# Patient Record
Sex: Female | Born: 1945 | Race: White | Hispanic: No | Smoking: Never smoker
Health system: Southern US, Community
[De-identification: ages and names within clinical notes are randomized; demographics above are authoritative.]

## PROBLEM LIST (undated history)

## (undated) DIAGNOSIS — M25561 Pain in right knee: Secondary | ICD-10-CM

## (undated) DIAGNOSIS — E663 Overweight: Secondary | ICD-10-CM

## (undated) DIAGNOSIS — J45909 Unspecified asthma, uncomplicated: Secondary | ICD-10-CM

## (undated) DIAGNOSIS — E119 Type 2 diabetes mellitus without complications: Secondary | ICD-10-CM

## (undated) DIAGNOSIS — K219 Gastro-esophageal reflux disease without esophagitis: Secondary | ICD-10-CM

## (undated) DIAGNOSIS — M1990 Unspecified osteoarthritis, unspecified site: Secondary | ICD-10-CM

## (undated) DIAGNOSIS — J301 Allergic rhinitis due to pollen: Secondary | ICD-10-CM

## (undated) DIAGNOSIS — R918 Other nonspecific abnormal finding of lung field: Secondary | ICD-10-CM

## (undated) DIAGNOSIS — Z1239 Encounter for other screening for malignant neoplasm of breast: Principal | ICD-10-CM

## (undated) DIAGNOSIS — M545 Low back pain: Secondary | ICD-10-CM

## (undated) DIAGNOSIS — Z8489 Family history of other specified conditions: Secondary | ICD-10-CM

## (undated) DIAGNOSIS — Z0000 Encounter for general adult medical examination without abnormal findings: Secondary | ICD-10-CM

## (undated) DIAGNOSIS — B354 Tinea corporis: Secondary | ICD-10-CM

## (undated) DIAGNOSIS — Z124 Encounter for screening for malignant neoplasm of cervix: Secondary | ICD-10-CM

## (undated) DIAGNOSIS — E1169 Type 2 diabetes mellitus with other specified complication: Secondary | ICD-10-CM

## (undated) DIAGNOSIS — N649 Disorder of breast, unspecified: Secondary | ICD-10-CM

## (undated) DIAGNOSIS — R1310 Dysphagia, unspecified: Secondary | ICD-10-CM

## (undated) DIAGNOSIS — R202 Paresthesia of skin: Secondary | ICD-10-CM

## (undated) DIAGNOSIS — IMO0001 Reserved for inherently not codable concepts without codable children: Secondary | ICD-10-CM

## (undated) DIAGNOSIS — E669 Obesity, unspecified: Secondary | ICD-10-CM

## (undated) DIAGNOSIS — R011 Cardiac murmur, unspecified: Secondary | ICD-10-CM

## (undated) DIAGNOSIS — G5600 Carpal tunnel syndrome, unspecified upper limb: Secondary | ICD-10-CM

## (undated) DIAGNOSIS — C029 Malignant neoplasm of tongue, unspecified: Secondary | ICD-10-CM

## (undated) DIAGNOSIS — G4730 Sleep apnea, unspecified: Secondary | ICD-10-CM

## (undated) DIAGNOSIS — E041 Nontoxic single thyroid nodule: Secondary | ICD-10-CM

## (undated) DIAGNOSIS — M25562 Pain in left knee: Secondary | ICD-10-CM

## (undated) DIAGNOSIS — R200 Anesthesia of skin: Secondary | ICD-10-CM

## (undated) DIAGNOSIS — G4700 Insomnia, unspecified: Secondary | ICD-10-CM

## (undated) DIAGNOSIS — I00 Rheumatic fever without heart involvement: Secondary | ICD-10-CM

## (undated) DIAGNOSIS — K148 Other diseases of tongue: Principal | ICD-10-CM

## (undated) DIAGNOSIS — J40 Bronchitis, not specified as acute or chronic: Secondary | ICD-10-CM

## (undated) DIAGNOSIS — E039 Hypothyroidism, unspecified: Secondary | ICD-10-CM

## (undated) DIAGNOSIS — K589 Irritable bowel syndrome without diarrhea: Secondary | ICD-10-CM

## (undated) HISTORY — DX: Unspecified asthma, uncomplicated: J45.909

## (undated) HISTORY — DX: Other nonspecific abnormal finding of lung field: R91.8

## (undated) HISTORY — DX: Disorder of breast, unspecified: N64.9

## (undated) HISTORY — DX: Irritable bowel syndrome without diarrhea: K58.9

## (undated) HISTORY — DX: Pain in left knee: M25.562

## (undated) HISTORY — DX: Other diseases of tongue: K14.8

## (undated) HISTORY — DX: Malignant neoplasm of tongue, unspecified: C02.9

## (undated) HISTORY — PX: TUBAL LIGATION: SHX77

## (undated) HISTORY — PX: THYROIDECTOMY, PARTIAL: SHX18

## (undated) HISTORY — DX: Allergic rhinitis due to pollen: J30.1

## (undated) HISTORY — DX: Encounter for general adult medical examination without abnormal findings: Z00.00

## (undated) HISTORY — DX: Gastro-esophageal reflux disease without esophagitis: K21.9

## (undated) HISTORY — DX: Pain in right knee: M25.561

## (undated) HISTORY — DX: Insomnia, unspecified: G47.00

## (undated) HISTORY — DX: Carpal tunnel syndrome, unspecified upper limb: G56.00

## (undated) HISTORY — DX: Encounter for other screening for malignant neoplasm of breast: Z12.39

## (undated) HISTORY — DX: Obesity, unspecified: E66.9

## (undated) HISTORY — DX: Overweight: E66.3

## (undated) HISTORY — PX: OTHER SURGICAL HISTORY: SHX169

## (undated) HISTORY — DX: Tinea corporis: B35.4

## (undated) HISTORY — PX: CHOLECYSTECTOMY: SHX55

## (undated) HISTORY — PX: KNEE ARTHROSCOPY: SUR90

## (undated) HISTORY — DX: Encounter for screening for malignant neoplasm of cervix: Z12.4

## (undated) HISTORY — DX: Type 2 diabetes mellitus with other specified complication: E11.69

## (undated) HISTORY — DX: Low back pain: M54.5

## (undated) HISTORY — DX: Nontoxic single thyroid nodule: E04.1

## (undated) HISTORY — DX: Hypothyroidism, unspecified: E03.9

---

## 1998-10-30 ENCOUNTER — Ambulatory Visit (HOSPITAL_COMMUNITY): Admission: RE | Admit: 1998-10-30 | Discharge: 1998-10-30 | Payer: Self-pay | Admitting: Gastroenterology

## 1998-11-28 ENCOUNTER — Encounter: Admission: RE | Admit: 1998-11-28 | Discharge: 1999-02-26 | Payer: Self-pay | Admitting: *Deleted

## 1999-08-21 ENCOUNTER — Other Ambulatory Visit: Admission: RE | Admit: 1999-08-21 | Discharge: 1999-08-21 | Payer: Self-pay | Admitting: *Deleted

## 1999-08-27 ENCOUNTER — Other Ambulatory Visit: Admission: RE | Admit: 1999-08-27 | Discharge: 1999-08-27 | Payer: Self-pay | Admitting: *Deleted

## 1999-08-27 ENCOUNTER — Encounter (INDEPENDENT_AMBULATORY_CARE_PROVIDER_SITE_OTHER): Payer: Self-pay

## 1999-09-05 ENCOUNTER — Other Ambulatory Visit: Admission: RE | Admit: 1999-09-05 | Discharge: 1999-09-05 | Payer: Self-pay | Admitting: *Deleted

## 1999-09-05 ENCOUNTER — Encounter (INDEPENDENT_AMBULATORY_CARE_PROVIDER_SITE_OTHER): Payer: Self-pay

## 2000-02-24 ENCOUNTER — Encounter: Payer: Self-pay | Admitting: *Deleted

## 2000-02-24 ENCOUNTER — Encounter: Admission: RE | Admit: 2000-02-24 | Discharge: 2000-02-24 | Payer: Self-pay | Admitting: *Deleted

## 2000-03-03 ENCOUNTER — Encounter (INDEPENDENT_AMBULATORY_CARE_PROVIDER_SITE_OTHER): Payer: Self-pay

## 2000-03-03 ENCOUNTER — Ambulatory Visit (HOSPITAL_COMMUNITY): Admission: RE | Admit: 2000-03-03 | Discharge: 2000-03-03 | Payer: Self-pay | Admitting: *Deleted

## 2000-10-06 ENCOUNTER — Other Ambulatory Visit: Admission: RE | Admit: 2000-10-06 | Discharge: 2000-10-06 | Payer: Self-pay | Admitting: *Deleted

## 2001-03-19 ENCOUNTER — Emergency Department (HOSPITAL_COMMUNITY): Admission: EM | Admit: 2001-03-19 | Discharge: 2001-03-19 | Payer: Self-pay | Admitting: Emergency Medicine

## 2001-08-14 ENCOUNTER — Encounter: Admission: RE | Admit: 2001-08-14 | Discharge: 2001-11-12 | Payer: Self-pay | Admitting: *Deleted

## 2001-10-09 ENCOUNTER — Other Ambulatory Visit: Admission: RE | Admit: 2001-10-09 | Discharge: 2001-10-09 | Payer: Self-pay | Admitting: *Deleted

## 2002-10-31 ENCOUNTER — Other Ambulatory Visit: Admission: RE | Admit: 2002-10-31 | Discharge: 2002-10-31 | Payer: Self-pay | Admitting: Obstetrics & Gynecology

## 2004-05-27 ENCOUNTER — Other Ambulatory Visit: Admission: RE | Admit: 2004-05-27 | Discharge: 2004-05-27 | Payer: Self-pay | Admitting: Obstetrics & Gynecology

## 2005-06-18 ENCOUNTER — Ambulatory Visit (HOSPITAL_COMMUNITY): Admission: RE | Admit: 2005-06-18 | Discharge: 2005-06-18 | Payer: Self-pay | Admitting: Gastroenterology

## 2008-05-01 ENCOUNTER — Encounter: Admission: RE | Admit: 2008-05-01 | Discharge: 2008-05-01 | Payer: Self-pay | Admitting: Family Medicine

## 2008-05-16 ENCOUNTER — Encounter: Admission: RE | Admit: 2008-05-16 | Discharge: 2008-05-16 | Payer: Self-pay | Admitting: Family Medicine

## 2008-11-27 ENCOUNTER — Encounter: Admission: RE | Admit: 2008-11-27 | Discharge: 2008-11-27 | Payer: Self-pay | Admitting: Endocrinology

## 2008-12-15 ENCOUNTER — Emergency Department (HOSPITAL_COMMUNITY): Admission: EM | Admit: 2008-12-15 | Discharge: 2008-12-15 | Payer: Self-pay | Admitting: Emergency Medicine

## 2009-12-20 ENCOUNTER — Encounter: Payer: Self-pay | Admitting: Family Medicine

## 2010-02-04 ENCOUNTER — Encounter: Admission: RE | Admit: 2010-02-04 | Discharge: 2010-02-04 | Payer: Self-pay | Admitting: Endocrinology

## 2010-06-03 ENCOUNTER — Encounter: Admission: RE | Admit: 2010-06-03 | Discharge: 2010-06-03 | Payer: Self-pay | Admitting: Endocrinology

## 2010-06-17 ENCOUNTER — Encounter: Admission: RE | Admit: 2010-06-17 | Discharge: 2010-06-17 | Payer: Self-pay | Admitting: Endocrinology

## 2010-06-17 ENCOUNTER — Encounter: Payer: Self-pay | Admitting: Family Medicine

## 2010-12-01 ENCOUNTER — Encounter: Payer: Self-pay | Admitting: Family Medicine

## 2011-01-09 ENCOUNTER — Other Ambulatory Visit: Payer: Self-pay | Admitting: Endocrinology

## 2011-01-09 DIAGNOSIS — E049 Nontoxic goiter, unspecified: Secondary | ICD-10-CM

## 2011-01-22 ENCOUNTER — Ambulatory Visit: Admit: 2011-01-22 | Payer: Self-pay | Admitting: Family Medicine

## 2011-01-22 ENCOUNTER — Encounter: Payer: Self-pay | Admitting: Family Medicine

## 2011-01-22 ENCOUNTER — Ambulatory Visit (INDEPENDENT_AMBULATORY_CARE_PROVIDER_SITE_OTHER): Payer: Medicare Other | Admitting: Family Medicine

## 2011-01-22 DIAGNOSIS — K219 Gastro-esophageal reflux disease without esophagitis: Secondary | ICD-10-CM

## 2011-01-22 DIAGNOSIS — J45909 Unspecified asthma, uncomplicated: Secondary | ICD-10-CM

## 2011-01-22 DIAGNOSIS — E039 Hypothyroidism, unspecified: Secondary | ICD-10-CM

## 2011-01-22 DIAGNOSIS — E785 Hyperlipidemia, unspecified: Secondary | ICD-10-CM

## 2011-01-22 DIAGNOSIS — E663 Overweight: Secondary | ICD-10-CM | POA: Insufficient documentation

## 2011-01-22 DIAGNOSIS — E119 Type 2 diabetes mellitus without complications: Secondary | ICD-10-CM

## 2011-01-22 DIAGNOSIS — I10 Essential (primary) hypertension: Secondary | ICD-10-CM | POA: Insufficient documentation

## 2011-01-22 DIAGNOSIS — J301 Allergic rhinitis due to pollen: Secondary | ICD-10-CM

## 2011-01-22 DIAGNOSIS — E782 Mixed hyperlipidemia: Secondary | ICD-10-CM | POA: Insufficient documentation

## 2011-01-22 DIAGNOSIS — M949 Disorder of cartilage, unspecified: Secondary | ICD-10-CM

## 2011-01-22 DIAGNOSIS — Z8679 Personal history of other diseases of the circulatory system: Secondary | ICD-10-CM | POA: Insufficient documentation

## 2011-01-22 DIAGNOSIS — M129 Arthropathy, unspecified: Secondary | ICD-10-CM | POA: Insufficient documentation

## 2011-01-22 DIAGNOSIS — M899 Disorder of bone, unspecified: Secondary | ICD-10-CM | POA: Insufficient documentation

## 2011-01-22 DIAGNOSIS — Z9189 Other specified personal risk factors, not elsewhere classified: Secondary | ICD-10-CM | POA: Insufficient documentation

## 2011-01-22 DIAGNOSIS — B977 Papillomavirus as the cause of diseases classified elsewhere: Secondary | ICD-10-CM | POA: Insufficient documentation

## 2011-01-22 DIAGNOSIS — K589 Irritable bowel syndrome without diarrhea: Secondary | ICD-10-CM | POA: Insufficient documentation

## 2011-01-22 DIAGNOSIS — G4733 Obstructive sleep apnea (adult) (pediatric): Secondary | ICD-10-CM | POA: Insufficient documentation

## 2011-01-22 DIAGNOSIS — E041 Nontoxic single thyroid nodule: Secondary | ICD-10-CM | POA: Insufficient documentation

## 2011-01-22 HISTORY — DX: Unspecified asthma, uncomplicated: J45.909

## 2011-01-22 HISTORY — DX: Irritable bowel syndrome, unspecified: K58.9

## 2011-01-22 HISTORY — DX: Allergic rhinitis due to pollen: J30.1

## 2011-01-22 HISTORY — DX: Overweight: E66.3

## 2011-01-22 HISTORY — DX: Hypothyroidism, unspecified: E03.9

## 2011-01-22 HISTORY — DX: Gastro-esophageal reflux disease without esophagitis: K21.9

## 2011-01-22 HISTORY — DX: Nontoxic single thyroid nodule: E04.1

## 2011-02-01 ENCOUNTER — Encounter: Payer: Self-pay | Admitting: *Deleted

## 2011-02-04 NOTE — Assessment & Plan Note (Signed)
Summary: Est new pt/ dt   Vital Signs:  Patient profile:   65 year old female Height:      66.25 inches (168.28 cm) Weight:      199 pounds (90.45 kg) BMI:     31.99 O2 Sat:      95 % on Room air Temp:     97.8 degrees F (36.56 degrees C) oral Pulse rate:   82 / minute BP sitting:   112 / 72  (right arm)  Vitals Entered By: Josph Macho RMA (January 22, 2011 2:48 PM)  O2 Flow:  Room air CC: Establish new patient/ CF Is Patient Diabetic? Yes   History of Present Illness: patient is a 65 year old Caucasian female in today for new patient appointment. She has a complicated medical history but has been doing relatively well in the recent past. She has a past medical history significant for a diabetes asthma arthritis recurrent bronchitis diverticulosis/IBS, reflux, allergies. In her childhood she reports she was told she had Rheumatic fever in childhood and a heart murmur as a result. She has been told she had episode of jaundice as a child but she is unsure from what.she she also reports as a child she had diphtheria, chickenpox. Paternal respiratory problems asthma and bronchitis are greatly improved with the addition of Advair twice a day as she hasn't had an active bronchitis infection treatment for over a year as a result of starting medication. Prior to that she reports having a couple of episodes of rhonchi his requiring treatment each year. She has been under a great deal of stress caring for multiple family members. She is diagnosed with sleep apnea as a machine. Does note her sugars have been well controlled last year in fact she was on metformin and Actos and Actos was stopped due to her compliance with his diet. Last year she had bone densitometry which was positive for osteopenia. She gets routine mammograms and reports have always been normal. 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 Did have some mild  abnormalities in the past as well but that has also resolved and never required any surgical intervention. She has a history of thyroid disease with some cystic nodules and she is on levothyroxine for hypothyroidism. She denies any fevers, chills, chest pain, congestion, palpitations, shortness of breath, GU concerns. She does note some occasional mild incontinence but this is not new and denies dysuria hematuria urgency or frequency. She does have some trouble intermittently with diarrhea and constipation, no bloody or tarry stool is noted  Preventive Screening-Counseling & Management  Alcohol-Tobacco     Smoking Status: never  Caffeine-Diet-Exercise     Does Patient Exercise: no      Drug Use:  no.    Current Medications (verified): 1)  Advair Diskus 250-50 Mcg/dose Aepb (Fluticasone-Salmeterol) .... Two Times A Day 2)  Levothroid 100 Mcg Tabs (Levothyroxine Sodium) .... Once Daily 3)  Zolpidem Tartrate 10 Mg Tabs (Zolpidem Tartrate) .... Once Daily 4)  Furosemide 20 Mg Tabs (Furosemide) .... As Needed 5)  Metformin Hcl 500 Mg Xr24h-Tab (Metformin Hcl) .... 2 Two Times A Day 6)  Simvastatin 40 Mg Tabs (Simvastatin) .... Once Daily 7)  Vitamin D 1.25 Mg .... Q Week 8)  Alprazolam 0.25 Mg Tabs (Alprazolam) .Marland Kitchen.. 1 Tablet As Needed For Anxiety 9)  Zyrtec Allergy 10 Mg Tabs (Cetirizine Hcl) .... Once Daily 10)  Proventil Hfa 108 (90 Base)  Mcg/act Aers (Albuterol Sulfate) .... As Needed For Wheezing 11)  Amoxicillin 500 Mg Tabs (Amoxicillin) .... Prior To Dental Work 12)  Diphenoxylate- Atropine .... As Needed For Diarrhea  Allergies (verified): 1)  ! Codeine 2)  ! * Bandaid 3)  ! * Tetanus  Past History:  Past Surgical History: right knee arthroscopy Cholecystectomy Cervical fusion, discectomy, metal plate and 2 screws in place Tubal ligation partial thyroidectomy  Family History: Father: 75, DM, HTN, heart disease, paroxysmal afib, sleep apnea Mother: 26, atrial fib, back  pain, arthritis, osteoporosis, CHF Siblings:  Brother: 2, DM MGM: deceased in 52s, cancer MGF: deceased in 66s, DM complications PGM: deceased in 84s, stroke PGF: deceased in 42s, heart disease, stroke Children: None 2 maternal aunts, DM  Social History: Occupation: Air traffic controller  Divorced lives alone Pets, 2 cats Never Smoked Alcohol use-yes, rare special occasions, no intake greater than 10 years Drug use-no Regular exercise-no Wears seat belt regularly No dietary restrictions does minimize dairy intake Occupation:  employed Smoking Status:  never Drug Use:  no Does Patient Exercise:  no  Review of Systems  The patient denies anorexia, fever, weight loss, weight gain, vision loss, decreased hearing, hoarseness, chest pain, syncope, dyspnea on exertion, peripheral edema, prolonged cough, headaches, hemoptysis, abdominal pain, melena, hematochezia, severe indigestion/heartburn, hematuria, incontinence, muscle weakness, suspicious skin lesions, transient blindness, difficulty walking, unusual weight change, abnormal bleeding, and enlarged lymph nodes.    Physical Exam  General:  Well-developed,well-nourished,in no acute distress; alert,appropriate and cooperative throughout examination Head:  Normocephalic and atraumatic without obvious abnormalities. No apparent alopecia or balding. Eyes:  No corneal or conjunctival inflammation noted. EOMI. Perrla Ears:  External ear exam shows no significant lesions or deformities.  Otoscopic examination reveals clear canals, tympanic membranes are intact bilaterally without bulging, retraction, inflammation or discharge. Hearing is grossly normal bilaterally. Nose:  External nasal examination shows no deformity or inflammation. Nasal mucosa are pink and moist without lesions or exudates. Mouth:  Oral mucosa and oropharynx without lesions or exudates.  Neck:  No deformities, masses, or tenderness noted. Lungs:  Normal respiratory effort,  chest expands symmetrically. Lungs are clear to auscultation, no crackles or wheezes. Heart:  Normal rate and regular rhythm. S1 and S2 normal without gallop, murmur, click, rub or other extra sounds. Abdomen:  Bowel sounds positive,abdomen soft and non-tender without masses, organomegaly or hernias noted. Msk:  No deformity or scoliosis noted of thoracic or lumbar spine.   Pulses:  R and L carotid,radial,dorsalis pedis and posterior tibial pulses are full and equal bilaterally Extremities:  No clubbing, cyanosis, edema, or deformity noted with normal full range of motion of all joints.   Neurologic:  No cranial nerve deficits noted. Station and gait are normal. Plantar reflexes are down-going bilaterally. DTRs are symmetrical throughout. Sensory, motor and coordinative functions appear intact. Skin:  Intact without suspicious lesions or rashes Cervical Nodes:  No lymphadenopathy noted Psych:  Cognition and judgment appear intact. Alert and cooperative with normal attention span and concentration. No apparent delusions, illusions, hallucinations   Impression & Recommendations:  Problem # 1:  HYPOTHYROIDISM (ICD-244.9)  Her updated medication list for this problem includes:    Levothroid 100 Mcg Tabs (Levothyroxine sodium) ..... Once daily Patient is given refills until new labs and old recoreds available  Problem # 2:  IRRITABLE BOWEL SYNDROME (ICD-564.1) Start a probiotic and a fiber supplement, maintain adequate hydration  Problem # 3:  HYPERLIPIDEMIA (ICD-272.4)  Her updated medication list for this problem includes:  Simvastatin 40 Mg Tabs (Simvastatin) ..... Once daily Tolerating Simvastatin, no changes until next set of labs is completed, avoid trans fats  Problem # 4:  GERD (ICD-530.81) Avoid offending foods, may use Tums or Zantac as needed   Problem # 5:  SLEEP APNEA, OBSTRUCTIVE (ICD-327.23) Using CPAP  encouraged continued use. discussed risk of not using the  machine.  Problem # 6:  DM (ICD-250.00)  Her updated medication list for this problem includes:    Metformin Hcl 500 Mg Xr24h-tab (Metformin hcl) .Marland Kitchen... 2 two times a day Was showing improved numbers last year so her Actos 30mg  was stopped, will repeat [id number] before making further changes. Avoid simple carbs  Complete Medication List: 1)  Advair Diskus 250-50 Mcg/dose Aepb (Fluticasone-salmeterol) .... Two times a day 2)  Levothroid 100 Mcg Tabs (Levothyroxine sodium) .... Once daily 3)  Zolpidem Tartrate 10 Mg Tabs (Zolpidem tartrate) .... Once daily 4)  Furosemide 20 Mg Tabs (Furosemide) .... As needed 5)  Metformin Hcl 500 Mg Xr24h-tab (Metformin hcl) .... 2 two times a day 6)  Simvastatin 40 Mg Tabs (Simvastatin) .... Once daily 7)  Vitamin D 1.25 Mg  .... Q week 8)  Alprazolam 0.25 Mg Tabs (Alprazolam) .Marland Kitchen.. 1 tablet as needed for anxiety 9)  Zyrtec Allergy 10 Mg Tabs (Cetirizine hcl) .... Once daily 10)  Proventil Hfa 108 (90 Base) Mcg/act Aers (Albuterol sulfate) .... As needed for wheezing 11)  Amoxicillin 500 Mg Tabs (Amoxicillin) .... Prior to dental work 12)  Diphenoxylate- Atropine  .... As needed for diarrhea  Patient Instructions: 1)  Please schedule a follow-up appointment in 1 to 2 months. 2)  Will need labs hgb1c, flp, vit d, renal, liver, cbc, urine microalb 3)  Call with any concerns 4)  Start a probiotic daily such as Align caps and add a fiber supplement such as Benefiber powder 2 tsp by mouth two times a day in food or liquids   Orders Added: 1)  New Patient Level IV [13086]    Preventive Care Screening  Last Flu Shot:    Date:  09/19/2010    Results:  historical   Bone Density:    Date:  12/20/2009    Results:  historcal std dev  Mammogram:    Date:  12/20/2009    Results:  historical   Pap Smear:    Date:  12/20/2009    Results:  historical   Colonoscopy:    Date:  12/20/2004    Results:  historical

## 2011-02-10 NOTE — Letter (Signed)
Summary: 2008-2011  2008-2011   Imported By: Lester [place] 02/02/2011 07:13:43  _____________________________________________________________________  External Attachment:    Type:   Image     Comment:   External Document

## 2011-02-16 NOTE — Letter (Signed)
Summary: 2011 labs Drs Talmage Nap and Merrilee Jansky  2011 labs Drs Talmage Nap and Merrilee Jansky   Imported By: Lester [place] 02/08/2011 07:47:51  _____________________________________________________________________  External Attachment:    Type:   Image     Comment:   External Document

## 2011-05-07 NOTE — Op Note (Signed)
Amanda Carlson, Amanda Carlson             ACCOUNT NO.:  [id number]   MEDICAL RECORD NO.:  [id number]          PATIENT TYPE:  AMB   LOCATION:  ENDO                         FACILITY:  [facility]   PHYSICIAN:  Anselmo Rod, M.D.  DATE OF BIRTH:  October 19, 1946   DATE OF PROCEDURE:  06/18/2005  DATE OF DISCHARGE:                                 OPERATIVE REPORT   PROCEDURES PERFORMED:  Esophagogastroduodenoscopy.   ENDOSCOPIST:  Anselmo Rod, M.D.   INSTRUMENT USED:  Olympus video panendoscope.   INDICATION FOR PROCEDURE:  A 65 year old white female with a history of  dysphagia.  Undergoing an EGD to rule out peptic esophagitis, Barrett's  mucosa, strictures, etc.   PREPROCEDURE PREPARATION:  Informed consent was procured from the patient.  The patient was fasted for eight hours prior to the procedure.   PREPROCEDURE PHYSICAL EXAMINATION:  VITAL SIGNS:  Stable.  NECK:  Supple.  CHEST:  Clear to auscultation.  HEART:  S1 and S2 regular.  ABDOMEN:  Soft with normal bowel sounds.   DESCRIPTION OF PROCEDURE:  The patient was placed in the left lateral  decubitus position and sedated.  No additional sedation was used for the  EGD.  Once the patient was adequately positioned, the Olympus video  panendoscope was advanced through the mouthpiece, over the tongue and into  the esophagus under direct vision.  The entire esophagus appeared widely  patent with no evidence of ring stricture, masses, esophagitis or Barrett's  mucosa.  The scope was then advanced into the stomach where a small hiatal  hernia was seen on high retroflexion.  The rest of the gastric mucosa  appeared normal and so did the proximal small bowel.  The photographs were  lost because of a problem with the printer.   IMPRESSION:  Normal esophagogastroduodenoscopy, except for a small hiatal  hernia.  No esophagitis, Barrett's mucosa or stricture noted.   RECOMMENDATIONS:  1.  Follow antireflux measures.  2.  Avoid  nonsteroidals.  3.  Outpatient followup for further evaluation of abnormal LFTs.       JNM/MEDQ  D:  06/19/2005  T:  06/20/2005  Job:  [id number]   cc:   Sharlot Gowda, M.D.  [address]  [place], [place] [address]  Fax: [phone]

## 2011-05-07 NOTE — Op Note (Signed)
[facility]  Patient:    Amanda Carlson                      MRN: [id number] Proc. Date: 03/03/00 Adm. Date:  [id number] Attending:  Ardeen Fillers CC:         Sung Amabile. Roslyn Smiling, M.D.                           Operative Report  INDICATIONS:                      A 65 year old woman, G0, postmenopausal, receiving hormone replacement therapy for a number of years, with complaints of  persistent intermittent vaginal bleeding.  Endometrial biopsies in the office have been done and shown only scanty proliferative-type endometrium.  Ultrasound was  performed on February 2 and revealed two hyperechoic masses of the endometrium.  Because of the postmenopausal bleeding and endometrial masses on ultrasound, the patient is admitted for operative hysteroscopy to rule out endometrial pathology.  PREOPERATIVE DIAGNOSIS:           Endometrial bleeding.  Endometrial masses on ultrasound.  POSTOPERATIVE DIAGNOSIS:          Endometrial bleeding.  Endometrial masses on ultrasound.  OPERATION:                        Operative hysteroscopy and dilatation and curettage.  SURGEON:                          Sung Amabile. Roslyn Smiling, M.D.  ANESTHESIA:                       General via LMA.  ESTIMATED BLOOD LOSS:             40 cc.  TUBES/DRAINS:                     None.  COMPLICATIONS:                    None.  FINDINGS:                         Anteverted uterus, sounded to 8 cm. Endocervical canal stenotic.  Two endometrial masses seen and removed.  Atrophic-appearing endometrium around the masses.  SPECIMENS:                        Endometrial curettings and endometrial resectoscopic biopsies to pathology.  PROCEDURE:                        After the establishment of general anesthesia, the patient was placed in the dorsal lithotomy position.  The perineum and vagina were prepped with Betadine solution.  The bladder was evacuated with straight catheterization  and the patient was draped.  Examination under anesthesia was performed.  Graves speculum was inserted in the vagina.  The cervix was reprepped with Betadine solution.  The anterior cervical lip was grasped with a single-tooth tenaculum.   Uterus was sounded to 8 cm.  Pratt dilators were used to dilate the cervix to a #33-French.  Operative hysteroscope was introduced into the endocervical canal.  Canal was significantly stenotic.  The scope was removed and the cervix was dilated  to a #35-French.  The scope was reintroduced and photographs were taken.  Endometrial masses were noted.  Using a double-loop/90 degree resectoscopic loop with settings of 190 and 110, cutting and cautery respectively, endometrial biopsies were taken and the masses were excised.  During this case, the tenaculum was replaced on the posterior lip of the cervix in order to provide better retraction and access to the fundal endometrial mass.  Hemostasis was noted.  Operative hysteroscopy instruments were removed.  D&C was then performed.  All instruments were removed and hemostasis as noted.  The patient was extubated without difficulty and returned to the supine  position and transported to the recovery room in satisfactory condition.  Sorbitol was used as a distending medium.  Sorbitol deficit at the end of the case was 190 cc. DD:  03/03/00 TD:  03/03/00 Job: [id number]

## 2011-05-07 NOTE — Op Note (Signed)
Amanda Carlson, Amanda Carlson             ACCOUNT NO.:  [id number]   MEDICAL RECORD NO.:  [id number]          PATIENT TYPE:  AMB   LOCATION:  ENDO                         FACILITY:  [facility]   PHYSICIAN:  Anselmo Rod, M.D.  DATE OF BIRTH:  May 21, 1946   DATE OF PROCEDURE:  06/18/2005  DATE OF DISCHARGE:                                 OPERATIVE REPORT   PROCEDURES PERFORMED:  Screening colonoscopy.   ENDOSCOPIST:  Anselmo Rod, M.D.   INSTRUMENT USED:  Olympus video colonoscope.   INDICATION FOR PROCEDURE:  A 65 year old white female with a family history  of colon cancer in a maternal aunt.  Undergoing a screening colonoscopy to  rule out colonic polyps, masses, etc.   PREPROCEDURE PREPARATION:  Informed consent was procured from the patient.  The patient was fasted for eight hours prior to the procedure and prepped  with a bottle of magnesium citrate and a gallon of GoLYTELY the night prior  to the procedure.  The risks and benefits of the procedure, including a 10%  missed rate of cancer and polyps, were discussed with the patient as well.   PREPROCEDURE PHYSICAL EXAMINATION:  VITAL SIGNS:  Stable.  NECK:  Supple.  CHEST:  Clear to auscultation.  HEART:  S1 and S2 regular.  ABDOMEN:  Obese and nontender with normal bowel sounds.   DESCRIPTION OF PROCEDURE:  The patient was placed in the left lateral  decubitus position and sedated with 120 mg of Demerol and 10 mg of Versed in  slow incremental doses. Once the patient was adequately sedated and  maintained on low-flow oxygen and continuous cardiac monitoring, the Olympus  video colonoscope was advanced from the rectum to the cecum.  There was a  large amount of residual stool in the colon.  Multiple washes were done.  The patient's position was changed from the left lateral to the supine and  the right lateral position with gentle application abdominal pressure to  reach the cecum.  There were scattered diverticula throughout  the colon with  no prominent changes in the sigmoid colon.  The patient had a very tortuous,  atonic colon.  Retroflexion in the rectum revealed small internal  hemorrhoids.  The patient tolerated the procedure well without  complications.  Small lesions could have been missed.  Multiple washes were  done for adequate visualization of the colon.   IMPRESSION:  1.  Pandiverticulosis with more prominent changes in the sigmoid colon.  2.  Very tortuous atonic colon.  Difficult procedure.  3.  Large amount of residual stool in the colon.  Multiple washes done.      Small lesions could have been missed.   RECOMMENDATIONS:  1.  Continue on a high-fiber diet with liberal fluid intake.  Brochures on      diverticulosis have been given to the patient for education.  2.  Proceed with a colonoscopy at this time.  3.  Repeat colonoscopy in the next five years unless the patient develops      any abnormal symptoms in the interim.  4.  Outpatient followup in the next four  for further evaluation of abnormal      LFTs.       JNM/MEDQ  D:  06/19/2005  T:  06/20/2005  Job:  [id number]   cc:   Sharlot Gowda, M.D.  [address]  [place], [place] [address]  Fax: [phone]

## 2011-05-10 ENCOUNTER — Other Ambulatory Visit: Payer: Self-pay

## 2011-05-10 NOTE — Telephone Encounter (Signed)
Please advise refill and quantity. I don't see were we ever prescribed this so not sure on quantity

## 2011-05-10 NOTE — Telephone Encounter (Signed)
OK to refill #30 with 5rf, 1 tab po qhs

## 2011-05-11 MED ORDER — ZOLPIDEM TARTRATE 10 MG PO TABS: 10.0000 mg | ORAL_TABLET | Freq: Every day | ORAL | Status: DC

## 2011-05-11 NOTE — Telephone Encounter (Signed)
Please advise refill? 

## 2011-05-21 ENCOUNTER — Ambulatory Visit
Admission: RE | Admit: 2011-05-21 | Discharge: 2011-05-21 | Disposition: A | Discharge status: Home / Self Care | Payer: BC Managed Care – PPO | Source: Ambulatory Visit | Attending: Endocrinology | Admitting: Endocrinology

## 2011-05-21 DIAGNOSIS — E049 Nontoxic goiter, unspecified: Secondary | ICD-10-CM

## 2011-05-24 ENCOUNTER — Other Ambulatory Visit: Payer: Self-pay

## 2011-06-03 ENCOUNTER — Other Ambulatory Visit: Payer: Medicare Other

## 2011-07-17 ENCOUNTER — Other Ambulatory Visit: Payer: Self-pay | Admitting: Family Medicine

## 2011-07-28 ENCOUNTER — Other Ambulatory Visit: Payer: Self-pay | Admitting: Family Medicine

## 2011-09-24 LAB — BASIC METABOLIC PANEL
CO2: 26 mEq/L (ref 19–32)
Chloride: 106 mEq/L (ref 96–112)
GFR calc non Af Amer: >60 mL/min (ref >60)
Glucose, Bld: 100 mg/dL — ABNORMAL HIGH (ref 70–99)
Potassium: 3.7 mEq/L (ref 3.5–5.1)
Sodium: 141 mEq/L (ref 135–145)

## 2011-09-24 LAB — BASIC METABOLIC PANEL WITH GFR
BUN: 14 mg/dL (ref 6–23)
Calcium: 9 mg/dL (ref 8.4–10.5)
Creatinine, Ser: 0.84 mg/dL (ref 0.4–1.2)
GFR calc Af Amer: >60 mL/min (ref >60)

## 2011-09-24 LAB — D-DIMER, QUANTITATIVE: D-Dimer, Quant: 0.29 ug{FEU}/mL (ref 0.00–0.48)

## 2011-10-13 ENCOUNTER — Ambulatory Visit (HOSPITAL_BASED_OUTPATIENT_CLINIC_OR_DEPARTMENT_OTHER)
Admission: RE | Admit: 2011-10-13 | Discharge: 2011-10-13 | Disposition: A | Discharge status: Home / Self Care | Payer: BC Managed Care – PPO | Source: Ambulatory Visit | Attending: Family Medicine | Admitting: Family Medicine

## 2011-10-13 ENCOUNTER — Ambulatory Visit (INDEPENDENT_AMBULATORY_CARE_PROVIDER_SITE_OTHER): Payer: BC Managed Care – PPO | Admitting: Family Medicine

## 2011-10-13 ENCOUNTER — Encounter: Payer: Self-pay | Admitting: Family Medicine

## 2011-10-13 VITALS — BP 107/75 | HR 75 | Temp 98.1°F | Ht 66.25 in | Wt 206.1 lb

## 2011-10-13 DIAGNOSIS — M25469 Effusion, unspecified knee: Secondary | ICD-10-CM | POA: Insufficient documentation

## 2011-10-13 DIAGNOSIS — M25561 Pain in right knee: Secondary | ICD-10-CM

## 2011-10-13 DIAGNOSIS — M1710 Unilateral primary osteoarthritis, unspecified knee: Secondary | ICD-10-CM | POA: Insufficient documentation

## 2011-10-13 DIAGNOSIS — S8990XA Unspecified injury of unspecified lower leg, initial encounter: Secondary | ICD-10-CM | POA: Insufficient documentation

## 2011-10-13 DIAGNOSIS — S99929A Unspecified injury of unspecified foot, initial encounter: Secondary | ICD-10-CM | POA: Insufficient documentation

## 2011-10-13 DIAGNOSIS — IMO0002 Reserved for concepts with insufficient information to code with codable children: Secondary | ICD-10-CM

## 2011-10-13 DIAGNOSIS — M899 Disorder of bone, unspecified: Secondary | ICD-10-CM | POA: Insufficient documentation

## 2011-10-13 DIAGNOSIS — M25569 Pain in unspecified knee: Secondary | ICD-10-CM

## 2011-10-13 DIAGNOSIS — X500XXA Overexertion from strenuous movement or load, initial encounter: Secondary | ICD-10-CM | POA: Insufficient documentation

## 2011-10-13 MED ORDER — NAPROXEN 375 MG PO TABS: 375.0000 mg | ORAL_TABLET | Freq: Two times a day (BID) | ORAL | Status: DC

## 2011-10-13 MED ORDER — TRAMADOL HCL 50 MG PO TABS: 50.0000 mg | ORAL_TABLET | Freq: Three times a day (TID) | ORAL | Status: DC | PRN

## 2011-10-13 NOTE — Patient Instructions (Signed)
Knee Pain The knee is the complex joint between your thigh and your lower leg. It is made up of bones, tendons, ligaments, and cartilage. The bones that make up the knee are:  The femur in the thigh.   The tibia and fibula in the lower leg.   The patella or kneecap riding in the groove on the lower femur.  CAUSES  Knee pain is a common complaint with many causes. A few of these causes are:  Injury, such as:   A ruptured ligament or tendon injury.   Torn cartilage.   Medical conditions, such as:   Gout   Arthritis   Infections   Overuse, over training or overdoing a physical activity.  Knee pain can be minor or severe. Knee pain can accompany debilitating injury. Minor knee problems often respond well to self-care measures or get well on their own. More serious injuries may need medical intervention or even surgery. SYMPTOMS The knee is complex. Symptoms of knee problems can vary widely. Some of the problems are:  Pain with movement and weight bearing.   Swelling and tenderness.   Buckling of the knee.   Inability to straighten or extend your knee.   Your knee locks and you cannot straighten it.   Warmth and redness with pain and fever.   Deformity or dislocation of the kneecap.  DIAGNOSIS  Determining what is wrong may be very straight forward such as when there is an injury. It can also be challenging because of the complexity of the knee. Tests to make a diagnosis may include:  Your caregiver taking a history and doing a physical exam.   Routine X-rays can be used to rule out other problems. X-rays will not reveal a cartilage tear. Some injuries of the knee can be diagnosed by:   Arthroscopy a surgical technique by which a small video camera is inserted through tiny incisions on the sides of the knee. This procedure is used to examine and repair internal knee joint problems. Tiny instruments can be used during arthroscopy to repair the torn knee cartilage  (meniscus).   Arthrography is a radiology technique. A contrast liquid is directly injected into the knee joint. Internal structures of the knee joint then become visible on X-ray film.   An MRI scan is a non x-ray radiology procedure in which magnetic fields and a computer produce two- or three-dimensional images of the inside of the knee. Cartilage tears are often visible using an MRI scanner. MRI scans have largely replaced arthrography in diagnosing cartilage tears of the knee.   Blood work.   Examination of the fluid that helps to lubricate the knee joint (synovial fluid). This is done by taking a sample out using a needle and a syringe.  TREATMENT The treatment of knee problems depends on the cause. Some of these treatments are:  Depending on the injury, proper casting, splinting, surgery or physical therapy care will be needed.   Give yourself adequate recovery time. Do not overuse your joints. If you begin to get sore during workout routines, back off. Slow down or do fewer repetitions.   For repetitive activities such as cycling or running, maintain your strength and nutrition.   Alternate muscle groups. For example if you are a weight lifter, work the upper body on one day and the lower body the next.   Either tight or weak muscles do not give the proper support for your knee. Tight or weak muscles do not absorb the stress placed   on the knee joint. Keep the muscles surrounding the knee strong.   Take care of mechanical problems.   If you have flat feet, orthotics or special shoes may help. See your caregiver if you need help.   Arch supports, sometimes with wedges on the inner or outer aspect of the heel, can help. These can shift pressure away from the side of the knee most bothered by osteoarthritis.   A brace called an "unloader" brace also may be used to help ease the pressure on the most arthritic side of the knee.   If your caregiver has prescribed crutches, braces,  wraps or ice, use as directed. The acronym for this is PRICE. This means protection, rest, ice, compression and elevation.   Nonsteroidal anti-inflammatory drugs (NSAID's), can help relieve pain. But if taken immediately after an injury, they may actually increase swelling. Take NSAID's with food in your stomach. Stop them if you develop stomach problems. Do not take these if you have a history of ulcers, stomach pain or bleeding from the bowel. Do not take without your caregiver's approval if you have problems with fluid retention, heart failure, or kidney problems.   For ongoing knee problems, physical therapy may be helpful.   Glucosamine and chondroitin are over-the-counter dietary supplements. Both may help relieve the pain of osteoarthritis in the knee. These medicines are different from the usual anti-inflammatory drugs. Glucosamine may decrease the rate of cartilage destruction.   Injections of a corticosteroid drug into your knee joint may help reduce the symptoms of an arthritis flare-up. They may provide pain relief that lasts a few months. You may have to wait a few months between injections. The injections do have a small increased risk of infection, water retention and elevated blood sugar levels.   Hyaluronic acid injected into damaged joints may ease pain and provide lubrication. These injections may work by reducing inflammation. A series of shots may give relief for as long as 6 months.   Topical painkillers. Applying certain ointments to your skin may help relieve the pain and stiffness of osteoarthritis. Ask your pharmacist for suggestions. Many over the-counter products are approved for temporary relief of arthritis pain.   In some countries, doctors often prescribe topical NSAID's for relief of chronic conditions such as arthritis and tendinitis. A review of treatment with NSAID creams found that they worked as well as oral medications but without the serious side effects.    PREVENTION  Maintain a healthy weight. Extra pounds put more strain on your joints.   Get strong, stay limber. Weak muscles are a common cause of knee injuries. Stretching is important. Include flexibility exercises in your workouts.   Be smart about exercise. If you have osteoarthritis, chronic knee pain or recurring injuries, you may need to change the way you exercise. This does not mean you have to stop being active. If your knees ache after jogging or playing basketball, consider switching to swimming, water aerobics or other low-impact activities, at least for a few days a week. Sometimes limiting high-impact activities will provide relief.   Make sure your shoes fit well. Choose footwear that is right for your sport.   Protect your knees. Use the proper gear for knee-sensitive activities. Use kneepads when playing volleyball or laying carpet. Buckle your seat belt every time you drive. Most shattered kneecaps occur in car accidents.   Rest when you are tired.  SEEK MEDICAL CARE IF:  You have knee pain that is continual and does not   seem to be getting better.  SEEK IMMEDIATE MEDICAL CARE IF:  Your knee joint feels hot to the touch and you have a high fever. MAKE SURE YOU:   Understand these instructions.   Will watch your condition.   Will get help right away if you are not doing well or get worse.  Document Released: 10/03/2007 Document Revised: 08/18/2011 Document Reviewed: 10/03/2007 [facility] Patient Information 2012 [place], [place].  Try Naproxen (Aleve), do not take Ibuprofen (advil) on same day but if Naproxen not helpful can switch back to Ibuprofen tomorros  Can take Tramadol (Ultram) with either as needed

## 2011-10-16 ENCOUNTER — Encounter: Payer: Self-pay | Admitting: Family Medicine

## 2011-10-16 DIAGNOSIS — M25562 Pain in left knee: Secondary | ICD-10-CM | POA: Insufficient documentation

## 2011-10-16 DIAGNOSIS — M25561 Pain in right knee: Secondary | ICD-10-CM

## 2011-10-16 HISTORY — DX: Pain in right knee: M25.561

## 2011-10-16 NOTE — Progress Notes (Signed)
Amanda Carlson [id number] February 03, 1946 10/16/2011      Progress Note-Follow Up  Subjective  Chief Complaint  Chief Complaint  Patient presents with  . Leg Injury    fell Monday X 3 days    HPI  Patient is a 63 we'll Caucasian female who fell 3 days ago. She fell and hurt both her knees and her low back. The left knee and the low back have resolved. Unfortunately her right knee twisted the most and is still swollen and painful. It is painful throughout although slightly more anteriorly. Worse with weightbearing. She is walking minimally as a result. No rubs or heave now but she did experience that in the beginning. No chest pain, palpitations, shortness of breath, GI or GU complaints noted. She has not had any pain medications thus far  Past Medical History  Diagnosis Date  . Knee pain, right 10/16/2011    Past Surgical History  Procedure Date  . Knee arthroscopy     right  . Cholecystectomy   . Tubal ligation   . Cervical fusion, discectomy, metal plate and 2 screws in place   . Thyroidectomy, partial     Family History  Problem Relation Age of Onset  . Atrial fibrillation Mother   . Other Mother     Back pain, CHF  . Arthritis Mother   . Osteoporosis Mother   . Heart disease Father   . Hypertension Father   . Diabetes Father   . Sleep apnea Father   . Atrial fibrillation Father     paroxysmal  . Diabetes Brother   . Diabetes Maternal Aunt     X 2 aunts  . Cancer Maternal Grandmother   . Diabetes Maternal Grandfather   . Stroke Paternal Grandmother   . Heart disease Paternal Grandfather   . Stroke Paternal Grandfather     History   Social History  . Marital Status: Divorced    Spouse Name: N/A    Number of Children: N/A  . Years of Education: N/A   Occupational History  . Not on file.   Social History Main Topics  . Smoking status: Never Smoker   . Smokeless tobacco: Never Used  . Alcohol Use: 0.0 oz/week    0 drink(s) per week     No intake of  alcohol in greater than 10 yrs  . Drug Use: No  . Sexually Active: Not on file   Other Topics Concern  . Not on file   Social History Narrative  . No narrative on file    Current Outpatient Prescriptions on File Prior to Visit  Medication Sig Dispense Refill  . ADVAIR DISKUS 250-50 MCG/DOSE AEPB INHALE 1 PUFF AS DIRECTEDTWICE A DAY  1 each  3  . albuterol (PROVENTIL HFA) 108 (90 BASE) MCG/ACT inhaler Inhale 2 puffs into the lungs as needed. For wheezing        . ALPRAZolam (XANAX) 0.25 MG tablet Take 0.25 mg by mouth as needed. For anxiety       . cetirizine (ZYRTEC) 10 MG tablet Take 10 mg by mouth daily.        Marland Kitchen levothyroxine (SYNTHROID, LEVOTHROID) 100 MCG tablet Take 100 mcg by mouth daily.        . metformin (FORTAMET) 500 MG (OSM) 24 hr tablet Take 500 mg by mouth 2 (two) times daily.        . simvastatin (ZOCOR) 40 MG tablet TAKE 1 TABLET BY MOUTH EVERY DAY  30 tablet  5  . VITAMIN D, ERGOCALCIFEROL, PO Take 1.25 mg by mouth once a week.        . zolpidem (AMBIEN) 10 MG tablet Take 1 tablet (10 mg total) by mouth daily.  30 tablet  5  . amoxicillin (AMOXIL) 500 MG tablet Take 500 mg by mouth as needed. Prior to dental work       . diphenoxylate-atropine (LOMOTIL) 2.5-0.025 MG per tablet Take 1 tablet by mouth as needed. For diarrhea       . furosemide (LASIX) 20 MG tablet Take 20 mg by mouth as needed.          Allergies  Allergen Reactions  . Codeine     REACTION: causes itching  . Tetanus Toxoid     REACTION: NO Tetanus shot    Review of Systems  Review of Systems  Constitutional: Negative for fever and malaise/fatigue.  HENT: Negative for congestion.   Eyes: Negative for discharge.  Respiratory: Negative for shortness of breath.   Cardiovascular: Negative for chest pain, palpitations and leg swelling.  Gastrointestinal: Negative for nausea, abdominal pain and diarrhea.  Genitourinary: Negative for dysuria.  Musculoskeletal: Positive for joint pain and falls.         The fall was the result of catching her foot on a threshold. Right knee continues to hurt  Skin: Negative for rash.  Neurological: Negative for loss of consciousness and headaches.  Endo/Heme/Allergies: Negative for polydipsia.  Psychiatric/Behavioral: Negative for depression and suicidal ideas. The patient is not nervous/anxious and does not have insomnia.     Objective  BP 107/75  Pulse 75  Temp(Src) 98.1 F (36.7 C) (Oral)  Ht 5' 6.25" (1.683 m)  Wt 206 lb 1.9 oz (93.495 kg)  BMI 33.02 kg/m2  SpO2 98%  Physical Exam  Physical Exam  Constitutional: She is oriented to person, place, and time and well-developed, well-nourished, and in no distress. No distress.  HENT:  Head: Normocephalic and atraumatic.  Eyes: Conjunctivae are normal.  Neck: Neck supple. No thyromegaly present.  Cardiovascular: Normal rate, regular rhythm and normal heart sounds.   No murmur heard. Pulmonary/Chest: Effort normal and breath sounds normal. She has no wheezes.  Abdominal: She exhibits no distension and no mass.  Musculoskeletal: She exhibits edema and tenderness.       Right knee is diffusely swollen, tender to touch, no ligamentous laxity or significant tenderness over either meniscus  Lymphadenopathy:    She has no cervical adenopathy.  Neurological: She is alert and oriented to person, place, and time.  Skin: Skin is warm and dry. No rash noted. She is not diaphoretic.  Psychiatric: Memory, affect and judgment normal.    No results found for this basename: TSH   No results found for this basename: WBC, HGB, HCT, MCV, PLT   Lab Results  Component Value Date   CREATININE 0.84 12/15/2008   BUN 14 12/15/2008   NA 141 12/15/2008   K 3.7 12/15/2008   CL 106 12/15/2008   CO2 26 12/15/2008      Assessment & Plan  Knee pain, right Patient fell 3 days ago for walking down some stairs twisted her right knee out and has had pain swelling and warmth since then. She also hurt her  left knee and her back with those symptoms have resolved. She is able to bear weight but very minimally. No numbness tingling or weakness noted to the foot. She has injured that knee in the past. He did have to have  some surgical debridement in that knee 5 years ago. Will proceed with x-ray. She is asked to minimize weightbearing. She is offered a referral to orthopedics would like to wait for now. Is given some pain medications to use sparingly and she will notify us if symptoms persist for further referral to

## 2011-10-16 NOTE — Assessment & Plan Note (Signed)
Patient fell 3 days ago for walking down some stairs twisted her right knee out and has had pain swelling and warmth since then. She also hurt her left knee and her back with those symptoms have resolved. She is able to bear weight but very minimally. No numbness tingling or weakness noted to the foot. She has injured that knee in the past. He did have to have some surgical debridement in that knee 5 years ago. Will proceed with x-ray. She is asked to minimize weightbearing. She is offered a referral to orthopedics would like to wait for now. Is given some pain medications to use sparingly and she will notify us if symptoms persist for further referral to

## 2011-11-03 ENCOUNTER — Telehealth: Payer: Self-pay | Admitting: Family Medicine

## 2011-11-03 NOTE — Telephone Encounter (Signed)
Please advise? Pt is at [facility]

## 2011-11-03 NOTE — Telephone Encounter (Signed)
Patient is at [facility] requesting diagnostic bilateral mammo for right nipple discharge, can you put in an order?

## 2011-11-03 NOTE — Telephone Encounter (Signed)
Order signed.

## 2011-11-12 ENCOUNTER — Other Ambulatory Visit: Payer: Self-pay

## 2011-11-12 MED ORDER — ZOLPIDEM TARTRATE 10 MG PO TABS: 10.0000 mg | ORAL_TABLET | Freq: Every day | ORAL | Status: DC

## 2011-11-12 NOTE — Telephone Encounter (Signed)
Please advise 

## 2011-11-25 ENCOUNTER — Other Ambulatory Visit: Payer: Self-pay

## 2011-11-25 ENCOUNTER — Encounter: Payer: Self-pay | Admitting: Family Medicine

## 2011-11-25 MED ORDER — ALPRAZOLAM 0.25 MG PO TABS: 0.2500 mg | ORAL_TABLET | Freq: Every day | ORAL | Status: DC | PRN

## 2011-11-25 NOTE — Telephone Encounter (Signed)
Please advise refill? 

## 2012-01-02 ENCOUNTER — Other Ambulatory Visit: Payer: Self-pay | Admitting: Family Medicine

## 2012-01-26 ENCOUNTER — Other Ambulatory Visit: Payer: Self-pay

## 2012-01-26 MED ORDER — FUROSEMIDE 20 MG PO TABS: 20.0000 mg | ORAL_TABLET | ORAL | Status: DC | PRN

## 2012-02-21 ENCOUNTER — Other Ambulatory Visit: Payer: Self-pay | Admitting: Family Medicine

## 2012-03-30 ENCOUNTER — Other Ambulatory Visit: Payer: Self-pay | Admitting: Endocrinology

## 2012-03-30 DIAGNOSIS — E119 Type 2 diabetes mellitus without complications: Secondary | ICD-10-CM | POA: Diagnosis not present

## 2012-03-30 DIAGNOSIS — E559 Vitamin D deficiency, unspecified: Secondary | ICD-10-CM | POA: Diagnosis not present

## 2012-03-30 DIAGNOSIS — E041 Nontoxic single thyroid nodule: Secondary | ICD-10-CM

## 2012-03-30 DIAGNOSIS — E039 Hypothyroidism, unspecified: Secondary | ICD-10-CM | POA: Diagnosis not present

## 2012-03-30 DIAGNOSIS — M810 Age-related osteoporosis without current pathological fracture: Secondary | ICD-10-CM | POA: Diagnosis not present

## 2012-04-24 ENCOUNTER — Other Ambulatory Visit: Payer: Self-pay

## 2012-04-24 MED ORDER — VITAMIN D (ERGOCALCIFEROL) 1.25 MG (50000 UNIT) PO CAPS: 50000.0000 [IU] | ORAL_CAPSULE | ORAL | Status: DC

## 2012-06-05 ENCOUNTER — Other Ambulatory Visit: Payer: Self-pay

## 2012-06-05 MED ORDER — ALBUTEROL SULFATE HFA 108 (90 BASE) MCG/ACT IN AERS: 2.0000 | INHALATION_SPRAY | RESPIRATORY_TRACT | Status: DC | PRN

## 2012-06-05 MED ORDER — FLUTICASONE-SALMETEROL 250-50 MCG/DOSE IN AEPB: 1.0000 | INHALATION_SPRAY | Freq: Once | RESPIRATORY_TRACT | Status: DC

## 2012-06-05 MED ORDER — ALPRAZOLAM 0.25 MG PO TABS: 0.2500 mg | ORAL_TABLET | Freq: Every day | ORAL | Status: DC | PRN

## 2012-06-05 NOTE — Telephone Encounter (Signed)
Please advise Alprazolam refill? Last RX wrote on 11-25-11 quantity 30 with 2 refills

## 2012-06-07 ENCOUNTER — Encounter: Payer: Self-pay | Admitting: Family Medicine

## 2012-06-07 ENCOUNTER — Ambulatory Visit (INDEPENDENT_AMBULATORY_CARE_PROVIDER_SITE_OTHER): Payer: Medicare Other | Admitting: Family Medicine

## 2012-06-07 VITALS — BP 128/88 | HR 74 | Temp 97.4°F | Ht 66.25 in | Wt 208.1 lb

## 2012-06-07 DIAGNOSIS — G4733 Obstructive sleep apnea (adult) (pediatric): Secondary | ICD-10-CM | POA: Diagnosis not present

## 2012-06-07 DIAGNOSIS — M129 Arthropathy, unspecified: Secondary | ICD-10-CM | POA: Diagnosis not present

## 2012-06-07 DIAGNOSIS — E119 Type 2 diabetes mellitus without complications: Secondary | ICD-10-CM

## 2012-06-07 DIAGNOSIS — G4700 Insomnia, unspecified: Secondary | ICD-10-CM | POA: Insufficient documentation

## 2012-06-07 DIAGNOSIS — M25569 Pain in unspecified knee: Secondary | ICD-10-CM

## 2012-06-07 DIAGNOSIS — M949 Disorder of cartilage, unspecified: Secondary | ICD-10-CM | POA: Diagnosis not present

## 2012-06-07 DIAGNOSIS — E663 Overweight: Secondary | ICD-10-CM

## 2012-06-07 DIAGNOSIS — M899 Disorder of bone, unspecified: Secondary | ICD-10-CM

## 2012-06-07 DIAGNOSIS — K589 Irritable bowel syndrome without diarrhea: Secondary | ICD-10-CM

## 2012-06-07 DIAGNOSIS — B354 Tinea corporis: Secondary | ICD-10-CM

## 2012-06-07 DIAGNOSIS — M25562 Pain in left knee: Secondary | ICD-10-CM

## 2012-06-07 DIAGNOSIS — M8580 Other specified disorders of bone density and structure, unspecified site: Secondary | ICD-10-CM

## 2012-06-07 DIAGNOSIS — E785 Hyperlipidemia, unspecified: Secondary | ICD-10-CM

## 2012-06-07 DIAGNOSIS — E039 Hypothyroidism, unspecified: Secondary | ICD-10-CM

## 2012-06-07 DIAGNOSIS — M25561 Pain in right knee: Secondary | ICD-10-CM

## 2012-06-07 DIAGNOSIS — G5600 Carpal tunnel syndrome, unspecified upper limb: Secondary | ICD-10-CM | POA: Insufficient documentation

## 2012-06-07 HISTORY — DX: Tinea corporis: B35.4

## 2012-06-07 HISTORY — DX: Carpal tunnel syndrome, unspecified upper limb: G56.00

## 2012-06-07 HISTORY — DX: Insomnia, unspecified: G47.00

## 2012-06-07 MED ORDER — NAPROXEN 375 MG PO TABS: 375.0000 mg | ORAL_TABLET | Freq: Every day | ORAL | Status: DC | PRN

## 2012-06-07 MED ORDER — TRAMADOL HCL 50 MG PO TABS: 50.0000 mg | ORAL_TABLET | Freq: Two times a day (BID) | ORAL | Status: DC | PRN

## 2012-06-07 NOTE — Assessment & Plan Note (Signed)
Densitometry scan ordered today

## 2012-06-07 NOTE — Assessment & Plan Note (Signed)
Recently had her Synthroid dropped to 

## 2012-06-07 NOTE — Assessment & Plan Note (Signed)
Was diagnosed many years ago and continues to benefit from therapy. She uses her CPAP nightly and notes she feels much better and has much more energy since starting therapy. She was not tolerating a full mask so they switched her to a nasal mask/pillow and she feels this is better. She uses [facility] for all of her CPAP needs so we will contack them to order her a new machine.

## 2012-06-07 NOTE — Assessment & Plan Note (Signed)
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 Is encouraged to try Benadryl prn

## 2012-06-07 NOTE — Assessment & Plan Note (Signed)
Describes stiffness and pain diffusely especially after prolonged immobility. Needs to start exercising daily and get up frequently when she is doing sedentary work. Naproxen, Aspercreme and Tramadol prn

## 2012-06-07 NOTE — Patient Instructions (Addendum)
Carpal Tunnel Release Carpal tunnel release is done to relieve the pressure on the nerves and tendons on the bottom side of your wrist.  LET YOUR CAREGIVER KNOW ABOUT:   Allergies to food or medicine.   Medicines taken, including vitamins, herbs, eyedrops, over-the-counter medicines, and creams.   Use of steroids (by mouth or creams).   Previous problems with anesthetics or numbing medicines.   History of bleeding problems or blood clots.   Previous surgery.   Other health problems, including diabetes and kidney problems.   Possibility of pregnancy, if this applies.  RISKS AND COMPLICATIONS  Some problems that may happen after this procedure include:  Infection.   Damage to the nerves, arteries or tendons could occur. This would be very uncommon.   Bleeding.  BEFORE THE PROCEDURE   This surgery may be done while you are asleep (general anesthetic) or may be done under a block where only your forearm and the surgical area is numb.   If the surgery is done under a block, the numbness will gradually wear off within several hours after surgery.  HOME CARE INSTRUCTIONS   Have a responsible person with you for 24 hours.   Do not drive a car or use public transportation for 24 hours.   Only take over-the-counter or prescription medicines for pain, discomfort, or fever as directed by your caregiver. Take them as directed.   You may put ice on the palm side of the affected wrist.   Put ice in a plastic bag.   Place a towel between your skin and the bag.   Leave the ice on for 20 to 30 minutes, 4 times per day.   If you were given a splint to keep your wrist from bending, use it as directed. It is important to wear the splint at night or as directed. Use the splint for as long as you have pain or numbness in your hand, arm, or wrist. This may take 1 to 2 months.   Keep your hand raised (elevated) above the level of your heart as much as possible. This keeps swelling down and  helps with discomfort.   Change bandages (dressings) as directed.   Keep the wound clean and dry.  SEEK MEDICAL CARE IF:   You develop pain not relieved with medications.   You develop numbness of your hand.   You develop bleeding from your surgical site.   You have an oral temperature above 102 F (38.9 C).   You develop redness or swelling of the surgical site.   You develop new, unexplained problems.  SEEK IMMEDIATE MEDICAL CARE IF:   You develop a rash.   You have difficulty breathing.   You develop any reaction or side effects to medications given.  Document Released: 02/26/2004 Document Revised: 11/25/2011 Document Reviewed: 10/12/2007 [facility] Patient Information 2012 [place], [place].  For CTS ice for 15 minutes once to twice daily then apply aspercreme and place wrist splints at bed May alternate Ibupprofen and Naproxen every other day if you find that helpful. Use Tramadol for severe pain and notify us if no relief  Start MegaRed krill oil caps by Schiff once daily for joints and cholesterol A probiotic daily such as Science writer daily Start Metamucil daily  For the left arm lesion treat with Lotrimin cream twice a day for a month or so if no improvement see dermatology  Try Benadryl/Diphenhydramine 25 to 50 mg at bed time (1-2 tabs) to see if that help sleep  Try Aspercreme for calf pain as well

## 2012-06-07 NOTE — Progress Notes (Signed)
Patient ID: Amanda Carlson, female   DOB: Jul 04, 1946, 66 y.o.   MRN: [id number] CATIA TODOROV [id number] 1946/02/21 06/07/2012      Progress Note-Follow Up  Subjective  Chief Complaint  Chief Complaint  Patient presents with  . discuss CPAP    HPI  Patient is in today with numerous concerns. First of all she needs her CPAP machine reordered. She's been on a CPAP machine for many years it has been over 5 years since he had a new machine. She uses it at home care and is in need of new mass, tubing, filters as well as the machine itself. Has recently switched to nasal pillow from a full mask and is much happier with that. Has had some trouble sleeping this week and cares for her elderly 58 something-year-old parents so she is concerned about taking any stronger meds, Ambien has not been working well lately. She has recently tired LC she's been sitting more and is complaining of increased stiffness and joint pain. Has trouble in both knees the right is worse than left. She has stiffness and pain in both for [facility] in both of her wrist. Has overall stiffness after any prolonged sitting. She has calf pain occasionally as well. No swelling or redness. She is been taking ibuprofen intermittently for the pain without any great results. No falls. Continues to follow with endocrinology, Dr. Talmage Nap for her thyroid, cholesterol and diabetes. Reports these are all well controlled. He is in need of her next bone densitometry scan.  Past Medical History  Diagnosis Date  . Knee pain, right 10/16/2011  . Knee pain, bilateral 10/16/2011  . HYPOTHYROIDISM 01/22/2011    Qualifier: Diagnosis of  By: Abner Greenspan MD, Claria Dice with Dr Talmage Nap   . Overweight 01/22/2011    Qualifier: Diagnosis of  By: Abner Greenspan MD, Misty Stanley    . Intrinsic asthma, unspecified 01/22/2011    Qualifier: Diagnosis of  By: Abner Greenspan MD, Misty Stanley    . THYROID CYST 01/22/2011    Qualifier: Diagnosis of  By: Abner Greenspan MD, Misty Stanley    . ALLERGIC RHINITIS,  SEASONAL 01/22/2011    Qualifier: Diagnosis of  By: Abner Greenspan MD, Misty Stanley    . GERD 01/22/2011    Qualifier: Diagnosis of  By: Abner Greenspan MD, Misty Stanley    . Irritable bowel syndrome 01/22/2011    Qualifier: Diagnosis of  By: Abner Greenspan MD, Misty Stanley    . CTS (carpal tunnel syndrome) 06/07/2012    Past Surgical History  Procedure Date  . Knee arthroscopy     right  . Cholecystectomy   . Tubal ligation   . Cervical fusion, discectomy, metal plate and 2 screws in place   . Thyroidectomy, partial     Family History  Problem Relation Age of Onset  . Atrial fibrillation Mother   . Other Mother     Back pain, CHF  . Arthritis Mother   . Osteoporosis Mother   . Heart disease Father   . Hypertension Father   . Diabetes Father   . Sleep apnea Father   . Atrial fibrillation Father     paroxysmal  . Diabetes Brother   . Diabetes Maternal Aunt     X 2 aunts  . Cancer Maternal Grandmother   . Diabetes Maternal Grandfather   . Stroke Paternal Grandmother   . Heart disease Paternal Grandfather   . Stroke Paternal Grandfather     History   Social History  . Marital Status: Divorced    Spouse Name: N/A  Number of Children: N/A  . Years of Education: N/A   Occupational History  . Not on file.   Social History Main Topics  . Smoking status: Never Smoker   . Smokeless tobacco: Never Used  . Alcohol Use: 0.0 oz/week    0 drink(s) per week     No intake of alcohol in greater than 10 yrs  . Drug Use: No  . Sexually Active: Not on file   Other Topics Concern  . Not on file   Social History Narrative  . No narrative on file    Current Outpatient Prescriptions on File Prior to Visit  Medication Sig Dispense Refill  . albuterol (PROVENTIL HFA) 108 (90 BASE) MCG/ACT inhaler Inhale 2 puffs into the lungs as needed. For wheezing  2 Inhaler  2  . ALPRAZolam (XANAX) 0.25 MG tablet Take 1 tablet (0.25 mg total) by mouth daily as needed for sleep or anxiety. For anxiety  30 tablet  2  . Calcium  Carbonate-Vitamin D (CALTRATE 600+D PO) Take 3 tablets by mouth daily.      . cetirizine (ZYRTEC) 10 MG tablet Take 10 mg by mouth daily.        . Fluticasone-Salmeterol (ADVAIR DISKUS) 250-50 MCG/DOSE AEPB Inhale 1 puff into the lungs once.  60 each  2  . furosemide (LASIX) 20 MG tablet Take 1 tablet (20 mg total) by mouth as needed.  30 tablet  5  . levothyroxine (SYNTHROID, LEVOTHROID) 100 MCG tablet Take 100 mcg by mouth daily.        . metformin (FORTAMET) 500 MG (OSM) 24 hr tablet Take 500 mg by mouth 2 (two) times daily.        . simvastatin (ZOCOR) 40 MG tablet TAKE 1 TABLET BY MOUTH EVERY DAY  30 tablet  5  . VITAMIN D, ERGOCALCIFEROL, PO Take 1.25 mg by mouth once a week.        Marland Kitchen amoxicillin (AMOXIL) 500 MG tablet Take 500 mg by mouth as needed. Prior to dental work         Allergies  Allergen Reactions  . Codeine     REACTION: causes itching  . Tetanus Toxoid     REACTION: NO Tetanus shot    Review of Systems  Review of Systems  Constitutional: Negative for fever and malaise/fatigue.  HENT: Negative for congestion.   Eyes: Negative for discharge.  Respiratory: Negative for shortness of breath.   Cardiovascular: Negative for chest pain, palpitations and leg swelling.  Gastrointestinal: Positive for heartburn. Negative for nausea, abdominal pain and diarrhea.  Genitourinary: Negative for dysuria.  Musculoskeletal: Positive for myalgias and joint pain. Negative for falls.  Skin: Positive for rash.       Left arm lesion a couple of months. Scaly and not obviously enlarging. No pruritus, does have a dermatologist  Neurological: Negative for loss of consciousness and headaches.  Endo/Heme/Allergies: Negative for polydipsia.  Psychiatric/Behavioral: Negative for depression and suicidal ideas. The patient is not nervous/anxious and does not have insomnia.     Objective  BP 128/88  Pulse 74  Temp 97.4 F (36.3 C) (Temporal)  Ht 5' 6.25" (1.683 m)  Wt 208 lb 1.9 oz  (94.403 kg)  BMI 33.34 kg/m2  SpO2 97%  Physical Exam  Physical Exam  Constitutional: She is oriented to person, place, and time and well-developed, well-nourished, and in no distress. No distress.  HENT:  Head: Normocephalic and atraumatic.  Eyes: Conjunctivae are normal.  Neck: Neck supple. No  thyromegaly present.  Cardiovascular: Normal rate, regular rhythm and normal heart sounds.   No murmur heard. Pulmonary/Chest: Effort normal and breath sounds normal. She has no wheezes.  Abdominal: She exhibits no distension and no mass.  Musculoskeletal: She exhibits no edema.  Lymphadenopathy:    She has no cervical adenopathy.  Neurological: She is alert and oriented to person, place, and time.  Skin: Skin is warm and dry. Rash noted. She is not diaphoretic.       1 cm flesh colored, hint of erythema? Lesion left arm. Scaly.  Psychiatric: Memory, affect and judgment normal.    No results found for this basename: TSH   No results found for this basename: WBC, HGB, HCT, MCV, PLT   Lab Results  Component Value Date   CREATININE 0.84 12/15/2008   BUN 14 12/15/2008   NA 141 12/15/2008   K 3.7 12/15/2008   CL 106 12/15/2008   CO2 26 12/15/2008     Assessment & Plan  SLEEP APNEA, OBSTRUCTIVE Was diagnosed many years ago and continues to benefit from therapy. She uses her CPAP nightly and notes she feels much better and has much more energy since starting therapy. She was not tolerating a full mask so they switched her to a nasal mask/pillow and she feels this is better. She uses [facility] for all of her CPAP needs so we will contack them to order her a new machine.   Knee pain, bilateral Start MegaRed caps daily, Naproxen in am and Tramadol prn for severe pain. Needs to start a regular exercise regimen as well.  ARTHRITIS, CHRONIC Describes stiffness and pain diffusely especially after prolonged immobility. Needs to start exercising daily and get up frequently when she  is doing sedentary work. Naproxen, Aspercreme and Tramadol prn  HYPERLIPIDEMIA Will try and get a hold of a copy of labs. No change in meds for now  DM Reports last hgba1c was 6 is due for labs soon, she is asked to have them forwarded to [place]  HYPOTHYROIDISM Recently had her Synthroid dropped to  OVERWEIGHT Given handout on DASH diet and encouraged to add exercise daily  IRRITABLE BOWEL SYNDROME Add Metamucil and probiotics  CTS (carpal tunnel syndrome) Encouraged ice and aspercreme bid and wear wrist splints at least qhs  Tinea corporis Vs an AK. She is encouraged to apply Lotrimin bid and if no improvement in a month is encouraged to see her Dermatologist, Dr Margo Aye for further evaluation  OSTEOPENIA Densitometry scan ordered today  Insomnia Ambien not working recently and concerned about being too sedated as she helps out her elderly parents. Is encouraged to try Benadryl prn

## 2012-06-07 NOTE — Assessment & Plan Note (Signed)
Encouraged ice and aspercreme bid and wear wrist splints at least qhs

## 2012-06-07 NOTE — Assessment & Plan Note (Signed)
Start MegaRed caps daily, Naproxen in am and Tramadol prn for severe pain. Needs to start a regular exercise regimen as well.

## 2012-06-07 NOTE — Assessment & Plan Note (Signed)
Given handout on DASH diet and encouraged to add exercise daily

## 2012-06-07 NOTE — Assessment & Plan Note (Signed)
Reports last hgba1c was 6 is due for labs soon, she is asked to have them forwarded to [place]

## 2012-06-07 NOTE — Assessment & Plan Note (Signed)
Add Metamucil and probiotics

## 2012-06-07 NOTE — Assessment & Plan Note (Signed)
Vs an AK. She is encouraged to apply Lotrimin bid and if no improvement in a month is encouraged to see her Dermatologist, Dr Margo Aye for further evaluation

## 2012-06-07 NOTE — Assessment & Plan Note (Signed)
Will try and get a hold of a copy of labs. No change in meds for now

## 2012-06-08 ENCOUNTER — Telehealth: Payer: Self-pay | Admitting: Family Medicine

## 2012-06-08 NOTE — Telephone Encounter (Signed)
Patient would like her CPAP order to be sent to [facility]

## 2012-06-08 NOTE — Telephone Encounter (Signed)
Waiting on Melissa at [facility] to return our call

## 2012-06-09 NOTE — Telephone Encounter (Signed)
Called [facility] at [facility] and she states she will look today for patients information and call me back

## 2012-06-09 NOTE — Telephone Encounter (Signed)
Amanda Carlson is calling to discuss options with the patient

## 2012-06-12 ENCOUNTER — Telehealth: Payer: Self-pay

## 2012-06-12 NOTE — Telephone Encounter (Signed)
Written on prescription pad as instructed

## 2012-06-12 NOTE — Telephone Encounter (Signed)
Faxed to [facility].

## 2012-06-12 NOTE — Telephone Encounter (Signed)
Needs to be on script pad with the following information: Name, dob, replacement CPAP and supplies at current settings, diagnosis, Dr signature and NPI #   Fax: [phone] att: Karle Plumber

## 2012-06-13 NOTE — Telephone Encounter (Signed)
Patient called back to check to see if Rx had been sent to [facility]. Advised patient that it had & that I would check with [facility]. I contacted Melissa & she will check on status of CPAP and contact patient today.

## 2012-06-13 NOTE — Telephone Encounter (Signed)
Patient aware.

## 2012-06-15 ENCOUNTER — Ambulatory Visit
Admission: RE | Admit: 2012-06-15 | Discharge: 2012-06-15 | Disposition: A | Discharge status: Home / Self Care | Payer: Medicare Other | Source: Ambulatory Visit | Attending: Endocrinology | Admitting: Endocrinology

## 2012-06-15 DIAGNOSIS — E119 Type 2 diabetes mellitus without complications: Secondary | ICD-10-CM | POA: Diagnosis not present

## 2012-06-15 DIAGNOSIS — E039 Hypothyroidism, unspecified: Secondary | ICD-10-CM | POA: Diagnosis not present

## 2012-06-15 DIAGNOSIS — E7800 Pure hypercholesterolemia, unspecified: Secondary | ICD-10-CM | POA: Diagnosis not present

## 2012-06-15 DIAGNOSIS — E559 Vitamin D deficiency, unspecified: Secondary | ICD-10-CM | POA: Diagnosis not present

## 2012-06-15 DIAGNOSIS — E041 Nontoxic single thyroid nodule: Secondary | ICD-10-CM

## 2012-06-20 ENCOUNTER — Other Ambulatory Visit: Payer: Self-pay | Admitting: Endocrinology

## 2012-06-20 DIAGNOSIS — E041 Nontoxic single thyroid nodule: Secondary | ICD-10-CM

## 2012-06-20 DIAGNOSIS — E119 Type 2 diabetes mellitus without complications: Secondary | ICD-10-CM | POA: Diagnosis not present

## 2012-06-20 DIAGNOSIS — E049 Nontoxic goiter, unspecified: Secondary | ICD-10-CM

## 2012-06-20 DIAGNOSIS — E039 Hypothyroidism, unspecified: Secondary | ICD-10-CM | POA: Diagnosis not present

## 2012-06-20 DIAGNOSIS — E559 Vitamin D deficiency, unspecified: Secondary | ICD-10-CM | POA: Diagnosis not present

## 2012-06-20 DIAGNOSIS — M810 Age-related osteoporosis without current pathological fracture: Secondary | ICD-10-CM | POA: Diagnosis not present

## 2012-06-26 ENCOUNTER — Other Ambulatory Visit: Payer: BC Managed Care – PPO

## 2012-06-28 ENCOUNTER — Ambulatory Visit
Admission: RE | Admit: 2012-06-28 | Discharge: 2012-06-28 | Disposition: A | Discharge status: Home / Self Care | Payer: Medicare Other | Source: Ambulatory Visit | Attending: Endocrinology | Admitting: Endocrinology

## 2012-06-28 ENCOUNTER — Other Ambulatory Visit (HOSPITAL_COMMUNITY)
Admission: RE | Admit: 2012-06-28 | Discharge: 2012-06-28 | Disposition: A | Discharge status: Home / Self Care | Payer: Medicare Other | Source: Ambulatory Visit | Attending: Diagnostic Radiology | Admitting: Diagnostic Radiology

## 2012-06-28 DIAGNOSIS — E041 Nontoxic single thyroid nodule: Secondary | ICD-10-CM | POA: Diagnosis not present

## 2012-06-28 DIAGNOSIS — E049 Nontoxic goiter, unspecified: Secondary | ICD-10-CM | POA: Diagnosis not present

## 2012-07-31 ENCOUNTER — Other Ambulatory Visit: Payer: Self-pay

## 2012-07-31 DIAGNOSIS — M25561 Pain in right knee: Secondary | ICD-10-CM

## 2012-07-31 MED ORDER — TRAMADOL HCL 50 MG PO TABS: 50.0000 mg | ORAL_TABLET | Freq: Two times a day (BID) | ORAL | Status: DC | PRN

## 2012-07-31 MED ORDER — NAPROXEN 375 MG PO TABS: 375.0000 mg | ORAL_TABLET | Freq: Every day | ORAL | Status: DC | PRN

## 2012-08-15 ENCOUNTER — Other Ambulatory Visit: Payer: Self-pay

## 2012-08-15 MED ORDER — SIMVASTATIN 40 MG PO TABS: 40.0000 mg | ORAL_TABLET | Freq: Every day | ORAL | Status: DC

## 2012-08-16 ENCOUNTER — Other Ambulatory Visit: Payer: Self-pay

## 2012-08-16 MED ORDER — SIMVASTATIN 40 MG PO TABS: 40.0000 mg | ORAL_TABLET | Freq: Every day | ORAL | Status: DC

## 2012-09-21 DIAGNOSIS — Z23 Encounter for immunization: Secondary | ICD-10-CM | POA: Diagnosis not present

## 2012-10-03 ENCOUNTER — Other Ambulatory Visit: Payer: Self-pay | Admitting: Family Medicine

## 2012-10-03 ENCOUNTER — Telehealth: Payer: Self-pay

## 2012-10-03 DIAGNOSIS — Z13820 Encounter for screening for osteoporosis: Secondary | ICD-10-CM

## 2012-10-03 NOTE — Telephone Encounter (Signed)
Bone density ordered and printed to be faxed to [facility]

## 2012-10-03 NOTE — Telephone Encounter (Signed)
The lady at [facility] states that they do need an MD's order.

## 2012-10-03 NOTE — Telephone Encounter (Signed)
1.Pt states MD told her when she was in the last time that if she wanted to start a diet pill, MD would prescribe something?  2. Bone Density at [facility] Nov 19, 13- Please send over referral  3. Ambien refilled  Pt scheduled appt if you want to wait until then to answer these?

## 2012-10-03 NOTE — Telephone Encounter (Signed)
So I have to see her and check her vitals before prescribing the Phentermine. Please check with [facility] and see if they need a written order from me for bone density study

## 2012-10-04 ENCOUNTER — Encounter: Payer: Self-pay | Admitting: Family Medicine

## 2012-10-04 ENCOUNTER — Ambulatory Visit (INDEPENDENT_AMBULATORY_CARE_PROVIDER_SITE_OTHER): Payer: Medicare Other | Admitting: Family Medicine

## 2012-10-04 VITALS — BP 129/74 | HR 75 | Temp 97.2°F | Ht 66.25 in | Wt 205.8 lb

## 2012-10-04 DIAGNOSIS — M545 Low back pain, unspecified: Secondary | ICD-10-CM | POA: Insufficient documentation

## 2012-10-04 DIAGNOSIS — E663 Overweight: Secondary | ICD-10-CM

## 2012-10-04 DIAGNOSIS — M549 Dorsalgia, unspecified: Secondary | ICD-10-CM | POA: Diagnosis not present

## 2012-10-04 DIAGNOSIS — E669 Obesity, unspecified: Secondary | ICD-10-CM | POA: Diagnosis not present

## 2012-10-04 DIAGNOSIS — E785 Hyperlipidemia, unspecified: Secondary | ICD-10-CM

## 2012-10-04 DIAGNOSIS — G4733 Obstructive sleep apnea (adult) (pediatric): Secondary | ICD-10-CM | POA: Diagnosis not present

## 2012-10-04 DIAGNOSIS — M25561 Pain in right knee: Secondary | ICD-10-CM

## 2012-10-04 DIAGNOSIS — E119 Type 2 diabetes mellitus without complications: Secondary | ICD-10-CM

## 2012-10-04 DIAGNOSIS — G4700 Insomnia, unspecified: Secondary | ICD-10-CM

## 2012-10-04 DIAGNOSIS — M25569 Pain in unspecified knee: Secondary | ICD-10-CM

## 2012-10-04 HISTORY — DX: Low back pain, unspecified: M54.50

## 2012-10-04 LAB — CBC
MCHC: 32.4 g/dL (ref 30.0–36.0)
RDW: 14.3 % (ref 11.5–14.6)
WBC: 7.5 10*3/uL (ref 4.5–10.5)

## 2012-10-04 LAB — RENAL FUNCTION PANEL
CO2: 28 mEq/L (ref 19–32)
Chloride: 106 mEq/L (ref 96–112)
Creatinine, Ser: 0.7 mg/dL (ref 0.4–1.2)
GFR: 88.5 mL/min (ref >60.00)
Phosphorus: 2.7 mg/dL (ref 2.3–4.6)
Sodium: 140 mEq/L (ref 135–145)

## 2012-10-04 MED ORDER — CYCLOBENZAPRINE HCL 10 MG PO TABS: 10.0000 mg | ORAL_TABLET | Freq: Every evening | ORAL | Status: DC | PRN

## 2012-10-04 MED ORDER — PHENTERMINE HCL 15 MG PO CAPS: 15.0000 mg | ORAL_CAPSULE | ORAL | Status: DC

## 2012-10-04 MED ORDER — CYCLOBENZAPRINE HCL 10 MG PO TABS: 10.0000 mg | ORAL_TABLET | Freq: Three times a day (TID) | ORAL | Status: DC | PRN

## 2012-10-04 MED ORDER — IBUPROFEN 200 MG PO TABS: 400.0000 mg | ORAL_TABLET | Freq: Three times a day (TID) | ORAL | Status: DC | PRN

## 2012-10-04 NOTE — Assessment & Plan Note (Signed)
Tolerating Krill oil and Zocor

## 2012-10-04 NOTE — Assessment & Plan Note (Signed)
Patient doing well in this regard

## 2012-10-04 NOTE — Assessment & Plan Note (Signed)
Patient has lost some weight, is given a copy of the DASH diet and encouraged to start a mild exercise regimen as tolerated. Given an Rx for Phentermine 15 mg daily to help with appetite suppression. Reassess vitals in 3-4 weeks and warned about possible side effects, if any concerns develop stop medicine and come back in for evaluation

## 2012-10-04 NOTE — Assessment & Plan Note (Signed)
Very stiff and pained especially in am and after prolonged sitting. May try Cyclobenzaprine 5-10 mg qhs prn and see if that helps, consider a new exercise routine such as yoga

## 2012-10-04 NOTE — Patient Instructions (Addendum)
Obesity Obesity is defined as having too much total body fat and a body mass index (BMI) of 30 or more. BMI is an estimate of body fat and is calculated from your height and weight. Obesity happens when you consume more calories than you can burn by exercising or performing daily physical tasks. Prolonged obesity can cause major illnesses or emergencies, such as:   A stroke.  Heart disease.  Diabetes.  Cancer.  Arthritis.  High blood pressure (hypertension).  High cholesterol.  Sleep apnea.  Erectile dysfunction.  Infertility problems. CAUSES   Regularly eating unhealthy foods.  Physical inactivity.  Certain disorders, such as an underactive thyroid (hypothyroidism), Cushing's syndrome, and polycystic ovarian syndrome.  Certain medicines, such as steroids, some depression medicines, and antipsychotics.  Genetics.  Lack of sleep. DIAGNOSIS  A caregiver can diagnose obesity after calculating your BMI. Obesity will be diagnosed if your BMI is 30 or higher.  There are other methods of measuring obesity levels. Some other methods include measuring your skin fold thickness, your waist circumference, and comparing your hip circumference to your waist circumference. TREATMENT  A healthy treatment program includes some or all of the following:  Long-term dietary changes.  Exercise and physical activity.  Behavioral and lifestyle changes.  Medicine only under the supervision of your caregiver. Medicines may help, but only if they are used with diet and exercise programs. An unhealthy treatment program includes:  Fasting.  Fad diets.  Supplements and drugs. These choices do not succeed in long-term weight control.  HOME CARE INSTRUCTIONS   Exercise and perform physical activity as directed by your caregiver. To increase physical activity, try the following:  Use stairs instead of elevators.  Park farther away from store entrances.  Garden, bike, or walk instead of  watching television or using the computer.  Eat healthy, low-calorie foods and drinks on a regular basis. Eat more fruits and vegetables. Use low-calorie cookbooks or take healthy cooking classes.  Limit fast food, sweets, and processed snack foods.  Eat smaller portions.  Keep a daily journal of everything you eat. There are many free websites to help you with this. It may be helpful to measure your foods so you can determine if you are eating the correct portion sizes.  Avoid drinking alcohol. Drink more water and drinks without calories.  Take vitamins and supplements only as recommended by your caregiver.  Weight-loss support groups, Registered Dieticians, counselors, and stress reduction education can also be very helpful. SEEK IMMEDIATE MEDICAL CARE IF:  You have chest pain or tightness.  You have trouble breathing or feel short of breath.  You have weakness or leg numbness.  You feel confused or have trouble talking.  You have sudden changes in your vision. MAKE SURE YOU:  Understand these instructions.  Will watch your condition.  Will get help right away if you are not doing well or get worse. Document Released: 01/13/2005 Document Revised: 06/06/2012 Document Reviewed: 01/12/2012 [employer] Patient Information 2013 [employer].  

## 2012-10-04 NOTE — Progress Notes (Signed)
Patient ID: Amanda Carlson, female   DOB: 10-03-1946, 66 y.o.   MRN: [id number] Amanda Carlson [id number] Jul 09, 1946 10/04/2012      Progress Note-Follow Up  Subjective  Chief Complaint  Chief Complaint  Patient presents with  . Follow-up    med refill on Ambien/ Start diet pill    HPI  This is a 65 all Caucasian female who is in today for followup. She is concerned about her poor sleep. She has to help care for her elderly parents in their 93s and she's afraid about getting to not doubt but has trouble falling asleep and staying asleep. She's used Ambien in the past with good results but at times finds it overly sedating, no confusion or nightmares. She uses her CPAP machine. Her biggest concern today is chronic pain. Is a chronic stiff sore low back. Has a lot of knee pain and hip pain as well as stiffness and weakness in her hands. Says her pain and stiffness are worse after sleeping and prolonged sitting and listening she gets moving. Never sees any redness warmth or swelling around her joints. She has not had any recent acute illness. Denies any fevers chills, chest pain, palpitations or shortness of breath. No GI or GU complaints are offered today. She has been trying to watch her by mouth intake and has lost a small amount of weight since her last visit. Reports her blood sugars are well controlled.  Past Medical History  Diagnosis Date  . Knee pain, right 10/16/2011  . Knee pain, bilateral 10/16/2011  . HYPOTHYROIDISM 01/22/2011    Qualifier: Diagnosis of  By: Abner Greenspan MD, Claria Dice with Dr Talmage Nap   . Overweight 01/22/2011    Qualifier: Diagnosis of  By: Abner Greenspan MD, Misty Stanley    . Intrinsic asthma, unspecified 01/22/2011    Qualifier: Diagnosis of  By: Abner Greenspan MD, Misty Stanley    . THYROID CYST 01/22/2011    Qualifier: Diagnosis of  By: Abner Greenspan MD, Misty Stanley    . ALLERGIC RHINITIS, SEASONAL 01/22/2011    Qualifier: Diagnosis of  By: Abner Greenspan MD, Misty Stanley    . GERD 01/22/2011    Qualifier: Diagnosis of   By: Abner Greenspan MD, Misty Stanley    . Irritable bowel syndrome 01/22/2011    Qualifier: Diagnosis of  By: Abner Greenspan MD, Misty Stanley    . CTS (carpal tunnel syndrome) 06/07/2012  . Tinea corporis 06/07/2012  . Insomnia 06/07/2012  . Low back pain 10/04/2012    Past Surgical History  Procedure Date  . Knee arthroscopy     right  . Cholecystectomy   . Tubal ligation   . Cervical fusion, discectomy, metal plate and 2 screws in place   . Thyroidectomy, partial     Family History  Problem Relation Age of Onset  . Atrial fibrillation Mother   . Other Mother     Back pain, CHF  . Arthritis Mother   . Osteoporosis Mother   . Heart disease Father   . Hypertension Father   . Diabetes Father   . Sleep apnea Father   . Atrial fibrillation Father     paroxysmal  . Diabetes Brother   . Diabetes Maternal Aunt     X 2 aunts  . Cancer Maternal Grandmother   . Diabetes Maternal Grandfather   . Stroke Paternal Grandmother   . Heart disease Paternal Grandfather   . Stroke Paternal Grandfather     History   Social History  . Marital Status: Divorced    Spouse  Name: N/A    Number of Children: N/A  . Years of Education: N/A   Occupational History  . Not on file.   Social History Main Topics  . Smoking status: Never Smoker   . Smokeless tobacco: Never Used  . Alcohol Use: 0.0 oz/week    0 drink(s) per week     No intake of alcohol in greater than 10 yrs  . Drug Use: No  . Sexually Active: Not on file   Other Topics Concern  . Not on file   Social History Narrative  . No narrative on file    Current Outpatient Prescriptions on File Prior to Visit  Medication Sig Dispense Refill  . albuterol (PROVENTIL HFA) 108 (90 BASE) MCG/ACT inhaler Inhale 2 puffs into the lungs as needed. For wheezing  2 Inhaler  2  . ALPRAZolam (XANAX) 0.25 MG tablet Take 1 tablet (0.25 mg total) by mouth daily as needed for sleep or anxiety. For anxiety  30 tablet  2  . BIOTIN 5000 PO Take 1 tablet by mouth daily.      .  Calcium Carbonate-Vitamin D (CALTRATE 600+D PO) Take 3 tablets by mouth daily.      . cetirizine (ZYRTEC) 10 MG tablet Take 10 mg by mouth daily.        . Cholecalciferol (VITAMIN D) 1000 UNITS capsule Take 1,000 Units by mouth daily. 6 days a week      . Fluticasone-Salmeterol (ADVAIR DISKUS) 250-50 MCG/DOSE AEPB Inhale 1 puff into the lungs once.  60 each  2  . furosemide (LASIX) 20 MG tablet Take 1 tablet (20 mg total) by mouth as needed.  30 tablet  5  . glucosamine-chondroitin 500-400 MG tablet Take 2 tablets by mouth daily.      Marland Kitchen loperamide (IMODIUM) 2 MG capsule Take 2 mg by mouth as needed.      . metformin (FORTAMET) 500 MG (OSM) 24 hr tablet Take 500 mg by mouth 2 (two) times daily.        . simvastatin (ZOCOR) 40 MG tablet Take 1 tablet (40 mg total) by mouth at bedtime.  30 tablet  5  . traMADol (ULTRAM) 50 MG tablet Take 1 tablet (50 mg total) by mouth 2 (two) times daily as needed for pain. 1 tab po bid prn severe pain  60 tablet  2  . vitamin E 400 UNIT capsule Take 1,200 Units by mouth daily.      Marland Kitchen amoxicillin (AMOXIL) 500 MG tablet Take 500 mg by mouth as needed. Prior to dental work       . phentermine 15 MG capsule Take 1 capsule (15 mg total) by mouth every morning.  30 capsule  0    Allergies  Allergen Reactions  . Codeine     REACTION: causes itching  . Tetanus Toxoid     REACTION: NO Tetanus shot    Review of Systems  Review of Systems  Constitutional: Negative for fever and malaise/fatigue.  HENT: Negative for congestion.   Eyes: Negative for discharge.  Respiratory: Negative for shortness of breath.   Cardiovascular: Negative for chest pain, palpitations and leg swelling.  Gastrointestinal: Negative for nausea, abdominal pain and diarrhea.  Genitourinary: Negative for dysuria.  Musculoskeletal: Positive for myalgias, back pain and joint pain. Negative for falls.  Skin: Negative for rash.  Neurological: Negative for loss of consciousness and headaches.    Endo/Heme/Allergies: Negative for polydipsia.  Psychiatric/Behavioral: Negative for depression and suicidal ideas. The patient has  insomnia. The patient is not nervous/anxious.     Objective  BP 129/74  Pulse 75  Temp 97.2 F (36.2 C) (Temporal)  Ht 5' 6.25" (1.683 m)  Wt 205 lb 12.8 oz (93.35 kg)  BMI 32.97 kg/m2  SpO2 96%  Physical Exam  Physical Exam  Constitutional: She is oriented to person, place, and time and well-developed, well-nourished, and in no distress. No distress.  HENT:  Head: Normocephalic and atraumatic.  Eyes: Conjunctivae normal are normal.  Neck: Neck supple. No thyromegaly present.  Cardiovascular: Normal rate, regular rhythm and normal heart sounds.   No murmur heard. Pulmonary/Chest: Effort normal and breath sounds normal. She has no wheezes.  Abdominal: She exhibits no distension and no mass.  Musculoskeletal: She exhibits no edema.  Lymphadenopathy:    She has no cervical adenopathy.  Neurological: She is alert and oriented to person, place, and time.  Skin: Skin is warm and dry. No rash noted. She is not diaphoretic.  Psychiatric: Memory, affect and judgment normal.     Lab Results  Component Value Date   CREATININE 0.84 12/15/2008   BUN 14 12/15/2008   NA 141 12/15/2008   K 3.7 12/15/2008   CL 106 12/15/2008   CO2 26 12/15/2008     Assessment & Plan  SLEEP APNEA, OBSTRUCTIVE Uses her CPAp rountinely  OVERWEIGHT Patient has lost some weight, is given a copy of the DASH diet and encouraged to start a mild exercise regimen as tolerated. Given an Rx for Phentermine 15 mg daily to help with appetite suppression. Reassess vitals in 3-4 weeks and warned about possible side effects, if any concerns develop stop medicine and come back in for evaluation  Knee pain, bilateral Will continue NSAIDs bid prn, may use tylenol for breakthough and/or Tramadol sparingly  Insomnia Has used Ambien in the past but it causes some excessive sedation  at times, will hold for now and try other meditions and sleep hygiene  DM Patient doing well in this regard  Low back pain Very stiff and pained especially in am and after prolonged sitting. May try Cyclobenzaprine 5-10 mg qhs prn and see if that helps, consider a new exercise routine such as yoga  HYPERLIPIDEMIA Tolerating Krill oil and Zocor

## 2012-10-04 NOTE — Assessment & Plan Note (Signed)
Has used Ambien in the past but it causes some excessive sedation at times, will hold for now and try other meditions and sleep hygiene

## 2012-10-04 NOTE — Assessment & Plan Note (Addendum)
Will continue NSAIDs bid prn, may use tylenol for breakthough and/or Tramadol sparingly

## 2012-10-04 NOTE — Assessment & Plan Note (Signed)
Uses her CPAp rountinely

## 2012-10-05 NOTE — Progress Notes (Signed)
Quick Note:  Patient Informed and voiced understanding ______ 

## 2012-10-19 ENCOUNTER — Other Ambulatory Visit: Payer: Self-pay

## 2012-10-19 MED ORDER — FLUTICASONE-SALMETEROL 250-50 MCG/DOSE IN AEPB: 1.0000 | INHALATION_SPRAY | Freq: Once | RESPIRATORY_TRACT | Status: DC

## 2012-10-19 MED ORDER — ALPRAZOLAM 0.25 MG PO TABS: 0.2500 mg | ORAL_TABLET | Freq: Every day | ORAL | Status: DC | PRN

## 2012-10-19 NOTE — Telephone Encounter (Signed)
Please advise Alprazolam refill? Last RX wrote on 06-05-12 quantity 30 with 2 refills/  If ok send to [phone]

## 2012-10-24 ENCOUNTER — Telehealth: Payer: Self-pay | Admitting: Family Medicine

## 2012-10-24 NOTE — Telephone Encounter (Signed)
Ok to check with [facility] to see how they need [place] to order mask

## 2012-10-24 NOTE — Telephone Encounter (Signed)
Patient is requesting an order for a new mask for her CPAP machine to be put in the system for [facility].

## 2012-10-24 NOTE — Telephone Encounter (Signed)
Please advise 

## 2012-10-25 ENCOUNTER — Other Ambulatory Visit: Payer: Self-pay | Admitting: Family Medicine

## 2012-10-25 DIAGNOSIS — G4730 Sleep apnea, unspecified: Secondary | ICD-10-CM

## 2012-10-25 NOTE — Telephone Encounter (Signed)
I spoke with Amanda Carlson and she stated that it needs to just say mask refit.

## 2012-10-25 NOTE — Telephone Encounter (Signed)
done

## 2012-10-27 ENCOUNTER — Ambulatory Visit: Payer: Medicare Other | Admitting: Family Medicine

## 2012-11-07 DIAGNOSIS — M949 Disorder of cartilage, unspecified: Secondary | ICD-10-CM | POA: Diagnosis not present

## 2012-11-07 DIAGNOSIS — M899 Disorder of bone, unspecified: Secondary | ICD-10-CM | POA: Diagnosis not present

## 2012-11-07 DIAGNOSIS — Z1231 Encounter for screening mammogram for malignant neoplasm of breast: Secondary | ICD-10-CM | POA: Diagnosis not present

## 2012-11-07 DIAGNOSIS — Z8262 Family history of osteoporosis: Secondary | ICD-10-CM | POA: Diagnosis not present

## 2012-11-13 ENCOUNTER — Encounter: Payer: Self-pay | Admitting: Family Medicine

## 2012-11-15 ENCOUNTER — Ambulatory Visit: Payer: Medicare Other | Admitting: Family Medicine

## 2012-11-20 ENCOUNTER — Telehealth: Payer: Self-pay

## 2012-11-20 NOTE — Telephone Encounter (Signed)
Per MD inform pt of Bone Density results: bone thinning in hips, ok in spine = Osteopenia. Continue Cal/Vit D BID, increase exercise, continue Vitamin D

## 2012-11-21 DIAGNOSIS — R928 Other abnormal and inconclusive findings on diagnostic imaging of breast: Secondary | ICD-10-CM | POA: Diagnosis not present

## 2012-11-21 DIAGNOSIS — Z09 Encounter for follow-up examination after completed treatment for conditions other than malignant neoplasm: Secondary | ICD-10-CM | POA: Diagnosis not present

## 2012-11-21 LAB — HM MAMMOGRAPHY

## 2012-11-22 ENCOUNTER — Encounter: Payer: Self-pay | Admitting: Family Medicine

## 2012-11-23 ENCOUNTER — Ambulatory Visit (INDEPENDENT_AMBULATORY_CARE_PROVIDER_SITE_OTHER): Payer: Medicare Other | Admitting: Family Medicine

## 2012-11-23 ENCOUNTER — Encounter: Payer: Self-pay | Admitting: Family Medicine

## 2012-11-23 VITALS — BP 128/81 | HR 76 | Temp 98.4°F | Ht 66.25 in | Wt 200.0 lb

## 2012-11-23 DIAGNOSIS — M545 Low back pain, unspecified: Secondary | ICD-10-CM | POA: Diagnosis not present

## 2012-11-23 DIAGNOSIS — E669 Obesity, unspecified: Secondary | ICD-10-CM

## 2012-11-23 DIAGNOSIS — E119 Type 2 diabetes mellitus without complications: Secondary | ICD-10-CM

## 2012-11-23 DIAGNOSIS — E785 Hyperlipidemia, unspecified: Secondary | ICD-10-CM

## 2012-11-23 DIAGNOSIS — M25561 Pain in right knee: Secondary | ICD-10-CM

## 2012-11-23 DIAGNOSIS — M25569 Pain in unspecified knee: Secondary | ICD-10-CM | POA: Diagnosis not present

## 2012-11-23 DIAGNOSIS — G4700 Insomnia, unspecified: Secondary | ICD-10-CM | POA: Diagnosis not present

## 2012-11-23 DIAGNOSIS — G4733 Obstructive sleep apnea (adult) (pediatric): Secondary | ICD-10-CM

## 2012-11-23 DIAGNOSIS — J301 Allergic rhinitis due to pollen: Secondary | ICD-10-CM

## 2012-11-23 MED ORDER — ZOLPIDEM TARTRATE 10 MG PO TABS: 10.0000 mg | ORAL_TABLET | Freq: Every day | ORAL | Status: DC

## 2012-11-23 MED ORDER — PHENTERMINE HCL 37.5 MG PO CAPS: 37.5000 mg | ORAL_CAPSULE | ORAL | Status: DC

## 2012-11-23 MED ORDER — NAPROXEN 375 MG PO TABS: 375.0000 mg | ORAL_TABLET | Freq: Two times a day (BID) | ORAL | Status: DC

## 2012-11-23 MED ORDER — TRAMADOL HCL 50 MG PO TABS: 50.0000 mg | ORAL_TABLET | Freq: Four times a day (QID) | ORAL | Status: DC | PRN

## 2012-11-23 MED ORDER — CARISOPRODOL 350 MG PO TABS: 350.0000 mg | ORAL_TABLET | Freq: Two times a day (BID) | ORAL | Status: DC | PRN

## 2012-11-23 NOTE — Progress Notes (Signed)
Patient ID: Amanda Carlson, female   DOB: [birth year], 66 y.o.   MRN: [id number] Amanda Carlson [id number] 06-15-1946 11/23/2012      Progress Note-Follow Up  Subjective  Chief Complaint  Chief Complaint  Patient presents with  . Follow-up    3 month    HPI  Patient is a 66 year old Caucasian female who is in today for six-month followup. Overall she's feeling well. She's not had any recent illness, fevers, chills, chest pain, palpitations, shortness of breath, GI or GU complaints. Her bowels are somewhat improved. She uses her CPAP machine routinely but does still have trouble sleeping at times. Her largest complaint is pain. His chronic low back pain and stiffness. She also has chronic bilateral hip pain no tramadol helped slightly but not significantly. Flexeril was of no help so she stopped it. No falls with radiculopathy to the feet or other acute complaints are noted. No incontinence is noted.  Past Medical History  Diagnosis Date  . Knee pain, right 10/16/2011  . Knee pain, bilateral 10/16/2011  . HYPOTHYROIDISM 01/22/2011    Qualifier: Diagnosis of  By: Abner Greenspan MD, Claria Dice with Dr Talmage Nap   . Overweight 01/22/2011    Qualifier: Diagnosis of  By: Abner Greenspan MD, Misty Stanley    . Intrinsic asthma, unspecified 01/22/2011    Qualifier: Diagnosis of  By: Abner Greenspan MD, Misty Stanley    . THYROID CYST 01/22/2011    Qualifier: Diagnosis of  By: Abner Greenspan MD, Misty Stanley    . ALLERGIC RHINITIS, SEASONAL 01/22/2011    Qualifier: Diagnosis of  By: Abner Greenspan MD, Misty Stanley    . GERD 01/22/2011    Qualifier: Diagnosis of  By: Abner Greenspan MD, Misty Stanley    . Irritable bowel syndrome 01/22/2011    Qualifier: Diagnosis of  By: Abner Greenspan MD, Misty Stanley    . CTS (carpal tunnel syndrome) 06/07/2012  . Tinea corporis 06/07/2012  . Insomnia 06/07/2012  . Low back pain 10/04/2012    Past Surgical History  Procedure Date  . Knee arthroscopy     right  . Cholecystectomy   . Tubal ligation   . Cervical fusion, discectomy, metal plate and 2 screws in  place   . Thyroidectomy, partial     Family History  Problem Relation Age of Onset  . Atrial fibrillation Mother   . Other Mother     Back pain, CHF  . Arthritis Mother   . Osteoporosis Mother   . Heart disease Father   . Hypertension Father   . Diabetes Father   . Sleep apnea Father   . Atrial fibrillation Father     paroxysmal  . Diabetes Brother   . Diabetes Maternal Aunt     X 2 aunts  . Cancer Maternal Grandmother   . Diabetes Maternal Grandfather   . Stroke Paternal Grandmother   . Heart disease Paternal Grandfather   . Stroke Paternal Grandfather     History   Social History  . Marital Status: Divorced    Spouse Name: N/A    Number of Children: N/A  . Years of Education: N/A   Occupational History  . Not on file.   Social History Main Topics  . Smoking status: Never Smoker   . Smokeless tobacco: Never Used  . Alcohol Use: 0.0 oz/week    0 drink(s) per week     Comment: No intake of alcohol in greater than 10 yrs  . Drug Use: No  . Sexually Active: Not on file   Other Topics Concern  .  Not on file   Social History Narrative  . No narrative on file    Current Outpatient Prescriptions on File Prior to Visit  Medication Sig Dispense Refill  . albuterol (PROVENTIL HFA) 108 (90 BASE) MCG/ACT inhaler Inhale 2 puffs into the lungs as needed. For wheezing  2 Inhaler  2  . ALPRAZolam (XANAX) 0.25 MG tablet Take 1 tablet (0.25 mg total) by mouth daily as needed for sleep or anxiety. For anxiety  30 tablet  2  . amoxicillin (AMOXIL) 500 MG tablet Take 500 mg by mouth as needed. Prior to dental work       . aspirin 81 MG tablet Take 81 mg by mouth daily.      Marland Kitchen BIOTIN 5000 PO Take 1 tablet by mouth daily.      . Calcium Carbonate-Vitamin D (CALTRATE 600+D PO) Take 3 tablets by mouth daily.      . cetirizine (ZYRTEC) 10 MG tablet Take 10 mg by mouth daily.        . Cholecalciferol (VITAMIN D) 1000 UNITS capsule Take 1,000 Units by mouth daily. 6 days a week       . cyclobenzaprine (FLEXERIL) 10 MG tablet Take 1 tablet (10 mg total) by mouth at bedtime as needed for muscle spasms.  30 tablet  0  . Fluticasone-Salmeterol (ADVAIR DISKUS) 250-50 MCG/DOSE AEPB Inhale 1 puff into the lungs once.  60 each  2  . furosemide (LASIX) 20 MG tablet Take 1 tablet (20 mg total) by mouth as needed.  30 tablet  5  . glucosamine-chondroitin 500-400 MG tablet Take 2 tablets by mouth daily.      Marland Kitchen levothyroxine (SYNTHROID, LEVOTHROID) 112 MCG tablet Take 112 mcg by mouth daily.      Marland Kitchen loperamide (IMODIUM) 2 MG capsule Take 2 mg by mouth as needed.      . metformin (FORTAMET) 500 MG (OSM) 24 hr tablet Take 500 mg by mouth 2 (two) times daily.        Marland Kitchen OVER THE COUNTER MEDICATION 300 mg daily. Mega Red      . simvastatin (ZOCOR) 40 MG tablet Take 1 tablet (40 mg total) by mouth at bedtime.  30 tablet  5  . vitamin E 400 UNIT capsule Take 1,200 Units by mouth daily.      Marland Kitchen zolpidem (AMBIEN) 10 MG tablet Take 1 tablet (10 mg total) by mouth daily.  15 tablet  5    Allergies  Allergen Reactions  . Codeine     REACTION: causes itching  . Tetanus Toxoid     REACTION: NO Tetanus shot    Review of Systems  Review of Systems  Constitutional: Negative for fever and malaise/fatigue.  HENT: Negative for congestion.   Eyes: Negative for discharge.  Respiratory: Negative for shortness of breath.   Cardiovascular: Negative for chest pain, palpitations and leg swelling.  Gastrointestinal: Negative for nausea, abdominal pain and diarrhea.  Genitourinary: Negative for dysuria.  Musculoskeletal: Positive for back pain and joint pain. Negative for falls.  Skin: Negative for rash.  Neurological: Negative for loss of consciousness and headaches.  Endo/Heme/Allergies: Negative for polydipsia.  Psychiatric/Behavioral: Negative for depression and suicidal ideas. The patient is not nervous/anxious and does not have insomnia.     Objective  BP 128/81  Pulse 76  Temp 98.4 F  (36.9 C) (Temporal)  Ht 5' 6.25" (1.683 m)  Wt 200 lb (90.719 kg)  BMI 32.04 kg/m2  SpO2 98%  Physical Exam  Physical  Exam  Constitutional: She is oriented to person, place, and time and well-developed, well-nourished, and in no distress. No distress.  HENT:  Head: Normocephalic and atraumatic.  Eyes: Conjunctivae normal are normal.  Neck: Neck supple. No thyromegaly present.  Cardiovascular: Normal rate, regular rhythm and normal heart sounds.   Pulmonary/Chest: Effort normal and breath sounds normal. She has no wheezes.  Abdominal: She exhibits no distension and no mass.  Musculoskeletal: She exhibits no edema.  Lymphadenopathy:    She has no cervical adenopathy.  Neurological: She is alert and oriented to person, place, and time.  Skin: Skin is warm and dry. No rash noted. She is not diaphoretic.  Psychiatric: Memory, affect and judgment normal.    No results found for this basename: TSH   Lab Results  Component Value Date   WBC 7.5 10/04/2012   HGB 13.1 10/04/2012   HCT 40.5 10/04/2012   MCV 93.1 10/04/2012   PLT 222.0 10/04/2012   Lab Results  Component Value Date   CREATININE 0.7 10/04/2012   BUN 13 10/04/2012   NA 140 10/04/2012   K 4.0 10/04/2012   CL 106 10/04/2012   CO2 28 10/04/2012     Assessment & Plan  ALLERGIC RHINITIS, SEASONAL No acute c/o  DM Stable disease no recent change in meds  HYPERLIPIDEMIA Will continue to monitor lipids, avoid trans fats, no changes  Low back pain Stiffness c/w OA, encouraged increased activity and increase Tramadol to qid as needed switch to Soma prn   SLEEP APNEA, OBSTRUCTIVE Uses machine routinely given a refill on Zolpidem to use prn

## 2012-11-23 NOTE — Patient Instructions (Addendum)
Obesity Obesity is defined as having too much total body fat and a body mass index (BMI) of 30 or more. BMI is an estimate of body fat and is calculated from your height and weight. Obesity happens when you consume more calories than you can burn by exercising or performing daily physical tasks. Prolonged obesity can cause major illnesses or emergencies, such as:   A stroke.  Heart disease.  Diabetes.  Cancer.  Arthritis.  High blood pressure (hypertension).  High cholesterol.  Sleep apnea.  Erectile dysfunction.  Infertility problems. CAUSES   Regularly eating unhealthy foods.  Physical inactivity.  Certain disorders, such as an underactive thyroid (hypothyroidism), Cushing's syndrome, and polycystic ovarian syndrome.  Certain medicines, such as steroids, some depression medicines, and antipsychotics.  Genetics.  Lack of sleep. DIAGNOSIS  A caregiver can diagnose obesity after calculating your BMI. Obesity will be diagnosed if your BMI is 30 or higher.  There are other methods of measuring obesity levels. Some other methods include measuring your skin fold thickness, your waist circumference, and comparing your hip circumference to your waist circumference. TREATMENT  A healthy treatment program includes some or all of the following:  Long-term dietary changes.  Exercise and physical activity.  Behavioral and lifestyle changes.  Medicine only under the supervision of your caregiver. Medicines may help, but only if they are used with diet and exercise programs. An unhealthy treatment program includes:  Fasting.  Fad diets.  Supplements and drugs. These choices do not succeed in long-term weight control.  HOME CARE INSTRUCTIONS   Exercise and perform physical activity as directed by your caregiver. To increase physical activity, try the following:  Use stairs instead of elevators.  Park farther away from store entrances.  Garden, bike, or walk instead of  watching television or using the computer.  Eat healthy, low-calorie foods and drinks on a regular basis. Eat more fruits and vegetables. Use low-calorie cookbooks or take healthy cooking classes.  Limit fast food, sweets, and processed snack foods.  Eat smaller portions.  Keep a daily journal of everything you eat. There are many free websites to help you with this. It may be helpful to measure your foods so you can determine if you are eating the correct portion sizes.  Avoid drinking alcohol. Drink more water and drinks without calories.  Take vitamins and supplements only as recommended by your caregiver.  Weight-loss support groups, Registered Dieticians, counselors, and stress reduction education can also be very helpful. SEEK IMMEDIATE MEDICAL CARE IF:  You have chest pain or tightness.  You have trouble breathing or feel short of breath.  You have weakness or leg numbness.  You feel confused or have trouble talking.  You have sudden changes in your vision. MAKE SURE YOU:  Understand these instructions.  Will watch your condition.  Will get help right away if you are not doing well or get worse. Document Released: 01/13/2005 Document Revised: 06/06/2012 Document Reviewed: 01/12/2012 [employer] Patient Information 2013 [employer].  

## 2012-11-23 NOTE — Assessment & Plan Note (Signed)
No acute c/o

## 2012-11-23 NOTE — Assessment & Plan Note (Signed)
Will continue to monitor lipids, avoid trans fats, no changes

## 2012-11-23 NOTE — Assessment & Plan Note (Signed)
Stiffness c/w OA, encouraged increased activity and increase Tramadol to qid as needed switch to Soma prn

## 2012-11-23 NOTE — Assessment & Plan Note (Signed)
Stable disease no recent change in meds

## 2012-11-23 NOTE — Assessment & Plan Note (Signed)
Uses machine routinely given a refill on Zolpidem to use prn

## 2012-11-27 ENCOUNTER — Encounter: Payer: Self-pay | Admitting: Family Medicine

## 2012-12-21 ENCOUNTER — Ambulatory Visit (INDEPENDENT_AMBULATORY_CARE_PROVIDER_SITE_OTHER): Payer: Medicare Other | Admitting: Family Medicine

## 2012-12-21 ENCOUNTER — Encounter: Payer: Self-pay | Admitting: Family Medicine

## 2012-12-21 VITALS — BP 112/64 | HR 76 | Temp 98.0°F | Ht 66.25 in | Wt 199.1 lb

## 2012-12-21 DIAGNOSIS — M549 Dorsalgia, unspecified: Secondary | ICD-10-CM

## 2012-12-21 DIAGNOSIS — E669 Obesity, unspecified: Secondary | ICD-10-CM

## 2012-12-21 DIAGNOSIS — E119 Type 2 diabetes mellitus without complications: Secondary | ICD-10-CM

## 2012-12-21 DIAGNOSIS — E039 Hypothyroidism, unspecified: Secondary | ICD-10-CM

## 2012-12-21 DIAGNOSIS — M25569 Pain in unspecified knee: Secondary | ICD-10-CM

## 2012-12-21 DIAGNOSIS — M25561 Pain in right knee: Secondary | ICD-10-CM

## 2012-12-21 DIAGNOSIS — G4700 Insomnia, unspecified: Secondary | ICD-10-CM

## 2012-12-21 DIAGNOSIS — M129 Arthropathy, unspecified: Secondary | ICD-10-CM

## 2012-12-21 DIAGNOSIS — M545 Low back pain, unspecified: Secondary | ICD-10-CM

## 2012-12-21 DIAGNOSIS — E663 Overweight: Secondary | ICD-10-CM

## 2012-12-21 MED ORDER — TRAMADOL HCL 50 MG PO TABS: 100.0000 mg | ORAL_TABLET | Freq: Two times a day (BID) | ORAL | Status: DC | PRN

## 2012-12-21 MED ORDER — PHENTERMINE HCL 37.5 MG PO CAPS: 37.5000 mg | ORAL_CAPSULE | ORAL | Status: DC

## 2012-12-21 MED ORDER — METHOCARBAMOL 500 MG PO TABS: 500.0000 mg | ORAL_TABLET | Freq: Two times a day (BID) | ORAL | Status: DC | PRN

## 2012-12-21 MED ORDER — ZOLPIDEM TARTRATE 10 MG PO TABS: 10.0000 mg | ORAL_TABLET | Freq: Every evening | ORAL | Status: DC | PRN

## 2012-12-21 NOTE — Patient Instructions (Addendum)
Sacroiliac Joint Dysfunction The sacroiliac joint connects the lower part of the spine (the sacrum) with the bones of the pelvis. CAUSES  Sometimes, there is no obvious reason for sacroiliac joint dysfunction. Other times, it may occur   During pregnancy.  After injury, such as:  Car accidents.  Sport-related injuries.  Work-related injuries.  Due to one leg being shorter than the other.  Due to other conditions that affect the joints, such as:  Rheumatoid arthritis.  Gout.  Psoriasis.  Joint infection (septic arthritis). SYMPTOMS  Symptoms may include:  Pain in the:  Lower back.  Buttocks.  Groin.  Thighs and legs.  Difficult sitting, standing, walking, lying, bending or lifting. DIAGNOSIS  A number of tests may be used to help diagnose the cause of sacroiliac joint dysfunction, including:  Imaging tests to look for other causes of pain, including:  MRI.  CT scan.  Bone scan.  Diagnostic injection: During a special x-ray (called fluoroscopy), a needle is put into the sacroiliac joint. A numbing medicine is injected into the joint. If the pain is improved or stopped, the diagnosis of sacroiliac joint dysfunction is more likely. TREATMENT  There are a number of types of treatment used for sacroiliac joint dysfunction, including:  Only take over-the-counter or prescription medicines for pain, discomfort, or fever as directed by your caregiver.  Medications to relax muscles.  Rest. Decreasing activity can help cut down on painful muscle spasms and allow the back to heal.  Application of heat or ice to the lower back may improve muscle spasms and soothe pain.  Brace. A special back brace, called a sacroiliac belt, can help support the joint while your back is healing.  Physical therapy can help teach comfortable positions and exercises to strengthen muscles that support the sacroiliac joint.  Cortisone injections. Injections of steroid medicine into the  joint can help decrease swelling and improve pain.  Hyaluronic acid injections. This chemical improves lubrication within the sacroiliac joint, thereby decreasing pain.  Radiofrequency ablation. A special needle is placed into the joint, where it burns away nerves that are carrying pain messages from the joint.  Surgery. Because pain occurs during movement of the joint, screws and plates may be installed in order to limit or prevent joint motion. HOME CARE INSTRUCTIONS   Take all medications exactly as directed.  Follow instructions regarding both rest and physical activity, to avoid worsening the pain.  Do physical therapy exercises exactly as prescribed. SEEK IMMEDIATE MEDICAL CARE IF:  You experience increasingly severe pain.  You develop new symptoms, such as numbness or tingling in your legs or feet.  You lose bladder or bowel control. Document Released: 03/04/2009 Document Revised: 02/28/2012 Document Reviewed: 03/04/2009 [employer] Patient Information 2013 [employer].  

## 2012-12-21 NOTE — Assessment & Plan Note (Signed)
Tolerating Phentermine without any concerning side effects, given refills today and encouraged DASH diet and increased exercise.

## 2012-12-21 NOTE — Progress Notes (Signed)
Patient ID: Amanda Carlson, female   DOB: 04/23/1946, 67 y.o.   MRN: [id number] Amanda Carlson [id number] 22-Jun-1946 12/21/2012      Progress Note-Follow Up  Subjective  Chief Complaint  Chief Complaint  Patient presents with  . Follow-up    4 week follow up    HPI  Patient is a 68 year old Caucasian female who is in today for followup. Overall she's doing well. She is tolerating phentermine. Does feel it is suppressed her appetite. She's not had any headaches, insomnia, increased anxiety, chest pain, palpitations, shortness of breath. She is struggling with her endocrinologist next week and denies any recent trouble with her blood sugars. Patient denies chest pain palpitations GI or GU concerns today.  Past Medical History  Diagnosis Date  . Knee pain, right 10/16/2011  . Knee pain, bilateral 10/16/2011  . HYPOTHYROIDISM 01/22/2011    Qualifier: Diagnosis of  By: Abner Greenspan MD, Claria Dice with Dr Talmage Nap   . Overweight 01/22/2011    Qualifier: Diagnosis of  By: Abner Greenspan MD, Misty Stanley    . Intrinsic asthma, unspecified 01/22/2011    Qualifier: Diagnosis of  By: Abner Greenspan MD, Misty Stanley    . THYROID CYST 01/22/2011    Qualifier: Diagnosis of  By: Abner Greenspan MD, Misty Stanley    . ALLERGIC RHINITIS, SEASONAL 01/22/2011    Qualifier: Diagnosis of  By: Abner Greenspan MD, Misty Stanley    . GERD 01/22/2011    Qualifier: Diagnosis of  By: Abner Greenspan MD, Misty Stanley    . Irritable bowel syndrome 01/22/2011    Qualifier: Diagnosis of  By: Abner Greenspan MD, Misty Stanley    . CTS (carpal tunnel syndrome) 06/07/2012  . Tinea corporis 06/07/2012  . Insomnia 06/07/2012  . Low back pain 10/04/2012    Past Surgical History  Procedure Date  . Knee arthroscopy     right  . Cholecystectomy   . Tubal ligation   . Cervical fusion, discectomy, metal plate and 2 screws in place   . Thyroidectomy, partial     Family History  Problem Relation Age of Onset  . Atrial fibrillation Mother   . Other Mother     Back pain, CHF  . Arthritis Mother   . Osteoporosis Mother    . Heart disease Father   . Hypertension Father   . Diabetes Father   . Sleep apnea Father   . Atrial fibrillation Father     paroxysmal  . Diabetes Brother   . Diabetes Maternal Aunt     X 2 aunts  . Cancer Maternal Grandmother   . Diabetes Maternal Grandfather   . Stroke Paternal Grandmother   . Heart disease Paternal Grandfather   . Stroke Paternal Grandfather     History   Social History  . Marital Status: Divorced    Spouse Name: N/A    Number of Children: N/A  . Years of Education: N/A   Occupational History  . Not on file.   Social History Main Topics  . Smoking status: Never Smoker   . Smokeless tobacco: Never Used  . Alcohol Use: 0.0 oz/week    0 drink(s) per week     Comment: No intake of alcohol in greater than 10 yrs  . Drug Use: No  . Sexually Active: Not on file   Other Topics Concern  . Not on file   Social History Narrative  . No narrative on file    Current Outpatient Prescriptions on File Prior to Visit  Medication Sig Dispense Refill  . albuterol (PROVENTIL  HFA) 108 (90 BASE) MCG/ACT inhaler Inhale 2 puffs into the lungs as needed. For wheezing  2 Inhaler  2  . ALPRAZolam (XANAX) 0.25 MG tablet Take 1 tablet (0.25 mg total) by mouth daily as needed for sleep or anxiety. For anxiety  30 tablet  2  . BIOTIN 5000 PO Take 1 tablet by mouth daily.      . cetirizine (ZYRTEC) 10 MG tablet Take 10 mg by mouth daily.        . Cholecalciferol (VITAMIN D) 1000 UNITS capsule Take 1,000 Units by mouth daily. 6 days a week      . Fluticasone-Salmeterol (ADVAIR DISKUS) 250-50 MCG/DOSE AEPB Inhale 1 puff into the lungs once.  60 each  2  . furosemide (LASIX) 20 MG tablet Take 1 tablet (20 mg total) by mouth as needed.  30 tablet  5  . levothyroxine (SYNTHROID, LEVOTHROID) 112 MCG tablet Take 112 mcg by mouth daily.      Marland Kitchen loperamide (IMODIUM) 2 MG capsule Take 2 mg by mouth as needed.      . metformin (FORTAMET) 500 MG (OSM) 24 hr tablet Take 500 mg by mouth  2 (two) times daily.        . naproxen (NAPROSYN) 375 MG tablet Take 1 tablet (375 mg total) by mouth 2 (two) times daily with a meal. With food. Cannot take on same day as Ibuprofen  60 tablet  2  . OVER THE COUNTER MEDICATION 300 mg daily. Mega Red      . simvastatin (ZOCOR) 40 MG tablet Take 1 tablet (40 mg total) by mouth at bedtime.  30 tablet  5  . vitamin E 400 UNIT capsule Take 1,200 Units by mouth daily.      Marland Kitchen zolpidem (AMBIEN) 10 MG tablet Take 1 tablet (10 mg total) by mouth at bedtime as needed for sleep.  30 tablet  5  . amoxicillin (AMOXIL) 500 MG tablet Take 500 mg by mouth as needed. Prior to dental work       . aspirin 81 MG tablet Take 81 mg by mouth daily.      . Calcium Carbonate-Vitamin D (CALTRATE 600+D PO) Take 3 tablets by mouth daily.      Marland Kitchen glucosamine-chondroitin 500-400 MG tablet Take 2 tablets by mouth daily.        Allergies  Allergen Reactions  . Codeine     REACTION: causes itching  . Tetanus Toxoid     REACTION: NO Tetanus shot    Review of Systems  Review of Systems  Constitutional: Negative for fever and malaise/fatigue.  HENT: Negative for congestion.   Eyes: Negative for discharge.  Respiratory: Negative for shortness of breath.   Cardiovascular: Negative for chest pain, palpitations and leg swelling.  Gastrointestinal: Negative for nausea, abdominal pain and diarrhea.  Genitourinary: Negative for dysuria.  Musculoskeletal: Positive for back pain and joint pain. Negative for falls.  Skin: Negative for rash.  Neurological: Negative for loss of consciousness and headaches.  Endo/Heme/Allergies: Negative for polydipsia.  Psychiatric/Behavioral: Negative for depression and suicidal ideas. The patient is not nervous/anxious and does not have insomnia.     Objective  BP 112/64  Pulse 76  Temp 98 F (36.7 C) (Temporal)  Ht 5' 6.25" (1.683 m)  Wt 199 lb 1.9 oz (90.32 kg)  BMI 31.90 kg/m2  SpO2 98%  Physical Exam  Physical Exam    Constitutional: She is oriented to person, place, and time and well-developed, well-nourished, and in no  distress. No distress.  HENT:  Head: Normocephalic and atraumatic.  Eyes: Conjunctivae normal are normal.  Neck: Neck supple. No thyromegaly present.  Cardiovascular: Normal rate, regular rhythm and normal heart sounds.   No murmur heard. Pulmonary/Chest: Effort normal and breath sounds normal. She has no wheezes.  Abdominal: She exhibits no distension and no mass.  Musculoskeletal: She exhibits no edema.  Lymphadenopathy:    She has no cervical adenopathy.  Neurological: She is alert and oriented to person, place, and time.  Skin: Skin is warm and dry. No rash noted. She is not diaphoretic.  Psychiatric: Memory, affect and judgment normal.    No results found for this basename: TSH   Lab Results  Component Value Date   WBC 7.5 10/04/2012   HGB 13.1 10/04/2012   HCT 40.5 10/04/2012   MCV 93.1 10/04/2012   PLT 222.0 10/04/2012   Lab Results  Component Value Date   CREATININE 0.7 10/04/2012   BUN 13 10/04/2012   NA 140 10/04/2012   K 4.0 10/04/2012   CL 106 10/04/2012   CO2 28 10/04/2012    Assessment & Plan  DM Has appt with her endocrinologist later this month, no change to meds at this time  HYPOTHYROIDISM Well treated, is having this followed by endocrinology  OVERWEIGHT Tolerating Phentermine without any concerning side effects, given refills today and encouraged DASH diet and increased exercise.  Low back pain Tramadol and soma help some but not enough, will increase Tramadol to 100 mg po bid as needed and switch from Soma to Roxin encouraged moist heat, gentle stretching and increased exercise  ARTHRITIS, CHRONIC In back, hands, knees, encouraged fatty acids and increased exercise.

## 2012-12-21 NOTE — Assessment & Plan Note (Addendum)
Well treated, is having this followed by endocrinology

## 2012-12-21 NOTE — Assessment & Plan Note (Signed)
In back, hands, knees, encouraged fatty acids and increased exercise.

## 2012-12-21 NOTE — Assessment & Plan Note (Signed)
Tramadol and soma help some but not enough, will increase Tramadol to 100 mg po bid as needed and switch from Soma to Roxin encouraged moist heat, gentle stretching and increased exercise

## 2012-12-21 NOTE — Assessment & Plan Note (Signed)
Has appt with her endocrinologist later this month, no change to meds at this time

## 2012-12-25 ENCOUNTER — Ambulatory Visit
Admission: RE | Admit: 2012-12-25 | Discharge: 2012-12-25 | Disposition: A | Discharge status: Home / Self Care | Payer: Medicare Other | Source: Ambulatory Visit | Attending: Endocrinology | Admitting: Endocrinology

## 2012-12-25 DIAGNOSIS — E049 Nontoxic goiter, unspecified: Secondary | ICD-10-CM

## 2013-02-06 ENCOUNTER — Ambulatory Visit: Payer: Medicare Other | Admitting: Family Medicine

## 2013-02-14 ENCOUNTER — Encounter: Payer: Self-pay | Admitting: Family Medicine

## 2013-02-14 ENCOUNTER — Ambulatory Visit (INDEPENDENT_AMBULATORY_CARE_PROVIDER_SITE_OTHER): Payer: Medicare Other | Admitting: Family Medicine

## 2013-02-14 VITALS — BP 133/80 | HR 76 | Temp 98.1°F | Ht 66.25 in | Wt 197.1 lb

## 2013-02-14 DIAGNOSIS — E663 Overweight: Secondary | ICD-10-CM

## 2013-02-14 DIAGNOSIS — M25561 Pain in right knee: Secondary | ICD-10-CM

## 2013-02-14 DIAGNOSIS — M25569 Pain in unspecified knee: Secondary | ICD-10-CM

## 2013-02-14 DIAGNOSIS — E669 Obesity, unspecified: Secondary | ICD-10-CM

## 2013-02-14 DIAGNOSIS — E119 Type 2 diabetes mellitus without complications: Secondary | ICD-10-CM

## 2013-02-14 DIAGNOSIS — M129 Arthropathy, unspecified: Secondary | ICD-10-CM

## 2013-02-14 DIAGNOSIS — M549 Dorsalgia, unspecified: Secondary | ICD-10-CM

## 2013-02-14 MED ORDER — PHENTERMINE HCL 37.5 MG PO CAPS: 37.5000 mg | ORAL_CAPSULE | ORAL | Status: DC

## 2013-02-14 MED ORDER — BACLOFEN 20 MG PO TABS: 20.0000 mg | ORAL_TABLET | Freq: Two times a day (BID) | ORAL | Status: DC | PRN

## 2013-02-14 NOTE — Patient Instructions (Addendum)
Back Pain, Adult Low back pain is very common. About 1 in 5 people have back pain.The cause of low back pain is rarely dangerous. The pain often gets better over time.About half of people with a sudden onset of back pain feel better in just 2 weeks. About 8 in 10 people feel better by 6 weeks.  CAUSES Some common causes of back pain include:  Strain of the muscles or ligaments supporting the spine.  Wear and tear (degeneration) of the spinal discs.  Arthritis.  Direct injury to the back. DIAGNOSIS Most of the time, the direct cause of low back pain is not known.However, back pain can be treated effectively even when the exact cause of the pain is unknown.Answering your caregiver's questions about your overall health and symptoms is one of the most accurate ways to make sure the cause of your pain is not dangerous. If your caregiver needs more information, he or she may order lab work or imaging tests (X-rays or MRIs).However, even if imaging tests show changes in your back, this usually does not require surgery. HOME CARE INSTRUCTIONS For many people, back pain returns.Since low back pain is rarely dangerous, it is often a condition that people can learn to manageon their own.   Remain active. It is stressful on the back to sit or stand in one place. Do not sit, drive, or stand in one place for more than 30 minutes at a time. Take short walks on level surfaces as soon as pain allows.Try to increase the length of time you walk each day.  Do not stay in bed.Resting more than 1 or 2 days can delay your recovery.  Do not avoid exercise or work.Your body is made to move.It is not dangerous to be active, even though your back may hurt.Your back will likely heal faster if you return to being active before your pain is gone.  Pay attention to your body when you bend and lift. Many people have less discomfortwhen lifting if they bend their knees, keep the load close to their bodies,and  avoid twisting. Often, the most comfortable positions are those that put less stress on your recovering back.  Find a comfortable position to sleep. Use a firm mattress and lie on your side with your knees slightly bent. If you lie on your back, put a pillow under your knees.  Only take over-the-counter or prescription medicines as directed by your caregiver. Over-the-counter medicines to reduce pain and inflammation are often the most helpful.Your caregiver may prescribe muscle relaxant drugs.These medicines help dull your pain so you can more quickly return to your normal activities and healthy exercise.  Put ice on the injured area.  Put ice in a plastic bag.  Place a towel between your skin and the bag.  Leave the ice on for 15 to 20 minutes, 3 to 4 times a day for the first 2 to 3 days. After that, ice and heat may be alternated to reduce pain and spasms.  Ask your caregiver about trying back exercises and gentle massage. This may be of some benefit.  Avoid feeling anxious or stressed.Stress increases muscle tension and can worsen back pain.It is important to recognize when you are anxious or stressed and learn ways to manage it.Exercise is a great option. SEEK MEDICAL CARE IF:  You have pain that is not relieved with rest or medicine.  You have pain that does not improve in 1 week.  You have new symptoms.  You are generally   not feeling well. SEEK IMMEDIATE MEDICAL CARE IF:   You have pain that radiates from your back into your legs.  You develop new bowel or bladder control problems.  You have unusual weakness or numbness in your arms or legs.  You develop nausea or vomiting.  You develop abdominal pain.  You feel faint. Document Released: 12/06/2005 Document Revised: 06/06/2012 Document Reviewed: 04/26/2011 Exit[employer] Patient Information 2013 [employer].  

## 2013-02-14 NOTE — Assessment & Plan Note (Signed)
Steady weight loss with Phentermine. Encouraged DASH diet and increased exercise

## 2013-02-14 NOTE — Assessment & Plan Note (Addendum)
Encouraged increased exercise and krill oil daily. Continue Naproxen and Tramadol

## 2013-02-14 NOTE — Assessment & Plan Note (Signed)
Recent good control per patient.  

## 2013-02-14 NOTE — Progress Notes (Signed)
Patient ID: Amanda Carlson, female   DOB: April 08, 1946, 67 y.o.   MRN: [id number] Amanda Carlson [id number] 1946-04-02 02/14/2013      Progress Note-Follow Up  Subjective  Chief Complaint  Chief Complaint  Patient presents with  . Follow-up    HPI  Patient is a 67 year old Caucasian female who is here today in followup. She feels the phentermine does suppress her appetite well. She has eaten less and does feel she's moving slightly more. She denies any headache, palpitations, shortness of breath, insomnia, chest pain, GI or GU concerns with the medication. Her chronic complaint is pain. She has low back pain as well as radicular symptoms down the leg and chronic stiffness arthritis in her hands and arms most notable in the morning.  Past Medical History  Diagnosis Date  . Knee pain, right 10/16/2011  . Knee pain, bilateral 10/16/2011  . HYPOTHYROIDISM 01/22/2011    Qualifier: Diagnosis of  By: Abner Greenspan MD, Claria Dice with Dr Talmage Nap   . Overweight 01/22/2011    Qualifier: Diagnosis of  By: Abner Greenspan MD, Misty Stanley    . Intrinsic asthma, unspecified 01/22/2011    Qualifier: Diagnosis of  By: Abner Greenspan MD, Misty Stanley    . THYROID CYST 01/22/2011    Qualifier: Diagnosis of  By: Abner Greenspan MD, Misty Stanley    . ALLERGIC RHINITIS, SEASONAL 01/22/2011    Qualifier: Diagnosis of  By: Abner Greenspan MD, Misty Stanley    . GERD 01/22/2011    Qualifier: Diagnosis of  By: Abner Greenspan MD, Misty Stanley    . Irritable bowel syndrome 01/22/2011    Qualifier: Diagnosis of  By: Abner Greenspan MD, Misty Stanley    . CTS (carpal tunnel syndrome) 06/07/2012  . Tinea corporis 06/07/2012  . Insomnia 06/07/2012  . Low back pain 10/04/2012    Past Surgical History  Procedure Laterality Date  . Knee arthroscopy      right  . Cholecystectomy    . Tubal ligation    . Cervical fusion, discectomy, metal plate and 2 screws in place    . Thyroidectomy, partial      Family History  Problem Relation Age of Onset  . Atrial fibrillation Mother   . Other Mother     Back pain, CHF   . Arthritis Mother   . Osteoporosis Mother   . Heart disease Father   . Hypertension Father   . Diabetes Father   . Sleep apnea Father   . Atrial fibrillation Father     paroxysmal  . Diabetes Brother   . Diabetes Maternal Aunt     X 2 aunts  . Cancer Maternal Grandmother   . Diabetes Maternal Grandfather   . Stroke Paternal Grandmother   . Heart disease Paternal Grandfather   . Stroke Paternal Grandfather     History   Social History  . Marital Status: Divorced    Spouse Name: N/A    Number of Children: N/A  . Years of Education: N/A   Occupational History  . Not on file.   Social History Main Topics  . Smoking status: Never Smoker   . Smokeless tobacco: Never Used  . Alcohol Use: 0.0 oz/week    0 drink(s) per week     Comment: No intake of alcohol in greater than 10 yrs  . Drug Use: No  . Sexually Active: Not on file   Other Topics Concern  . Not on file   Social History Narrative  . No narrative on file    Current Outpatient Prescriptions on  File Prior to Visit  Medication Sig Dispense Refill  . albuterol (PROVENTIL HFA) 108 (90 BASE) MCG/ACT inhaler Inhale 2 puffs into the lungs as needed. For wheezing  2 Inhaler  2  . ALPRAZolam (XANAX) 0.25 MG tablet Take 1 tablet (0.25 mg total) by mouth daily as needed for sleep or anxiety. For anxiety  30 tablet  2  . aspirin 81 MG tablet Take 81 mg by mouth daily.      Marland Kitchen BIOTIN 5000 PO Take 1 tablet by mouth daily.      . Calcium Carbonate-Vitamin D (CALTRATE 600+D PO) Take 3 tablets by mouth daily.      . cetirizine (ZYRTEC) 10 MG tablet Take 10 mg by mouth daily.        . Cholecalciferol (VITAMIN D) 1000 UNITS capsule Take 1,000 Units by mouth daily. 6 days a week      . furosemide (LASIX) 20 MG tablet Take 1 tablet (20 mg total) by mouth as needed.  30 tablet  5  . glucosamine-chondroitin 500-400 MG tablet Take 2 tablets by mouth daily.      Marland Kitchen levothyroxine (SYNTHROID, LEVOTHROID) 112 MCG tablet Take 112 mcg by  mouth daily.      Marland Kitchen loperamide (IMODIUM) 2 MG capsule Take 2 mg by mouth as needed.      . metformin (FORTAMET) 500 MG (OSM) 24 hr tablet Take 500 mg by mouth 2 (two) times daily.        . naproxen (NAPROSYN) 375 MG tablet Take 1 tablet (375 mg total) by mouth 2 (two) times daily with a meal. With food. Cannot take on same day as Ibuprofen  60 tablet  2  . OVER THE COUNTER MEDICATION 300 mg daily. Mega Red      . simvastatin (ZOCOR) 40 MG tablet Take 1 tablet (40 mg total) by mouth at bedtime.  30 tablet  5  . traMADol (ULTRAM) 50 MG tablet Take 2 tablets (100 mg total) by mouth 2 (two) times daily as needed for pain. Max of 4 tabs in 24 hours as instructed  120 tablet  1  . vitamin E 400 UNIT capsule Take 1,200 Units by mouth daily.      Marland Kitchen zolpidem (AMBIEN) 10 MG tablet Take 1 tablet (10 mg total) by mouth at bedtime as needed for sleep.  30 tablet  5  . Fluticasone-Salmeterol (ADVAIR DISKUS) 250-50 MCG/DOSE AEPB Inhale 1 puff into the lungs once.  60 each  2   No current facility-administered medications on file prior to visit.    Allergies  Allergen Reactions  . Codeine     REACTION: causes itching  . Tetanus Toxoid     REACTION: NO Tetanus shot    Review of Systems  Review of Systems  Constitutional: Negative for fever and malaise/fatigue.  HENT: Negative for congestion.   Eyes: Negative for discharge.  Respiratory: Negative for shortness of breath.   Cardiovascular: Negative for chest pain, palpitations and leg swelling.  Gastrointestinal: Negative for nausea, abdominal pain and diarrhea.  Genitourinary: Negative for dysuria.  Musculoskeletal: Positive for back pain and joint pain. Negative for falls.  Skin: Negative for rash.  Neurological: Negative for loss of consciousness and headaches.  Endo/Heme/Allergies: Negative for polydipsia.  Psychiatric/Behavioral: Negative for depression and suicidal ideas. The patient is not nervous/anxious and does not have insomnia.      Objective  BP 133/80  Pulse 76  Temp(Src) 98.1 F (36.7 C) (Temporal)  Ht 5' 6.25" (1.683 m)  Wt 197 lb 1.9 oz (89.413 kg)  BMI 31.57 kg/m2  SpO2 99%  Physical Exam  Physical Exam  Constitutional: She is oriented to person, place, and time and well-developed, well-nourished, and in no distress. No distress.  HENT:  Head: Normocephalic and atraumatic.  Eyes: Conjunctivae are normal.  Neck: Neck supple. No thyromegaly present.  Cardiovascular: Normal rate, regular rhythm and normal heart sounds.   No murmur heard. Pulmonary/Chest: Effort normal and breath sounds normal. She has no wheezes.  Abdominal: She exhibits no distension and no mass.  Musculoskeletal: She exhibits no edema.  Lymphadenopathy:    She has no cervical adenopathy.  Neurological: She is alert and oriented to person, place, and time.  Skin: Skin is warm and dry. No rash noted. She is not diaphoretic.  Psychiatric: Memory, affect and judgment normal.    No results found for this basename: TSH   Lab Results  Component Value Date   WBC 7.5 10/04/2012   HGB 13.1 10/04/2012   HCT 40.5 10/04/2012   MCV 93.1 10/04/2012   PLT 222.0 10/04/2012   Lab Results  Component Value Date   CREATININE 0.7 10/04/2012   BUN 13 10/04/2012   NA 140 10/04/2012   K 4.0 10/04/2012   CL 106 10/04/2012   CO2 28 10/04/2012     Assessment & Plan  OVERWEIGHT Steady weight loss with Phentermine. Encouraged DASH diet and increased exercise  Knee pain, bilateral Encouraged increased exercise and krill oil daily. Continue Naproxen and Tramadol  DM Recent good control per patient  ARTHRITIS, CHRONIC Back pain is persistent consider PT, continue current meds, try moist heat and gentle stretching

## 2013-02-14 NOTE — Assessment & Plan Note (Addendum)
Back pain is persistent consider PT, continue current meds, try moist heat and gentle stretching

## 2013-02-19 ENCOUNTER — Ambulatory Visit: Payer: Medicare Other | Admitting: Family Medicine

## 2013-02-23 ENCOUNTER — Ambulatory Visit: Payer: Medicare Other | Admitting: Family Medicine

## 2013-03-01 ENCOUNTER — Telehealth: Payer: Self-pay

## 2013-03-01 NOTE — Telephone Encounter (Signed)
Patient states she started taking Krill Oil 300 mg daily. How much should patient take? Patient has recently seen that there is 500 mg and 1000 mg. Please advise?  Per pt if she doesn't answer leave message on machine.

## 2013-03-01 NOTE — Telephone Encounter (Signed)
Every preparation is slightly different so there is not an absolute number. Follow the directions on the bottle of the brand you purchased.

## 2013-03-01 NOTE — Telephone Encounter (Signed)
Patient informed. 

## 2013-03-07 ENCOUNTER — Other Ambulatory Visit: Payer: Self-pay | Admitting: Family Medicine

## 2013-04-05 ENCOUNTER — Encounter: Payer: Self-pay | Admitting: Family Medicine

## 2013-04-24 ENCOUNTER — Other Ambulatory Visit: Payer: Self-pay | Admitting: Family Medicine

## 2013-04-24 NOTE — Telephone Encounter (Signed)
Alprazolam request [Last Rx 10.31.13 #30x2]/SLS Tramadol request [Last Rx 01.02.14 #120x1]/SLS Please advise.

## 2013-05-07 ENCOUNTER — Encounter: Payer: Self-pay | Admitting: Family Medicine

## 2013-05-24 ENCOUNTER — Other Ambulatory Visit: Payer: Self-pay | Admitting: Family Medicine

## 2013-05-24 ENCOUNTER — Other Ambulatory Visit: Payer: Self-pay

## 2013-05-24 DIAGNOSIS — E669 Obesity, unspecified: Secondary | ICD-10-CM

## 2013-05-24 MED ORDER — PHENTERMINE HCL 37.5 MG PO CAPS: 37.5000 mg | ORAL_CAPSULE | ORAL | Status: DC

## 2013-05-24 NOTE — Telephone Encounter (Signed)
Please advise refill? Last RX was done on 02-14-13 quantity 30 with 1 refill  If ok fax to [phone]

## 2013-05-28 ENCOUNTER — Telehealth: Payer: Self-pay

## 2013-05-28 NOTE — Telephone Encounter (Signed)
PA for Ambien sent to [facility]

## 2013-05-30 NOTE — Telephone Encounter (Signed)
Approved 05-08-13 to 05-29-14

## 2013-05-30 NOTE — Telephone Encounter (Signed)
This is "doned" on accident

## 2013-06-13 ENCOUNTER — Encounter: Payer: Self-pay | Admitting: Family Medicine

## 2013-06-15 ENCOUNTER — Telehealth: Payer: Self-pay | Admitting: Family Medicine

## 2013-06-15 MED ORDER — FLUTICASONE-SALMETEROL 250-50 MCG/DOSE IN AEPB: INHALATION_SPRAY | RESPIRATORY_TRACT | Status: DC

## 2013-06-15 NOTE — Telephone Encounter (Signed)
Refill-advair 250-50 diskus. Inhale one puff into the lungs once daily. Qty 60 last fill 6.3.14

## 2013-07-04 ENCOUNTER — Telehealth: Payer: Self-pay | Admitting: Family Medicine

## 2013-07-04 NOTE — Telephone Encounter (Signed)
Patient states that she would like to change her CPAP mask. She states that [facility] told her to call us.

## 2013-07-04 NOTE — Telephone Encounter (Signed)
Please advise 

## 2013-07-04 NOTE — Telephone Encounter (Signed)
Please call [facility] and see what kind of order they need from [place] to get patient the new  Mask she needs

## 2013-07-09 NOTE — Telephone Encounter (Signed)
I tried to call and was on hold for 10 minutes. I had to hang up to get a patient. I will try again later

## 2013-07-11 ENCOUNTER — Other Ambulatory Visit: Payer: Self-pay | Admitting: Endocrinology

## 2013-07-11 DIAGNOSIS — E049 Nontoxic goiter, unspecified: Secondary | ICD-10-CM

## 2013-07-12 NOTE — Telephone Encounter (Signed)
I spoke to Erskine Squibb at [facility] with respiratory and she stated pt was in yesterday and got fitted for a new mask.   Erskine Squibb stated if there are any other questions to call [facility] and ask for [phone]

## 2013-07-20 ENCOUNTER — Other Ambulatory Visit: Payer: Self-pay | Admitting: Family Medicine

## 2013-07-20 NOTE — Telephone Encounter (Signed)
Faxed [facility] to [facility].../lmb 

## 2013-07-27 ENCOUNTER — Telehealth: Payer: Self-pay | Admitting: Family Medicine

## 2013-07-27 DIAGNOSIS — E669 Obesity, unspecified: Secondary | ICD-10-CM

## 2013-07-27 NOTE — Telephone Encounter (Signed)
Refill-phentermine 37.5mg  tablet. Take one tablet by mouth every morning. Last fill 5.6.14

## 2013-07-28 NOTE — Telephone Encounter (Signed)
Denied RX for Phentermine- pt hasn't been seen since Feb 14. Pt must make an appt

## 2013-08-01 ENCOUNTER — Encounter: Payer: Self-pay | Admitting: Family Medicine

## 2013-08-22 ENCOUNTER — Other Ambulatory Visit: Payer: Self-pay | Admitting: Family Medicine

## 2013-08-22 NOTE — Telephone Encounter (Signed)
RX faxed

## 2013-08-22 NOTE — Telephone Encounter (Signed)
Please advise refill? Last RX was done on 04-24-13 quantity 120 with 1 refill  If ok fax to [phone]

## 2013-09-18 ENCOUNTER — Emergency Department (HOSPITAL_COMMUNITY)
Admission: EM | Admit: 2013-09-18 | Discharge: 2013-09-19 | Disposition: A | Discharge status: Home / Self Care | Payer: Medicare Other | Attending: Emergency Medicine | Admitting: Emergency Medicine

## 2013-09-18 ENCOUNTER — Encounter (HOSPITAL_COMMUNITY): Payer: Self-pay | Admitting: Emergency Medicine

## 2013-09-18 DIAGNOSIS — Z8719 Personal history of other diseases of the digestive system: Secondary | ICD-10-CM | POA: Insufficient documentation

## 2013-09-18 DIAGNOSIS — Z79899 Other long term (current) drug therapy: Secondary | ICD-10-CM | POA: Insufficient documentation

## 2013-09-18 DIAGNOSIS — E119 Type 2 diabetes mellitus without complications: Secondary | ICD-10-CM | POA: Insufficient documentation

## 2013-09-18 DIAGNOSIS — K148 Other diseases of tongue: Secondary | ICD-10-CM

## 2013-09-18 DIAGNOSIS — Z8669 Personal history of other diseases of the nervous system and sense organs: Secondary | ICD-10-CM | POA: Insufficient documentation

## 2013-09-18 DIAGNOSIS — R220 Localized swelling, mass and lump, head: Secondary | ICD-10-CM | POA: Insufficient documentation

## 2013-09-18 DIAGNOSIS — Z8619 Personal history of other infectious and parasitic diseases: Secondary | ICD-10-CM | POA: Insufficient documentation

## 2013-09-18 DIAGNOSIS — IMO0002 Reserved for concepts with insufficient information to code with codable children: Secondary | ICD-10-CM | POA: Insufficient documentation

## 2013-09-18 DIAGNOSIS — E663 Overweight: Secondary | ICD-10-CM | POA: Insufficient documentation

## 2013-09-18 DIAGNOSIS — Z7982 Long term (current) use of aspirin: Secondary | ICD-10-CM | POA: Insufficient documentation

## 2013-09-18 DIAGNOSIS — Z8739 Personal history of other diseases of the musculoskeletal system and connective tissue: Secondary | ICD-10-CM | POA: Insufficient documentation

## 2013-09-18 DIAGNOSIS — Z792 Long term (current) use of antibiotics: Secondary | ICD-10-CM | POA: Insufficient documentation

## 2013-09-18 HISTORY — DX: Sleep apnea, unspecified: G47.30

## 2013-09-18 HISTORY — DX: Type 2 diabetes mellitus without complications: E11.9

## 2013-09-18 NOTE — ED Notes (Signed)
Pt states she went to the oral surgeon today to have a some precancerous spots burned on her tongue  Pt states it went well  Came home and was doing well and tonight she noticed her tongue was swelling

## 2013-09-19 LAB — BASIC METABOLIC PANEL
BUN: 15 mg/dL (ref 6–23)
CO2: 26 mEq/L (ref 19–32)
Chloride: 99 mEq/L (ref 96–112)
Glucose, Bld: 135 mg/dL — ABNORMAL HIGH (ref 70–99)
Potassium: 4.2 mEq/L (ref 3.5–5.1)

## 2013-09-19 LAB — CBC WITH DIFFERENTIAL/PLATELET
Basophils Absolute: 0 10*3/uL (ref 0.0–0.1)
Eosinophils Absolute: 0.2 10*3/uL (ref 0.0–0.7)
Hemoglobin: 13.9 g/dL (ref 12.0–15.0)
Lymphs Abs: 2.9 10*3/uL (ref 0.7–4.0)
MCH: 30.6 pg (ref 26.0–34.0)
Monocytes Relative: 7 % (ref 3–12)
Neutrophils Relative %: 69 % (ref 43–77)
Platelets: 258 10*3/uL (ref 150–400)
RBC: 4.54 MIL/uL (ref 3.87–5.11)
RDW: 13.6 % (ref 11.5–15.5)

## 2013-09-19 MED ORDER — METHYLPREDNISOLONE SODIUM SUCC 125 MG IJ SOLR
125.0000 mg | Freq: Once | INTRAMUSCULAR | Status: AC
  Administered 2013-09-19: 125 mg via INTRAVENOUS

## 2013-09-19 MED ORDER — METHYLPREDNISOLONE SODIUM SUCC 125 MG IJ SOLR
INTRAMUSCULAR | Status: AC
  Administered 2013-09-19: 125 mg via INTRAVENOUS
  Filled 2013-09-19: qty 2

## 2013-09-19 MED ORDER — PREDNISONE 20 MG PO TABS: 20.0000 mg | ORAL_TABLET | Freq: Two times a day (BID) | ORAL | Status: DC

## 2013-09-19 NOTE — ED Notes (Signed)
Assumed care of Amanda Carlson Amanda Carlson with c/o swelling to the left side of her tongue s/p procedure at oral sx office Amanda Carlson states "They burned off some spots on my tongue and then it started to swell up a little while ago at home." Amanda Carlson able to speak in full and complete sentences without difficulty--handles secretions and able to swallow Amanda Carlson denies difficulty breathing or tightness in throat Wound the size of a dime noted to left side of tongue--no bleeding, draining or oozing noted LCTA bilaterally Amanda Carlson currently rates pain 5/10 and states that she took 2 tabs Norco around 5pm, prior to arrival Amanda Carlson appears in NAD Awaiting provider Call bell in reach

## 2013-09-19 NOTE — ED Provider Notes (Signed)
Medical screening examination/treatment/procedure(s) were performed by non-physician practitioner and as supervising physician I was immediately available for consultation/collaboration.  Sunnie Nielsen, MD 09/19/13 [phone]

## 2013-09-19 NOTE — ED Notes (Signed)
Provider at bedside

## 2013-09-19 NOTE — ED Notes (Signed)
Provider back at bedside to explain plan of care to patient Patient left room to tell family in waiting room that they can go home Per provider, patient to be monitored over night Will make charge nurse aware

## 2013-09-19 NOTE — ED Notes (Signed)
Patient up to bathroom without difficulty or assistance Gait steady Patient states that she feels swelling to left side of tongue has decreased s/p admin of medication, see MAR Improvement in swelling noted upon assessment Patient denies c/o pain Patient denies further needs at this time Call bell in reach

## 2013-09-19 NOTE — ED Provider Notes (Signed)
CSN: [id number]     Arrival date & time 09/18/13  2248 History   First MD Initiated Contact with Patient 09/19/13 0044     Chief Complaint  Patient presents with  . Oral Swelling   (Consider location/radiation/quality/duration/timing/severity/associated sxs/prior Treatment) HPI History provided by pt.   Pt had precancerous lesions on left lateral tongue burned off yesterday at 8am.  Developed edema of left side of tongue at ~7pm.  Mildly painful.  Denies dyspnea, throat tightness, rash.  No known allergies or new contacts.   Past Medical History  Diagnosis Date  . Knee pain, right 10/16/2011  . Knee pain, bilateral 10/16/2011  . HYPOTHYROIDISM 01/22/2011    Qualifier: Diagnosis of  By: Abner Greenspan MD, Claria Dice with Dr Talmage Nap   . Overweight(278.02) 01/22/2011    Qualifier: Diagnosis of  By: Abner Greenspan MD, Misty Stanley    . Intrinsic asthma, unspecified 01/22/2011    Qualifier: Diagnosis of  By: Abner Greenspan MD, Misty Stanley    . THYROID CYST 01/22/2011    Qualifier: Diagnosis of  By: Abner Greenspan MD, Misty Stanley    . ALLERGIC RHINITIS, SEASONAL 01/22/2011    Qualifier: Diagnosis of  By: Abner Greenspan MD, Misty Stanley    . GERD 01/22/2011    Qualifier: Diagnosis of  By: Abner Greenspan MD, Misty Stanley    . Irritable bowel syndrome 01/22/2011    Qualifier: Diagnosis of  By: Abner Greenspan MD, Misty Stanley    . CTS (carpal tunnel syndrome) 06/07/2012  . Tinea corporis 06/07/2012  . Insomnia 06/07/2012  . Low back pain 10/04/2012  . Diabetes mellitus without complication   . Sleep apnea    Past Surgical History  Procedure Laterality Date  . Knee arthroscopy      right  . Cholecystectomy    . Tubal ligation    . Cervical fusion, discectomy, metal plate and 2 screws in place    . Thyroidectomy, partial     Family History  Problem Relation Age of Onset  . Atrial fibrillation Mother   . Other Mother     Back pain, CHF  . Arthritis Mother   . Osteoporosis Mother   . Heart disease Father   . Hypertension Father   . Diabetes Father   . Sleep apnea Father   . Atrial  fibrillation Father     paroxysmal  . Diabetes Brother   . Diabetes Maternal Aunt     X 2 aunts  . Cancer Maternal Grandmother   . Diabetes Maternal Grandfather   . Stroke Paternal Grandmother   . Heart disease Paternal Grandfather   . Stroke Paternal Grandfather    History  Substance Use Topics  . Smoking status: Never Smoker   . Smokeless tobacco: Never Used  . Alcohol Use: 0.0 oz/week    0 drink(s) per week     Comment: No intake of alcohol in greater than 10 yrs   OB History   Grav Para Term Preterm Abortions TAB SAB Ect Mult Living                 Review of Systems  All other systems reviewed and are negative.    Allergies  Codeine; Tetanus toxoid; and Adhesive  Home Medications   Current Outpatient Rx  Name  Route  Sig  Dispense  Refill  . albuterol (PROVENTIL HFA;VENTOLIN HFA) 108 (90 BASE) MCG/ACT inhaler   Inhalation   Inhale 2 puffs into the lungs every 6 (six) hours as needed for wheezing.         Marland Kitchen ALPRAZolam (  XANAX) 0.25 MG tablet   Oral   Take 0.25 mg by mouth at bedtime as needed for sleep.         Marland Kitchen amoxicillin (AMOXIL) 500 MG tablet   Oral   Take 2,000 mg by mouth once as needed. When go to the dentist         . aspirin 81 MG tablet   Oral   Take 81 mg by mouth daily.         . baclofen (LIORESAL) 20 MG tablet   Oral   Take 20 mg by mouth 2 (two) times daily as needed (muscle pain).         . cetirizine (ZYRTEC) 10 MG tablet   Oral   Take 10 mg by mouth daily.          . Cholecalciferol (VITAMIN D) 1000 UNITS capsule   Oral   Take 2,000 Units by mouth every other day.          . Fluticasone-Salmeterol (ADVAIR) 250-50 MCG/DOSE AEPB   Inhalation   Inhale 1 puff into the lungs every 12 (twelve) hours.         . furosemide (LASIX) 20 MG tablet   Oral   Take 20 mg by mouth daily as needed for fluid.         Marland Kitchen HYDROcodone-acetaminophen (NORCO/VICODIN) 5-325 MG per tablet   Oral   Take 1 tablet by mouth every 6  (six) hours as needed for pain.         Boris Lown Oil 300 MG CAPS   Oral   Take 300 mg by mouth daily.         Marland Kitchen levothyroxine (SYNTHROID, LEVOTHROID) 112 MCG tablet   Oral   Take 112 mcg by mouth daily.         . metFORMIN (GLUCOPHAGE-XR) 500 MG 24 hr tablet   Oral   Take 500 mg by mouth 2 (two) times daily.         . naproxen (NAPROSYN) 375 MG tablet   Oral   Take 375 mg by mouth 2 (two) times daily as needed (pain).         . phentermine 37.5 MG capsule   Oral   Take 37.5 mg by mouth every morning.         . simvastatin (ZOCOR) 40 MG tablet   Oral   Take 40 mg by mouth every evening.         . sodium chloride (OCEAN) 0.65 % nasal spray   Nasal   Place 1 spray into the nose 2 (two) times daily as needed for congestion.         . traMADol (ULTRAM) 50 MG tablet   Oral   Take 50 mg by mouth 2 (two) times daily as needed for pain.         Marland Kitchen zolpidem (AMBIEN) 10 MG tablet   Oral   Take 10 mg by mouth at bedtime.          BP 139/74  Pulse 73  Temp(Src) 98 F (36.7 C) (Oral)  Resp 14  SpO2 98% Physical Exam  Nursing note and vitals reviewed. Constitutional: She is oriented to person, place, and time. She appears well-developed and well-nourished. No distress.  HENT:  Head: Normocephalic and atraumatic.  Mouth/Throat: Oropharynx is clear and moist and mucous membranes are normal. No posterior oropharyngeal edema.  Edema left side of tongue.  Ulcerated lesions lateral tongue.  Mildly ttp.  No lip edema.  Eyes:  Normal appearance  Neck: Normal range of motion.  Cardiovascular: Normal rate and regular rhythm.   Pulmonary/Chest: Effort normal and breath sounds normal. No stridor. No respiratory distress.  Musculoskeletal: Normal range of motion.  Neurological: She is alert and oriented to person, place, and time.  Skin: Skin is warm and dry. No rash noted.  Psychiatric: She has a normal mood and affect. Her behavior is normal.    ED Course   Procedures (including critical care time) Labs Review Labs Reviewed - No data to display Imaging Review No results found.  MDM   1. Tongue edema    67yo F presents w/ L tongue edema s/p precancerous cell removal by oral surgeon yesterday am.  Tongue mildly ttp.  No airway compromise currently.  No other s/sx allergic reaction.  Will treat w/ IV Solumedrol and monitor.   Pt reports that her tongue feels less swollen and she is able to talk easier.  On re-examination, minimal if any objective improvement.  Otherwise stable. Will reassess in 1 hour.  4:30 AM   Pt stable.  Again, no objective improvement on exam.  It has been approximately 4 hours since she received IV solumedrol She feels most comfortable with being monitored for one more hour.  [place], PA-C to resume care.  Pt will be d/c'd home w/ prednisone and contact her oral surgeon today.  6:03 AM    Otilio Miu, PA-C 09/19/13 [phone]

## 2013-09-19 NOTE — ED Notes (Signed)
Bed: ZO10 Expected date:  Expected time:  Means of arrival:  Comments: Hold for rm 4

## 2013-10-12 ENCOUNTER — Other Ambulatory Visit: Payer: Self-pay | Admitting: Family Medicine

## 2013-10-12 NOTE — Telephone Encounter (Signed)
eScribe request for refill on Zolpidem Last filled - 08.01.14, #30x1 [Noted pt needed OV for future refills] Last AEX - 02.26.14 Next AEX - 2 Months Please Advise/SLS

## 2013-10-15 ENCOUNTER — Other Ambulatory Visit: Payer: Self-pay | Admitting: Family Medicine

## 2013-10-24 ENCOUNTER — Other Ambulatory Visit: Payer: Self-pay | Admitting: Family Medicine

## 2013-10-24 NOTE — Telephone Encounter (Signed)
She needs to be seen soon if she wants to stay on this, she can have #10 Alpraxolam til seen, I have not seen her since February

## 2013-10-24 NOTE — Telephone Encounter (Signed)
Ok to refill 

## 2013-10-24 NOTE — Telephone Encounter (Signed)
I signed #10

## 2013-11-28 ENCOUNTER — Other Ambulatory Visit: Payer: Self-pay | Admitting: Family Medicine

## 2013-11-28 DIAGNOSIS — F419 Anxiety disorder, unspecified: Secondary | ICD-10-CM

## 2013-11-28 MED ORDER — ALPRAZOLAM 0.25 MG PO TABS: ORAL_TABLET | ORAL | Status: DC

## 2013-11-28 NOTE — Telephone Encounter (Signed)
Pt request refill on traMADol (ULTRAM) 50 MG tablet [[id number]] and ALPRAZolam (XANAX) 0.25 MG tablet, asking for Qty 30 on Xanax. Pt request call back once done.

## 2013-11-28 NOTE — Telephone Encounter (Signed)
Please advise refills?  I don't see where Dr Abner Greenspan has gave Tramadol recently?  Last RX for Alprazolam is 10-24-13 quantity 10 with 0 refills  If ok fax to [phone]

## 2013-11-28 NOTE — Telephone Encounter (Signed)
RX faxed

## 2013-11-28 NOTE — Telephone Encounter (Signed)
Refill for Xanax given, quantity 10 with no additional refills.  Refill request for Tramadol denied.  Patient has not been seen since 01/2013.  Needs follow-up appointment with Dr. Abner Greenspan.  I am only giving the xanax so she is not left with severe anxiety until she has a follow-up appointment.

## 2013-11-29 NOTE — Telephone Encounter (Signed)
Tramadol hcl 50 mg tablet take 2 tablets by mouth twice a day as needed for pain do not exceed 4  [facility] [address] [place]  Fax [phone]

## 2013-12-05 NOTE — Telephone Encounter (Signed)
Tramadol hcl 50 mg tablet take 2 tablets by mouth twice a day as needed for pain do not exceed 4 tabs daily qty 120  Last fill 10-24-2013

## 2013-12-06 NOTE — Telephone Encounter (Signed)
She can have tramadol 50 mg tabs 1 tab po bid prn, #20 tabs but any more than that she needs to be seen

## 2013-12-06 NOTE — Telephone Encounter (Signed)
Please advise 

## 2013-12-07 MED ORDER — TRAMADOL HCL 50 MG PO TABS: 50.0000 mg | ORAL_TABLET | Freq: Two times a day (BID) | ORAL | Status: DC | PRN

## 2013-12-07 NOTE — Telephone Encounter (Signed)
RX sent.  Left a message on vm for pt to return my call

## 2014-01-07 ENCOUNTER — Ambulatory Visit
Admission: RE | Admit: 2014-01-07 | Discharge: 2014-01-07 | Disposition: A | Discharge status: Home / Self Care | Payer: Medicare Other | Source: Ambulatory Visit | Attending: Endocrinology | Admitting: Endocrinology

## 2014-01-07 DIAGNOSIS — E049 Nontoxic goiter, unspecified: Secondary | ICD-10-CM

## 2014-01-10 ENCOUNTER — Encounter: Payer: Self-pay | Admitting: Family Medicine

## 2014-01-12 ENCOUNTER — Telehealth: Payer: Self-pay | Admitting: Family Medicine

## 2014-01-12 NOTE — Telephone Encounter (Signed)
Her appt was cancelled and rescheduled to 02-18-2014.  When she got her last refill of her pain and nerve meds she was only given 10 at a refill.  She cant make it to March like that and it is very expensive getting 10 at a time.  She wants a refill for 30 at a time till her appt.  Also Dr B talked about physical therapy for her.  She would like a referral to start pt now

## 2014-01-14 ENCOUNTER — Other Ambulatory Visit: Payer: Self-pay | Admitting: Family Medicine

## 2014-01-14 ENCOUNTER — Other Ambulatory Visit: Payer: Self-pay | Admitting: Physician Assistant

## 2014-01-14 DIAGNOSIS — M545 Low back pain, unspecified: Secondary | ICD-10-CM

## 2014-01-14 NOTE — Telephone Encounter (Signed)
Refill-baclofen 20mg  tablet. Take one tablet by mouth twice a day as needed. Qty 60 last fill 6.21.14

## 2014-01-14 NOTE — Telephone Encounter (Signed)
She can have 30 of Alprazolam and 30 day supply of Tramadol with 1 months of refill til seen in March

## 2014-01-14 NOTE — Telephone Encounter (Signed)
eScribe request from [facility] for refill on Zolpidem Last filled - 10.24.14, #30x1 Last AEX - 02.26.14 [at [place]] Next AEX - 73-months Please Advise/SLS

## 2014-01-14 NOTE — Telephone Encounter (Signed)
Please advise 

## 2014-01-15 ENCOUNTER — Other Ambulatory Visit: Payer: Self-pay | Admitting: Family Medicine

## 2014-01-15 ENCOUNTER — Other Ambulatory Visit: Payer: Self-pay | Admitting: Endocrinology

## 2014-01-15 ENCOUNTER — Telehealth: Payer: Self-pay | Admitting: Family Medicine

## 2014-01-15 ENCOUNTER — Other Ambulatory Visit: Payer: Self-pay | Admitting: Physician Assistant

## 2014-01-15 DIAGNOSIS — E041 Nontoxic single thyroid nodule: Secondary | ICD-10-CM

## 2014-01-15 NOTE — Telephone Encounter (Signed)
proair hfa 90 mcg inhaler  Furosemide 20 mg tablets qty 30  Tramadol hcl 50 mg tablet

## 2014-01-15 NOTE — Telephone Encounter (Signed)
baclopen 20 mg tablet qty 60 take 1 tablet by mouth twice a day as needed last fill 06-09-2013

## 2014-01-15 NOTE — Telephone Encounter (Signed)
SHE IS CALLING AGAIN TO BE SURE HER REFILLS FOR ALPRAZOLAM AND TRAMADOL WILL BE FOR A QUANTITY OF 30 NOT 10.  THE 10 PILL REFILL MAKES HER MEDS COST TOO MUCH.

## 2014-01-15 NOTE — Telephone Encounter (Signed)
I wrote her Alprazolam does she need Tramadol written for more also, her last rx was for 20, if she needs it rewritten for 30 that is OK

## 2014-01-15 NOTE — Telephone Encounter (Signed)
[facility] faxed.   What about the referral for physical therapy?

## 2014-01-16 MED ORDER — ALBUTEROL SULFATE HFA 108 (90 BASE) MCG/ACT IN AERS: 2.0000 | INHALATION_SPRAY | Freq: Four times a day (QID) | RESPIRATORY_TRACT | Status: DC | PRN

## 2014-01-16 MED ORDER — FUROSEMIDE 20 MG PO TABS: 20.0000 mg | ORAL_TABLET | Freq: Every day | ORAL | Status: DC | PRN

## 2014-01-16 NOTE — Telephone Encounter (Signed)
Refill request for Tramadol Last filled by MD on - 12.19.14, #20x0 Refill request for Baclofen, Furosemide & ProAir Last filled by MD on - ALL historical listing only in EMR list  Last AEX - 02.26.2014 Next AEX - Pt was to return in 2 months [04.26.2014]; does not have appt scheduled until 03.02.2015 Please Advise/SLS

## 2014-01-16 NOTE — Telephone Encounter (Signed)
Furosemide & Albuterol escribe to pharmacy with 1 refill; Tramadol & Baclofen Denial to pharmacy [pt must have OV before authorization on refills] per provider instructions/SLS

## 2014-01-16 NOTE — Telephone Encounter (Signed)
So cannot have pain meds (Tramadol or Baclofen) because not seen in past 6 months, needs to be seen can have 2 months worth of refills on Albuterol and Lasix, same sig with 30 day supply and 1 rf til seen

## 2014-01-16 NOTE — Telephone Encounter (Signed)
Furosemide & Albuterol escribe to pharmacy with 1 refill; Tramadol & Baclofen Denial to pharmacy [pt must have OV before authorization on refills] per provider instructions/SLS Mosie Lukes, MD at 01/16/2014  1:54 PM      Status: Signed            So cannot have pain meds (Tramadol or Baclofen) because not seen in past 6 months, needs to be seen can have 2 months worth of refills on Albuterol and Lasix, same sig with 30 day supply and 1 rf til seen         Rockwell Germany, CMA at 01/16/2014 10:19 AM      Status: Signed            Refill request for Tramadol Last filled by MD on - 12.19.14, #20x0 Refill request for Baclofen, Furosemide & ProAir Last filled by MD on - ALL historical listing only in EMR list   Last AEX - 02.26.2014 Next AEX - Pt was to return in 2 months [04.26.2014]; does not have appt scheduled until 03.02.2015 Please Advise/SLS

## 2014-01-18 ENCOUNTER — Ambulatory Visit (INDEPENDENT_AMBULATORY_CARE_PROVIDER_SITE_OTHER): Payer: Medicare Other | Admitting: Family Medicine

## 2014-01-18 ENCOUNTER — Encounter: Payer: Self-pay | Admitting: Family Medicine

## 2014-01-18 VITALS — BP 122/72 | HR 76 | Temp 98.3°F | Ht 66.25 in | Wt 192.1 lb

## 2014-01-18 DIAGNOSIS — J029 Acute pharyngitis, unspecified: Secondary | ICD-10-CM

## 2014-01-18 DIAGNOSIS — E039 Hypothyroidism, unspecified: Secondary | ICD-10-CM

## 2014-01-18 DIAGNOSIS — N649 Disorder of breast, unspecified: Secondary | ICD-10-CM

## 2014-01-18 DIAGNOSIS — M545 Low back pain, unspecified: Secondary | ICD-10-CM

## 2014-01-18 DIAGNOSIS — Z1239 Encounter for other screening for malignant neoplasm of breast: Secondary | ICD-10-CM | POA: Insufficient documentation

## 2014-01-18 DIAGNOSIS — M5410 Radiculopathy, site unspecified: Secondary | ICD-10-CM

## 2014-01-18 DIAGNOSIS — IMO0002 Reserved for concepts with insufficient information to code with codable children: Secondary | ICD-10-CM

## 2014-01-18 DIAGNOSIS — C029 Malignant neoplasm of tongue, unspecified: Secondary | ICD-10-CM

## 2014-01-18 DIAGNOSIS — E785 Hyperlipidemia, unspecified: Secondary | ICD-10-CM

## 2014-01-18 DIAGNOSIS — E669 Obesity, unspecified: Secondary | ICD-10-CM

## 2014-01-18 DIAGNOSIS — K148 Other diseases of tongue: Secondary | ICD-10-CM

## 2014-01-18 DIAGNOSIS — E041 Nontoxic single thyroid nodule: Secondary | ICD-10-CM

## 2014-01-18 DIAGNOSIS — E119 Type 2 diabetes mellitus without complications: Secondary | ICD-10-CM

## 2014-01-18 HISTORY — DX: Encounter for other screening for malignant neoplasm of breast: Z12.39

## 2014-01-18 HISTORY — DX: Malignant neoplasm of tongue, unspecified: C02.9

## 2014-01-18 HISTORY — DX: Disorder of breast, unspecified: N64.9

## 2014-01-18 HISTORY — DX: Other diseases of tongue: K14.8

## 2014-01-18 MED ORDER — AMOXICILLIN 500 MG PO CAPS: 500.0000 mg | ORAL_CAPSULE | Freq: Three times a day (TID) | ORAL | Status: DC

## 2014-01-18 MED ORDER — METHYLPREDNISOLONE ACETATE 40 MG/ML IJ SUSP
40.0000 mg | Freq: Once | INTRAMUSCULAR | Status: AC
  Administered 2014-01-18: 40 mg via INTRAMUSCULAR

## 2014-01-18 MED ORDER — TRAMADOL HCL 50 MG PO TABS: 50.0000 mg | ORAL_TABLET | Freq: Two times a day (BID) | ORAL | Status: DC | PRN

## 2014-01-18 MED ORDER — METHYLPREDNISOLONE ACETATE 40 MG/ML INJ SUSP (RADIOLOG: 120.0000 mg | Freq: Once | INTRAMUSCULAR | Status: DC

## 2014-01-18 MED ORDER — PHENTERMINE HCL 37.5 MG PO CAPS: 37.5000 mg | ORAL_CAPSULE | ORAL | Status: DC

## 2014-01-18 MED ORDER — METHOCARBAMOL 500 MG PO TABS: 500.0000 mg | ORAL_TABLET | Freq: Three times a day (TID) | ORAL | Status: DC | PRN

## 2014-01-18 NOTE — Assessment & Plan Note (Signed)
Dr Suzette Battiest of endocrine has monitored, is scheduled for biopsy of enlarging thyroid nodule next month. Levothyroxine dose is stable

## 2014-01-18 NOTE — Progress Notes (Signed)
Pre visit review using our clinic review tool, if applicable. No additional management support is needed unless otherwise documented below in the visit note. 

## 2014-01-18 NOTE — Patient Instructions (Signed)
Probiotic daily such as Digestive Advantage, Align Increase hydration  Pharyngitis Pharyngitis is redness, pain, and swelling (inflammation) of your pharynx.  CAUSES  Pharyngitis is usually caused by infection. Most of the time, these infections are from viruses (viral) and are part of a cold. However, sometimes pharyngitis is caused by bacteria (bacterial). Pharyngitis can also be caused by allergies. Viral pharyngitis may be spread from person to person by coughing, sneezing, and personal items or utensils (cups, forks, spoons, toothbrushes). Bacterial pharyngitis may be spread from person to person by more intimate contact, such as kissing.  SIGNS AND SYMPTOMS  Symptoms of pharyngitis include:   Sore throat.   Tiredness (fatigue).   Low-grade fever.   Headache.  Joint pain and muscle aches.  Skin rashes.  Swollen lymph nodes.  Plaque-like film on throat or tonsils (often seen with bacterial pharyngitis). DIAGNOSIS  Your health care provider will ask you questions about your illness and your symptoms. Your medical history, along with a physical exam, is often all that is needed to diagnose pharyngitis. Sometimes, a rapid strep test is done. Other lab tests may also be done, depending on the suspected cause.  TREATMENT  Viral pharyngitis will usually get better in 3 4 days without the use of medicine. Bacterial pharyngitis is treated with medicines that kill germs (antibiotics).  HOME CARE INSTRUCTIONS   Drink enough water and fluids to keep your urine clear or pale yellow.   Only take over-the-counter or prescription medicines as directed by your health care provider:   If you are prescribed antibiotics, make sure you finish them even if you start to feel better.   Do not take aspirin.   Get lots of rest.   Gargle with 8 oz of salt water ( tsp of salt per 1 qt of water) as often as every 1 2 hours to soothe your throat.   Throat lozenges (if you are not at risk  for choking) or sprays may be used to soothe your throat. SEEK MEDICAL CARE IF:   You have large, tender lumps in your neck.  You have a rash.  You cough up green, yellow-brown, or bloody spit. SEEK IMMEDIATE MEDICAL CARE IF:   Your neck becomes stiff.  You drool or are unable to swallow liquids.  You vomit or are unable to keep medicines or liquids down.  You have severe pain that does not go away with the use of recommended medicines.  You have trouble breathing (not caused by a stuffy nose). MAKE SURE YOU:   Understand these instructions.  Will watch your condition.  Will get help right away if you are not doing well or get worse. Document Released: 12/06/2005 Document Revised: 09/26/2013 Document Reviewed: 08/13/2013 [facility] Patient Information 2014 [place].

## 2014-01-18 NOTE — Assessment & Plan Note (Signed)
Doing well has follow up with surgeon soon

## 2014-01-18 NOTE — Assessment & Plan Note (Addendum)
Having some radicular symptoms down left leg. Is worsening referred to neurosurgery for further considera.

## 2014-01-18 NOTE — Assessment & Plan Note (Signed)
Dr Chalmers Cater has recently increased Simvastatin 80 mg daily

## 2014-01-20 ENCOUNTER — Encounter: Payer: Self-pay | Admitting: Family Medicine

## 2014-01-20 DIAGNOSIS — J029 Acute pharyngitis, unspecified: Secondary | ICD-10-CM | POA: Insufficient documentation

## 2014-01-20 DIAGNOSIS — E669 Obesity, unspecified: Secondary | ICD-10-CM | POA: Insufficient documentation

## 2014-01-20 NOTE — Assessment & Plan Note (Signed)
TSH on 01/10/14 at Dr Almetta Lovely 2.21

## 2014-01-20 NOTE — Assessment & Plan Note (Signed)
Patient requesting Phentermine which has taken for a very short time in past. Encouraged DASH diet and regular exercise, given refill

## 2014-01-20 NOTE — Assessment & Plan Note (Signed)
hgba1c 6.5 on 01/10/14. No changes

## 2014-01-20 NOTE — Progress Notes (Signed)
Patient ID: Amanda Carlson, female   DOB: [birth year], 68 y.o.   MRN: [id number] Amanda Carlson [id number] Mar 07, 1946 01/20/2014      Progress Note-Follow Up  Subjective  Chief Complaint  Chief Complaint  Patient presents with  . Wound Check    medication refill    HPI  Patient is a 68 year old female who is in today for followup. She has not been in over a year. Is followed closely with endocrinology and has recently had her simvastatin increased. Does not note any myalgias. Has been struggling with a sore throat and chills this week. No significant cough or fevers. No chest pain or palpitations. No shortness of breath. She's recently been seen by an oral surgeon and found to have a precancerous lesion on her tongue which has been removed. She awaits a thyroid ultrasound next month. Denies dysphasia or other pain  Past Medical History  Diagnosis Date  . Knee pain, right 10/16/2011  . Knee pain, bilateral 10/16/2011  . HYPOTHYROIDISM 01/22/2011    Qualifier: Diagnosis of  By: Charlett Blake MD, Boyce Medici with Dr Chalmers Cater   . Overweight 01/22/2011    Qualifier: Diagnosis of  By: Charlett Blake MD, Erline Levine    . Intrinsic asthma, unspecified 01/22/2011    Qualifier: Diagnosis of  By: Charlett Blake MD, Erline Levine    . THYROID CYST 01/22/2011    Qualifier: Diagnosis of  By: Charlett Blake MD, Erline Levine    . ALLERGIC RHINITIS, SEASONAL 01/22/2011    Qualifier: Diagnosis of  By: Charlett Blake MD, Erline Levine    . GERD 01/22/2011    Qualifier: Diagnosis of  By: Charlett Blake MD, Erline Levine    . Irritable bowel syndrome 01/22/2011    Qualifier: Diagnosis of  By: Charlett Blake MD, Erline Levine    . CTS (carpal tunnel syndrome) 06/07/2012  . Tinea corporis 06/07/2012  . Insomnia 06/07/2012  . Low back pain 10/04/2012  . Diabetes mellitus without complication   . Sleep apnea   . Tongue lesion 01/18/2014  . Lesion of breast 01/18/2014    Calcified Right side? Follow[facility]    Past Surgical History  Procedure Laterality Date  . Knee arthroscopy      right  .  Cholecystectomy    . Tubal ligation    . Cervical fusion, discectomy, metal plate and 2 screws in place    . Thyroidectomy, partial      Family History  Problem Relation Age of Onset  . Atrial fibrillation Mother   . Other Mother     Back pain, CHF  . Arthritis Mother   . Osteoporosis Mother   . Heart disease Father   . Hypertension Father   . Diabetes Father   . Sleep apnea Father   . Atrial fibrillation Father     paroxysmal  . Diabetes Brother   . Diabetes Maternal Aunt     X 2 aunts  . Cancer Maternal Grandmother   . Diabetes Maternal Grandfather   . Stroke Paternal Grandmother   . Heart disease Paternal Grandfather   . Stroke Paternal Grandfather     History   Social History  . Marital Status: Divorced    Spouse Name: N/A    Number of Children: N/A  . Years of Education: N/A   Occupational History  . Not on file.   Social History Main Topics  . Smoking status: Never Smoker   . Smokeless tobacco: Never Used  . Alcohol Use: 0.0 oz/week    0 drink(s) per week  Comment: No intake of alcohol in greater than 10 yrs  . Drug Use: No  . Sexual Activity: Not on file   Other Topics Concern  . Not on file   Social History Narrative  . No narrative on file    Current Outpatient Prescriptions on File Prior to Visit  Medication Sig Dispense Refill  . albuterol (PROVENTIL HFA;VENTOLIN HFA) 108 (90 BASE) MCG/ACT inhaler Inhale 2 puffs into the lungs every 6 (six) hours as needed for wheezing.  1 Inhaler  1  . ALPRAZolam (XANAX) 0.25 MG tablet TAKE 1 TABLET BY MOUTH EVERY DAY AS NEEDED FOR SLEEP/ANXIETY  30 tablet  1  . aspirin 81 MG tablet Take 81 mg by mouth daily.      . baclofen (LIORESAL) 20 MG tablet Take 20 mg by mouth 2 (two) times daily as needed (muscle pain).      . cetirizine (ZYRTEC) 10 MG tablet Take 10 mg by mouth daily.       . Cholecalciferol (VITAMIN D) 1000 UNITS capsule Take 2,000 Units by mouth every other day.       . Fluticasone-Salmeterol  (ADVAIR) 250-50 MCG/DOSE AEPB Inhale 1 puff into the lungs every 12 (twelve) hours.      . furosemide (LASIX) 20 MG tablet Take 1 tablet (20 mg total) by mouth daily as needed for fluid.  30 tablet  1  . Krill Oil 300 MG CAPS Take 300 mg by mouth daily.      . metFORMIN (GLUCOPHAGE-XR) 500 MG 24 hr tablet Take 500 mg by mouth as directed. Take 1 tablet, then take 2 w/heavest meals      . naproxen (NAPROSYN) 375 MG tablet Take 375 mg by mouth 2 (two) times daily as needed (pain).      . sodium chloride (OCEAN) 0.65 % nasal spray Place 1 spray into the nose 2 (two) times daily as needed for congestion.      Marland Kitchen zolpidem (AMBIEN) 10 MG tablet TAKE 1 TABLET BY MOUTH AT BEDTIME AS NEEDED FOR SLEEP  30 tablet  1  . amoxicillin (AMOXIL) 500 MG tablet Take 2,000 mg by mouth once as needed. When go to the dentist       No current facility-administered medications on file prior to visit.    Allergies  Allergen Reactions  . Codeine     REACTION: causes itching  . Tetanus Toxoid     REACTION: NO Tetanus shot  . Adhesive [Tape] Rash    Review of Systems  Review of Systems  Constitutional: Positive for chills and malaise/fatigue. Negative for fever.  HENT: Positive for sore throat. Negative for congestion.   Eyes: Negative for discharge.  Respiratory: Negative for shortness of breath.   Cardiovascular: Negative for chest pain, palpitations and leg swelling.  Gastrointestinal: Negative for nausea, abdominal pain and diarrhea.  Genitourinary: Negative for dysuria.  Musculoskeletal: Negative for falls.  Skin: Negative for rash.  Neurological: Negative for loss of consciousness and headaches.  Endo/Heme/Allergies: Negative for polydipsia.  Psychiatric/Behavioral: Negative for depression and suicidal ideas. The patient is not nervous/anxious and does not have insomnia.     Objective  BP 122/72  Pulse 76  Temp(Src) 98.3 F (36.8 C) (Oral)  Ht 5' 6.25" (1.683 m)  Wt 192 lb 1.9 oz (87.145 kg)   BMI 30.77 kg/m2  SpO2 97%  Physical Exam  Physical Exam  Constitutional: She is oriented to person, place, and time and well-developed, well-nourished, and in no distress. No distress.  HENT:  Head: Normocephalic and atraumatic.  Eyes: Conjunctivae are normal.  Neck: Neck supple. No thyromegaly present.  Cardiovascular: Normal rate, regular rhythm and normal heart sounds.   No murmur heard. Pulmonary/Chest: Effort normal and breath sounds normal. She has no wheezes.  Abdominal: She exhibits no distension and no mass.  Musculoskeletal: She exhibits no edema.  Lymphadenopathy:    She has no cervical adenopathy.  Neurological: She is alert and oriented to person, place, and time.  Skin: Skin is warm and dry. No rash noted. She is not diaphoretic.  Psychiatric: Memory, affect and judgment normal.    No results found for this basename: TSH   Lab Results  Component Value Date   WBC 13.0* 09/19/2013   HGB 13.9 09/19/2013   HCT 41.9 09/19/2013   MCV 92.3 09/19/2013   PLT 258 09/19/2013   Lab Results  Component Value Date   CREATININE 0.66 09/19/2013   BUN 15 09/19/2013   NA 137 09/19/2013   K 4.2 09/19/2013   CL 99 09/19/2013   CO2 26 09/19/2013   No results found for this basename: ALT, AST, GGT, ALKPHOS, BILITOT   No results found for this basename: CHOL   No results found for this basename: HDL   No results found for this basename: LDLCALC   No results found for this basename: TRIG   No results found for this basename: CHOLHDL     Assessment & Plan  Tongue lesion Doing well has follow up with surgeon soon  THYROID CYST Dr Suzette Battiest of endocrine has monitored, is scheduled for biopsy of enlarging thyroid nodule next month. Levothyroxine dose is stable  HYPERLIPIDEMIA Dr Chalmers Cater has recently increased Simvastatin 80 mg daily   Low back pain Having some radicular symptoms down left leg. Is worsening referred to neurosurgery for further considera.  HYPOTHYROIDISM TSH on  01/10/14 at Dr Almetta Lovely 2.21  DM hgba1c 6.5 on 01/10/14. No changes  Obesity, unspecified Patient requesting Phentermine which has taken for a very short time in past. Encouraged DASH diet and regular exercise, given refill  Acute pharyngitis Given rx for Amoxicillin and encouraged probiotics.

## 2014-01-20 NOTE — Assessment & Plan Note (Signed)
Given rx for Amoxicillin and encouraged probiotics.

## 2014-01-21 ENCOUNTER — Telehealth: Payer: Self-pay

## 2014-01-21 NOTE — Telephone Encounter (Signed)
Relevant patient education mailed to patient.  

## 2014-01-29 ENCOUNTER — Other Ambulatory Visit (HOSPITAL_COMMUNITY)
Admission: RE | Admit: 2014-01-29 | Discharge: 2014-01-29 | Disposition: A | Discharge status: Home / Self Care | Payer: Medicare Other | Source: Ambulatory Visit | Attending: Diagnostic Radiology | Admitting: Diagnostic Radiology

## 2014-01-29 ENCOUNTER — Ambulatory Visit
Admission: RE | Admit: 2014-01-29 | Discharge: 2014-01-29 | Disposition: A | Discharge status: Home / Self Care | Payer: Medicare Other | Source: Ambulatory Visit | Attending: Endocrinology | Admitting: Endocrinology

## 2014-01-29 DIAGNOSIS — E041 Nontoxic single thyroid nodule: Secondary | ICD-10-CM

## 2014-01-29 DIAGNOSIS — E049 Nontoxic goiter, unspecified: Secondary | ICD-10-CM | POA: Insufficient documentation

## 2014-02-13 ENCOUNTER — Other Ambulatory Visit: Payer: Medicare Other

## 2014-02-18 ENCOUNTER — Ambulatory Visit: Payer: Medicare Other | Admitting: Family Medicine

## 2014-02-26 ENCOUNTER — Telehealth: Payer: Self-pay

## 2014-02-26 NOTE — Telephone Encounter (Signed)
Opened in error

## 2014-03-12 ENCOUNTER — Ambulatory Visit: Payer: Medicare Other | Admitting: Family Medicine

## 2014-03-19 ENCOUNTER — Telehealth: Payer: Self-pay | Admitting: Family Medicine

## 2014-03-19 MED ORDER — FLUTICASONE-SALMETEROL 250-50 MCG/DOSE IN AEPB: 1.0000 | INHALATION_SPRAY | Freq: Two times a day (BID) | RESPIRATORY_TRACT | Status: DC

## 2014-03-19 NOTE — Telephone Encounter (Signed)
Refill advair

## 2014-03-19 NOTE — Telephone Encounter (Signed)
Refill sent.

## 2014-03-20 ENCOUNTER — Other Ambulatory Visit: Payer: Self-pay

## 2014-03-20 DIAGNOSIS — M545 Low back pain, unspecified: Secondary | ICD-10-CM

## 2014-03-20 DIAGNOSIS — M5410 Radiculopathy, site unspecified: Secondary | ICD-10-CM

## 2014-03-20 NOTE — Telephone Encounter (Signed)
Please advise Tramadol refill?  Last RX was done on 01-18-14 quantity 60 with 1 refill  If ok fax to [phone]

## 2014-03-20 NOTE — Telephone Encounter (Signed)
OK to refill both with same sig, same strength, same number and one rf

## 2014-03-20 NOTE — Telephone Encounter (Signed)
Also needs a Zolpidem refill? Last RX done on 01-14-14 quantity 30 with 1 refill

## 2014-03-21 ENCOUNTER — Telehealth: Payer: Self-pay | Admitting: Family Medicine

## 2014-03-21 MED ORDER — TRAMADOL HCL 50 MG PO TABS: 50.0000 mg | ORAL_TABLET | Freq: Two times a day (BID) | ORAL | Status: DC | PRN

## 2014-03-21 MED ORDER — ZOLPIDEM TARTRATE 10 MG PO TABS: 10.0000 mg | ORAL_TABLET | Freq: Every evening | ORAL | Status: DC | PRN

## 2014-03-21 NOTE — Telephone Encounter (Signed)
RX faxed

## 2014-03-21 NOTE — Telephone Encounter (Signed)
Refill--tramadol  Refill- zolpidem

## 2014-03-21 NOTE — Telephone Encounter (Signed)
Check previous message. Already done

## 2014-03-27 ENCOUNTER — Other Ambulatory Visit: Payer: Self-pay | Admitting: Neurosurgery

## 2014-03-27 DIAGNOSIS — M4310 Spondylolisthesis, site unspecified: Secondary | ICD-10-CM

## 2014-04-09 ENCOUNTER — Ambulatory Visit
Admission: RE | Admit: 2014-04-09 | Discharge: 2014-04-09 | Disposition: A | Discharge status: Home / Self Care | Payer: Medicare Other | Source: Ambulatory Visit | Attending: Neurosurgery | Admitting: Neurosurgery

## 2014-04-09 DIAGNOSIS — M4310 Spondylolisthesis, site unspecified: Secondary | ICD-10-CM

## 2014-04-11 ENCOUNTER — Ambulatory Visit (INDEPENDENT_AMBULATORY_CARE_PROVIDER_SITE_OTHER): Payer: Medicare Other | Admitting: Family Medicine

## 2014-04-11 ENCOUNTER — Other Ambulatory Visit (HOSPITAL_COMMUNITY)
Admission: RE | Admit: 2014-04-11 | Discharge: 2014-04-11 | Disposition: A | Discharge status: Home / Self Care | Payer: Medicare Other | Source: Ambulatory Visit | Attending: Family Medicine | Admitting: Family Medicine

## 2014-04-11 ENCOUNTER — Encounter: Payer: Self-pay | Admitting: Family Medicine

## 2014-04-11 VITALS — BP 120/72 | HR 81 | Temp 98.3°F | Ht 66.25 in | Wt 185.1 lb

## 2014-04-11 DIAGNOSIS — Z23 Encounter for immunization: Secondary | ICD-10-CM

## 2014-04-11 DIAGNOSIS — Z0000 Encounter for general adult medical examination without abnormal findings: Secondary | ICD-10-CM

## 2014-04-11 DIAGNOSIS — K148 Other diseases of tongue: Secondary | ICD-10-CM

## 2014-04-11 DIAGNOSIS — K219 Gastro-esophageal reflux disease without esophagitis: Secondary | ICD-10-CM

## 2014-04-11 DIAGNOSIS — G4700 Insomnia, unspecified: Secondary | ICD-10-CM

## 2014-04-11 DIAGNOSIS — R350 Frequency of micturition: Secondary | ICD-10-CM

## 2014-04-11 DIAGNOSIS — Z124 Encounter for screening for malignant neoplasm of cervix: Secondary | ICD-10-CM | POA: Insufficient documentation

## 2014-04-11 DIAGNOSIS — M549 Dorsalgia, unspecified: Secondary | ICD-10-CM

## 2014-04-11 DIAGNOSIS — E669 Obesity, unspecified: Secondary | ICD-10-CM

## 2014-04-11 DIAGNOSIS — E039 Hypothyroidism, unspecified: Secondary | ICD-10-CM

## 2014-04-11 HISTORY — DX: Encounter for screening for malignant neoplasm of cervix: Z12.4

## 2014-04-11 LAB — POCT URINALYSIS DIPSTICK
BILIRUBIN UA: NEGATIVE
Blood, UA: NEGATIVE
GLUCOSE UA: NEGATIVE
Leukocytes, UA: NEGATIVE
Nitrite, UA: NEGATIVE
SPEC GRAV UA: 1.02
Urobilinogen, UA: 0.2
pH, UA: 5

## 2014-04-11 MED ORDER — PHENTERMINE HCL 37.5 MG PO CAPS: 37.5000 mg | ORAL_CAPSULE | ORAL | Status: DC

## 2014-04-11 MED ORDER — NITROFURANTOIN MONOHYD MACRO 100 MG PO CAPS: 100.0000 mg | ORAL_CAPSULE | Freq: Two times a day (BID) | ORAL | Status: DC

## 2014-04-11 MED ORDER — ZOLPIDEM TARTRATE 10 MG PO TABS: 10.0000 mg | ORAL_TABLET | Freq: Every evening | ORAL | Status: DC | PRN

## 2014-04-11 MED ORDER — CARISOPRODOL 350 MG PO TABS: 350.0000 mg | ORAL_TABLET | Freq: Two times a day (BID) | ORAL | Status: DC | PRN

## 2014-04-11 NOTE — Progress Notes (Signed)
Pre visit review using our clinic review tool, if applicable. No additional management support is needed unless otherwise documented below in the visit note. 

## 2014-04-11 NOTE — Patient Instructions (Addendum)
My annual labs usually include TSH, Lipids, CBC, Liver, Renal (CMP)   Insomnia Insomnia is frequent trouble falling and/or staying asleep. Insomnia can be a long term problem or a short term problem. Both are common. Insomnia can be a short term problem when the wakefulness is related to a certain stress or worry. Long term insomnia is often related to ongoing stress during waking hours and/or poor sleeping habits. Overtime, sleep deprivation itself can make the problem worse. Every little thing feels more severe because you are overtired and your ability to cope is decreased. CAUSES   Stress, anxiety, and depression.  Poor sleeping habits.  Distractions such as TV in the bedroom.  Naps close to bedtime.  Engaging in emotionally charged conversations before bed.  Technical reading before sleep.  Alcohol and other sedatives. They may make the problem worse. They can hurt normal sleep patterns and normal dream activity.  Stimulants such as caffeine for several hours prior to bedtime.  Pain syndromes and shortness of breath can cause insomnia.  Exercise late at night.  Changing time zones may cause sleeping problems (jet lag). It is sometimes helpful to have someone observe your sleeping patterns. They should look for periods of not breathing during the night (sleep apnea). They should also look to see how long those periods last. If you live alone or observers are uncertain, you can also be observed at a sleep clinic where your sleep patterns will be professionally monitored. Sleep apnea requires a checkup and treatment. Give your caregivers your medical history. Give your caregivers observations your family has made about your sleep.  SYMPTOMS   Not feeling rested in the morning.  Anxiety and restlessness at bedtime.  Difficulty falling and staying asleep. TREATMENT   Your caregiver may prescribe treatment for an underlying medical disorders. Your caregiver can give advice or  help if you are using alcohol or other drugs for self-medication. Treatment of underlying problems will usually eliminate insomnia problems.  Medications can be prescribed for short time use. They are generally not recommended for lengthy use.  Over-the-counter sleep medicines are not recommended for lengthy use. They can be habit forming.  You can promote easier sleeping by making lifestyle changes such as:  Using relaxation techniques that help with breathing and reduce muscle tension.  Exercising earlier in the day.  Changing your diet and the time of your last meal. No night time snacks.  Establish a regular time to go to bed.  Counseling can help with stressful problems and worry.  Soothing music and white noise may be helpful if there are background noises you cannot remove.  Stop tedious detailed work at least one hour before bedtime. HOME CARE INSTRUCTIONS   Keep a diary. Inform your caregiver about your progress. This includes any medication side effects. See your caregiver regularly. Take note of:  Times when you are asleep.  Times when you are awake during the night.  The quality of your sleep.  How you feel the next day. This information will help your caregiver care for you.  Get out of bed if you are still awake after 15 minutes. Read or do some quiet activity. Keep the lights down. Wait until you feel sleepy and go back to bed.  Keep regular sleeping and waking hours. Avoid naps.  Exercise regularly.  Avoid distractions at bedtime. Distractions include watching television or engaging in any intense or detailed activity like attempting to balance the household checkbook.  Develop a bedtime ritual. Keep a  familiar routine of bathing, brushing your teeth, climbing into bed at the same time each night, listening to soothing music. Routines increase the success of falling to sleep faster.  Use relaxation techniques. This can be using breathing and muscle tension  release routines. It can also include visualizing peaceful scenes. You can also help control troubling or intruding thoughts by keeping your mind occupied with boring or repetitive thoughts like the old concept of counting sheep. You can make it more creative like imagining planting one beautiful flower after another in your backyard garden.  During your day, work to eliminate stress. When this is not possible use some of the previous suggestions to help reduce the anxiety that accompanies stressful situations. MAKE SURE YOU:   Understand these instructions.  Will watch your condition.  Will get help right away if you are not doing well or get worse. Document Released: 12/03/2000 Document Revised: 02/28/2012 Document Reviewed: 01/03/2008 [facility] Patient Information 2014 [place].

## 2014-04-11 NOTE — Assessment & Plan Note (Signed)
Pap today, no concerns on exam.  

## 2014-04-13 LAB — CULTURE, URINE COMPREHENSIVE
COLONY COUNT: NO GROWTH
ORGANISM ID, BACTERIA: NO GROWTH

## 2014-04-14 ENCOUNTER — Encounter: Payer: Self-pay | Admitting: Family Medicine

## 2014-04-14 DIAGNOSIS — Z0000 Encounter for general adult medical examination without abnormal findings: Secondary | ICD-10-CM

## 2014-04-14 DIAGNOSIS — R350 Frequency of micturition: Secondary | ICD-10-CM | POA: Insufficient documentation

## 2014-04-14 HISTORY — DX: Encounter for general adult medical examination without abnormal findings: Z00.00

## 2014-04-14 NOTE — Assessment & Plan Note (Signed)
Avoid offending foods, start probiotics. Do not eat large meals in late evening and consider raising head of bed.  

## 2014-04-14 NOTE — Progress Notes (Signed)
Patient ID: Amanda Carlson, female   DOB: 1946-12-19, 68 y.o.   MRN: [id number] Amanda Carlson [id number] 25-Mar-1946 04/14/2014      Progress Note-Follow Up  Subjective  Chief Complaint  Chief Complaint  Patient presents with  . Gynecologic Exam    pap and breast exam  . Injections    prevnar    HPI  Patient is a 68 year old female in today for routine medical care. She is complaining of some urinary frequency, urgency and dysuria which is intermittent. No significant abdominal or back pain. No vaginal discharge or concerns. He continues to have a burning sensation where the precancerous lesion was taken off her tongue but it is tolerable. No other acute complaints or recent illness. She is doing well at home. Has had no trouble with depression or her ADLs.sbh .  Past Medical History  Diagnosis Date  . Knee pain, right 10/16/2011  . Knee pain, bilateral 10/16/2011  . HYPOTHYROIDISM 01/22/2011    Qualifier: Diagnosis of  By: Charlett Blake MD, Boyce Medici with Dr Chalmers Cater   . Overweight 01/22/2011    Qualifier: Diagnosis of  By: Charlett Blake MD, Erline Levine    . Intrinsic asthma, unspecified 01/22/2011    Qualifier: Diagnosis of  By: Charlett Blake MD, Erline Levine    . THYROID CYST 01/22/2011    Qualifier: Diagnosis of  By: Charlett Blake MD, Erline Levine    . ALLERGIC RHINITIS, SEASONAL 01/22/2011    Qualifier: Diagnosis of  By: Charlett Blake MD, Erline Levine    . GERD 01/22/2011    Qualifier: Diagnosis of  By: Charlett Blake MD, Erline Levine    . Irritable bowel syndrome 01/22/2011    Qualifier: Diagnosis of  By: Charlett Blake MD, Erline Levine    . CTS (carpal tunnel syndrome) 06/07/2012  . Tinea corporis 06/07/2012  . Insomnia 06/07/2012  . Low back pain 10/04/2012  . Diabetes mellitus without complication   . Sleep apnea   . Tongue lesion 01/18/2014  . Lesion of breast 01/18/2014    Calcified Right side? Follows at [place]  . Cervical cancer screening 04/11/2014  . Medicare annual wellness visit, subsequent 04/14/2014    Past Surgical History  Procedure Laterality Date   . Knee arthroscopy      right  . Cholecystectomy    . Tubal ligation    . Cervical fusion, discectomy, metal plate and 2 screws in place    . Thyroidectomy, partial      Family History  Problem Relation Age of Onset  . Atrial fibrillation Mother   . Other Mother     Back pain, CHF  . Arthritis Mother   . Osteoporosis Mother   . Heart disease Father   . Hypertension Father   . Diabetes Father   . Sleep apnea Father   . Atrial fibrillation Father     paroxysmal  . Cancer Father     skin cancers on face  . Diabetes Brother   . Diabetes Maternal Aunt     X 2 aunts  . Cancer Maternal Grandmother   . Diabetes Maternal Grandfather   . Stroke Paternal Grandmother   . Heart disease Paternal Grandfather   . Stroke Paternal Grandfather     History   Social History  . Marital Status: Divorced    Spouse Name: N/A    Number of Children: N/A  . Years of Education: N/A   Occupational History  . Not on file.   Social History Main Topics  . Smoking status: Never Smoker   .  Smokeless tobacco: Never Used  . Alcohol Use: 0.0 oz/week    0 drink(s) per week     Comment: No intake of alcohol in greater than 10 yrs  . Drug Use: No  . Sexual Activity: Not on file   Other Topics Concern  . Not on file   Social History Narrative  . No narrative on file    Current Outpatient Prescriptions on File Prior to Visit  Medication Sig Dispense Refill  . albuterol (PROVENTIL HFA;VENTOLIN HFA) 108 (90 BASE) MCG/ACT inhaler Inhale 2 puffs into the lungs every 6 (six) hours as needed for wheezing.  1 Inhaler  1  . ALPRAZolam (XANAX) 0.25 MG tablet TAKE 1 TABLET BY MOUTH EVERY DAY AS NEEDED FOR SLEEP/ANXIETY  30 tablet  1  . aspirin 81 MG tablet Take 81 mg by mouth daily.      . cetirizine (ZYRTEC) 10 MG tablet Take 10 mg by mouth daily.       . Cholecalciferol (VITAMIN D) 1000 UNITS capsule Take 2,000 Units by mouth every other day.       . Fluticasone-Salmeterol (ADVAIR) 250-50 MCG/DOSE  AEPB Inhale 1 puff into the lungs every 12 (twelve) hours.  60 each  3  . furosemide (LASIX) 20 MG tablet Take 1 tablet (20 mg total) by mouth daily as needed for fluid.  30 tablet  1  . Krill Oil 300 MG CAPS Take 300 mg by mouth daily.      Marland Kitchen levothyroxine (SYNTHROID, LEVOTHROID) 100 MCG tablet Take 100 mcg by mouth daily before breakfast.      . metFORMIN (GLUCOPHAGE-XR) 500 MG 24 hr tablet Take 500 mg by mouth as directed. Take 1 tablet, then take 2 w/heavest meals      . naproxen (NAPROSYN) 375 MG tablet Take 375 mg by mouth 2 (two) times daily as needed (pain).      . simvastatin (ZOCOR) 80 MG tablet Take 80 mg by mouth daily.      . sodium chloride (OCEAN) 0.65 % nasal spray Place 1 spray into the nose 2 (two) times daily as needed for congestion.      . traMADol (ULTRAM) 50 MG tablet Take 1 tablet (50 mg total) by mouth 2 (two) times daily as needed.  60 tablet  1  . amoxicillin (AMOXIL) 500 MG tablet Take 2,000 mg by mouth once as needed. When go to the dentist       No current facility-administered medications on file prior to visit.    Allergies  Allergen Reactions  . Codeine     REACTION: causes itching  . Tetanus Toxoid     REACTION: NO Tetanus shot  . Adhesive [Tape] Rash    Review of Systems  Review of Systems  Constitutional: Negative for fever, chills and malaise/fatigue.  HENT: Negative for congestion, hearing loss and nosebleeds.   Eyes: Negative for discharge.  Respiratory: Negative for cough, sputum production, shortness of breath and wheezing.   Cardiovascular: Negative for chest pain, palpitations and leg swelling.  Gastrointestinal: Negative for heartburn, nausea, vomiting, abdominal pain, diarrhea, constipation and blood in stool.  Genitourinary: Positive for dysuria, urgency and frequency. Negative for hematuria.  Musculoskeletal: Negative for back pain, falls and myalgias.  Skin: Negative for rash.  Neurological: Negative for dizziness, tremors, sensory  change, focal weakness, loss of consciousness, weakness and headaches.  Endo/Heme/Allergies: Negative for polydipsia. Does not bruise/bleed easily.  Psychiatric/Behavioral: Negative for depression and suicidal ideas. The patient is not nervous/anxious and does  not have insomnia.     Objective  BP 120/72  Pulse 81  Temp(Src) 98.3 F (36.8 C) (Oral)  Ht 5' 6.25" (1.683 m)  Wt 185 lb 1.9 oz (83.97 kg)  BMI 29.65 kg/m2  SpO2 97%  Physical Exam  Physical Exam  Constitutional: She is oriented to person, place, and time and well-developed, well-nourished, and in no distress. No distress.  HENT:  Head: Normocephalic and atraumatic.  Right Ear: External ear normal.  Left Ear: External ear normal.  Nose: Nose normal.  Mouth/Throat: Oropharynx is clear and moist. No oropharyngeal exudate.  Eyes: Conjunctivae are normal. Pupils are equal, round, and reactive to light. Right eye exhibits no discharge. Left eye exhibits no discharge. No scleral icterus.  Neck: Normal range of motion. Neck supple. No thyromegaly present.  Cardiovascular: Normal rate, regular rhythm, normal heart sounds and intact distal pulses.   No murmur heard. Pulmonary/Chest: Effort normal and breath sounds normal. No respiratory distress. She has no wheezes. She has no rales.  Abdominal: Soft. Bowel sounds are normal. She exhibits no distension and no mass. There is no tenderness.  Genitourinary: Vagina normal, uterus normal, cervix normal, right adnexa normal and left adnexa normal. No vaginal discharge found.  Musculoskeletal: Normal range of motion. She exhibits no edema and no tenderness.  Lymphadenopathy:    She has no cervical adenopathy.  Neurological: She is alert and oriented to person, place, and time. She has normal reflexes. No cranial nerve deficit. Coordination normal.  Skin: Skin is warm and dry. No rash noted. She is not diaphoretic.  Psychiatric: Mood, memory and affect normal.    No results found for  this basename: TSH   Lab Results  Component Value Date   WBC 13.0* 09/19/2013   HGB 13.9 09/19/2013   HCT 41.9 09/19/2013   MCV 92.3 09/19/2013   PLT 258 09/19/2013   Lab Results  Component Value Date   CREATININE 0.66 09/19/2013   BUN 15 09/19/2013   NA 137 09/19/2013   K 4.2 09/19/2013   CL 99 09/19/2013   CO2 26 09/19/2013   No results found for this basename: ALT, AST, GGT, ALKPHOS, BILITOT   No results found for this basename: CHOL   No results found for this basename: HDL   No results found for this basename: LDLCALC   No results found for this basename: TRIG   No results found for this basename: CHOLHDL     Assessment & Plan  Cervical cancer screening Pap today, no concerns on exam.   HYPOTHYROIDISM Follows with endocrinology  Obesity, unspecified Encouraged DASH diet, decrease po intake and increase exercise as tolerated. Needs 7-8 hours of sleep nightly. Avoid trans fats, eat small, frequent meals every 4-5 hours with lean proteins, complex carbs and healthy fats. Minimize simple carbs, GMO foods.  GERD Avoid offending foods, start probiotics. Do not eat large meals in late evening and consider raising head of bed.   Urine frequency Urine culture negative, enocuraged Kegel exercises  Tongue lesion Continues to struggle with some burning at the site where the lesion was removed  Medicare annual wellness visit, subsequent Patient denies any difficulties at home. No trouble with ADLs, depression or falls. No recent changes to vision or hearing. Is UTD with immunizations. Is UTD with screening. Discussed Advanced Directives, patient agrees to bring [place] copies of documents if can. Encouraged heart healthy diet, exercise as tolerated and adequate sleep

## 2014-04-14 NOTE — Assessment & Plan Note (Signed)
Encouraged DASH diet, decrease po intake and increase exercise as tolerated. Needs 7-8 hours of sleep nightly. Avoid trans fats, eat small, frequent meals every 4-5 hours with lean proteins, complex carbs and healthy fats. Minimize simple carbs, GMO foods. 

## 2014-04-14 NOTE — Assessment & Plan Note (Signed)
Urine culture negative, enocuraged Kegel exercises

## 2014-04-14 NOTE — Assessment & Plan Note (Signed)
Patient denies any difficulties at home. No trouble with ADLs, depression or falls. No recent changes to vision or hearing. Is UTD with immunizations. Is UTD with screening. Discussed Advanced Directives, patient agrees to bring [place] copies of documents if can. Encouraged heart healthy diet, exercise as tolerated and adequate sleep

## 2014-04-14 NOTE — Assessment & Plan Note (Signed)
Follows with endocrinology ?

## 2014-04-14 NOTE — Assessment & Plan Note (Signed)
Continues to struggle with some burning at the site where the lesion was removed

## 2014-04-18 ENCOUNTER — Telehealth: Payer: Self-pay | Admitting: Family Medicine

## 2014-04-18 NOTE — Telephone Encounter (Signed)
PA form received for Zolpidem tartrate, forward to nurse

## 2014-04-19 NOTE — Telephone Encounter (Signed)
Per md: we need to know what else patient has tried besides Zolpidem?  In the chart it shows pt has been taking Ambien since 2012 And alprazolam from 2012-2015

## 2014-04-22 ENCOUNTER — Encounter: Payer: Self-pay | Admitting: Family Medicine

## 2014-05-14 ENCOUNTER — Telehealth: Payer: Self-pay | Admitting: Family Medicine

## 2014-05-14 DIAGNOSIS — M545 Low back pain, unspecified: Secondary | ICD-10-CM

## 2014-05-14 DIAGNOSIS — M5410 Radiculopathy, site unspecified: Secondary | ICD-10-CM

## 2014-05-14 NOTE — Telephone Encounter (Signed)
Denied RX. Last RX was done on 03-21-14 quantity 60 with 1 refill. Shouldn't be filled again to 05-21-14

## 2014-05-14 NOTE — Telephone Encounter (Signed)
Refill- tramadol  [id number] in [place]

## 2014-05-17 ENCOUNTER — Other Ambulatory Visit: Payer: Self-pay

## 2014-05-17 DIAGNOSIS — G4700 Insomnia, unspecified: Secondary | ICD-10-CM

## 2014-05-17 NOTE — Telephone Encounter (Signed)
Pharmacy is faxing over PA again, forward to nurse

## 2014-05-20 ENCOUNTER — Other Ambulatory Visit: Payer: Self-pay

## 2014-05-20 DIAGNOSIS — M5410 Radiculopathy, site unspecified: Secondary | ICD-10-CM

## 2014-05-20 DIAGNOSIS — M545 Low back pain, unspecified: Secondary | ICD-10-CM

## 2014-05-20 MED ORDER — TRAMADOL HCL 50 MG PO TABS: 50.0000 mg | ORAL_TABLET | Freq: Two times a day (BID) | ORAL | Status: DC | PRN

## 2014-05-20 NOTE — Telephone Encounter (Signed)
RX faxed

## 2014-05-21 NOTE — Telephone Encounter (Signed)
Pharmacy is faxed over PA again, forward to nurse

## 2014-05-23 NOTE — Telephone Encounter (Signed)
PA has been approved from 05-02-14 to 05-23-15

## 2014-06-17 LAB — HM MAMMOGRAPHY

## 2014-06-18 ENCOUNTER — Encounter: Payer: Self-pay | Admitting: Family Medicine

## 2014-06-27 ENCOUNTER — Encounter: Payer: Self-pay | Admitting: Family Medicine

## 2014-07-02 ENCOUNTER — Telehealth: Payer: Self-pay

## 2014-07-02 DIAGNOSIS — E785 Hyperlipidemia, unspecified: Secondary | ICD-10-CM

## 2014-07-02 DIAGNOSIS — E119 Type 2 diabetes mellitus without complications: Secondary | ICD-10-CM

## 2014-07-02 NOTE — Telephone Encounter (Signed)
Orders faxed to [phone] at [facility]

## 2014-07-02 NOTE — Telephone Encounter (Signed)
We need his fax number

## 2014-07-02 NOTE — Telephone Encounter (Signed)
Patient called again stating that she will be seeing Dr. Chalmers Cater in August and wants to know if we could send labs orders to him?

## 2014-07-02 NOTE — Telephone Encounter (Signed)
Diabetic Bundle- Left a detailed message on patients vm asking that she calls [place] back.  we need her to come in at his earliest convenience for labs to be drawn for LDL and A1C.   Lab order placed

## 2014-07-02 NOTE — Telephone Encounter (Signed)
Patient returned phone call. I informed her that she needed to come in for labs. No appointment needed.

## 2014-07-19 ENCOUNTER — Other Ambulatory Visit: Payer: Self-pay | Admitting: Family Medicine

## 2014-07-19 NOTE — Telephone Encounter (Signed)
Pt has upcoming appt in 09/2014. Rx printed and forwarded to Provider for signature.  Medication name:  Name from pharmacy:  ALPRAZolam (XANAX) 0.25 MG tablet  ALPRAZOLAM 0.25 MG TABLET Sig: TAKE 1 TABLET BY MOUTH DAILY AS NEEDED FOR SLEEP/ANXIETY Dispense: 30 tablet Start: 07/19/2014 Class: Normal Notes to pharmacy: This request is for a new prescription for a controlled substance as required by Federal/State law.. Requested on: 01/15/2014 Originally ordered on: 09/18/2013 Last refill: 04/15/2014

## 2014-07-19 NOTE — Telephone Encounter (Signed)
Rx faxed to pharmacy  

## 2014-07-22 ENCOUNTER — Other Ambulatory Visit: Payer: Self-pay

## 2014-07-22 DIAGNOSIS — M5410 Radiculopathy, site unspecified: Secondary | ICD-10-CM

## 2014-07-22 MED ORDER — TRAMADOL HCL 50 MG PO TABS: 50.0000 mg | ORAL_TABLET | Freq: Two times a day (BID) | ORAL | Status: DC | PRN

## 2014-07-22 NOTE — Telephone Encounter (Signed)
Verbal per md ok to refill (md wrote on paper request)

## 2014-09-16 ENCOUNTER — Other Ambulatory Visit: Payer: Self-pay

## 2014-09-16 DIAGNOSIS — M5410 Radiculopathy, site unspecified: Secondary | ICD-10-CM

## 2014-09-16 MED ORDER — TRAMADOL HCL 50 MG PO TABS: 50.0000 mg | ORAL_TABLET | Freq: Two times a day (BID) | ORAL | Status: DC | PRN

## 2014-09-16 NOTE — Telephone Encounter (Signed)
Last RX wrote on 07-22-14 with 1 refill  Will ok RX with dispensing date of 09-21-14

## 2014-09-20 ENCOUNTER — Other Ambulatory Visit: Payer: Self-pay

## 2014-09-20 MED ORDER — ALPRAZOLAM 0.25 MG PO TABS: 0.2500 mg | ORAL_TABLET | Freq: Every day | ORAL | Status: DC | PRN

## 2014-09-20 NOTE — Telephone Encounter (Signed)
Last rx was done on 07-19-14 quantity 30 with 1 refill  rx printed for md to sign and fax

## 2014-10-14 ENCOUNTER — Ambulatory Visit: Payer: Medicare Other | Admitting: Family Medicine

## 2014-10-15 ENCOUNTER — Other Ambulatory Visit: Payer: Self-pay

## 2014-10-15 MED ORDER — FUROSEMIDE 20 MG PO TABS: 20.0000 mg | ORAL_TABLET | Freq: Every day | ORAL | Status: DC | PRN

## 2014-10-23 ENCOUNTER — Other Ambulatory Visit: Payer: Self-pay

## 2014-10-23 DIAGNOSIS — G4700 Insomnia, unspecified: Secondary | ICD-10-CM

## 2014-10-23 MED ORDER — ZOLPIDEM TARTRATE 10 MG PO TABS: 10.0000 mg | ORAL_TABLET | Freq: Every evening | ORAL | Status: DC | PRN

## 2014-11-06 ENCOUNTER — Telehealth: Payer: Self-pay | Admitting: Family Medicine

## 2014-11-06 NOTE — Telephone Encounter (Signed)
Caller name: Marlaya Relation to pt: self Call back number: [phone] Pharmacy:  Reason for call:   Patient states that she went to Dr. Vertell Limber and he recommends that patient have PT. Patient wants to know who Dr. Charlett Blake recommends she go to

## 2014-11-07 NOTE — Telephone Encounter (Signed)
She could try PT here at [facility] let me know an I will orer

## 2014-11-08 NOTE — Telephone Encounter (Signed)
Amanda Carlson returned call from [place].

## 2014-11-08 NOTE — Telephone Encounter (Signed)
Left another message for patient to return my call and to let us know if we can leave a message on her vm

## 2014-11-08 NOTE — Telephone Encounter (Signed)
Pt states she has an appt at 2 on Tues so she will discuss then

## 2014-11-08 NOTE — Telephone Encounter (Signed)
Left a message for pt to return our call.

## 2014-11-12 ENCOUNTER — Encounter: Payer: Self-pay | Admitting: Family Medicine

## 2014-11-12 ENCOUNTER — Ambulatory Visit (INDEPENDENT_AMBULATORY_CARE_PROVIDER_SITE_OTHER): Payer: Medicare Other

## 2014-11-12 ENCOUNTER — Ambulatory Visit (INDEPENDENT_AMBULATORY_CARE_PROVIDER_SITE_OTHER): Payer: Medicare Other | Admitting: Family Medicine

## 2014-11-12 VITALS — BP 117/60 | HR 67 | Temp 98.1°F | Ht 66.25 in | Wt 190.2 lb

## 2014-11-12 DIAGNOSIS — E669 Obesity, unspecified: Secondary | ICD-10-CM

## 2014-11-12 DIAGNOSIS — E039 Hypothyroidism, unspecified: Secondary | ICD-10-CM

## 2014-11-12 DIAGNOSIS — K219 Gastro-esophageal reflux disease without esophagitis: Secondary | ICD-10-CM

## 2014-11-12 DIAGNOSIS — Z23 Encounter for immunization: Secondary | ICD-10-CM

## 2014-11-12 DIAGNOSIS — I10 Essential (primary) hypertension: Secondary | ICD-10-CM

## 2014-11-12 DIAGNOSIS — M5410 Radiculopathy, site unspecified: Secondary | ICD-10-CM

## 2014-11-12 DIAGNOSIS — G4700 Insomnia, unspecified: Secondary | ICD-10-CM

## 2014-11-12 MED ORDER — ALPRAZOLAM 0.25 MG PO TABS: 0.2500 mg | ORAL_TABLET | Freq: Every day | ORAL | Status: DC | PRN

## 2014-11-12 MED ORDER — TRAMADOL HCL 50 MG PO TABS: 50.0000 mg | ORAL_TABLET | Freq: Four times a day (QID) | ORAL | Status: DC | PRN

## 2014-11-12 MED ORDER — ZOLPIDEM TARTRATE 10 MG PO TABS: 10.0000 mg | ORAL_TABLET | Freq: Every evening | ORAL | Status: DC | PRN

## 2014-11-12 MED ORDER — BACLOFEN 20 MG PO TABS: 20.0000 mg | ORAL_TABLET | Freq: Four times a day (QID) | ORAL | Status: DC | PRN

## 2014-11-12 MED ORDER — FLUTICASONE PROPIONATE 50 MCG/ACT NA SUSP: 2.0000 | Freq: Every day | NASAL | Status: DC | PRN

## 2014-11-12 MED ORDER — ZOSTER VACCINE LIVE 19400 UNT/0.65ML ~~LOC~~ SOLR: 0.6500 mL | Freq: Once | SUBCUTANEOUS | Status: DC

## 2014-11-12 NOTE — Patient Instructions (Addendum)
[facility] physical therapy or PT here in the building at [place] gel or patch   [employer] and see if they will pay for zostavax/shingles shot  New weight loss drugs Belviq and Qysmia   Back Pain, Adult Low back pain is very common. About 1 in 5 people have back pain.The cause of low back pain is rarely dangerous. The pain often gets better over time.About half of people with a sudden onset of back pain feel better in just 2 weeks. About 8 in 10 people feel better by 6 weeks.  CAUSES Some common causes of back pain include:  Strain of the muscles or ligaments supporting the spine.  Wear and tear (degeneration) of the spinal discs.  Arthritis.  Direct injury to the back. DIAGNOSIS Most of the time, the direct cause of low back pain is not known.However, back pain can be treated effectively even when the exact cause of the pain is unknown.Answering your caregiver's questions about your overall health and symptoms is one of the most accurate ways to make sure the cause of your pain is not dangerous. If your caregiver needs more information, he or she may order lab work or imaging tests (X-rays or MRIs).However, even if imaging tests show changes in your back, this usually does not require surgery. HOME CARE INSTRUCTIONS For many people, back pain returns.Since low back pain is rarely dangerous, it is often a condition that people can learn to [facility] their own.   Remain active. It is stressful on the back to sit or stand in one place. Do not sit, drive, or stand in one place for more than 30 minutes at a time. Take short walks on level surfaces as soon as pain allows.Try to increase the length of time you walk each day.  Do not stay in bed.Resting more than 1 or 2 days can delay your recovery.  Do not avoid exercise or work.Your body is made to move.It is not dangerous to be active, even though your back may hurt.Your back will likely heal faster if you return to  being active before your pain is gone.  Pay attention to your body when you bend and lift. Many people have less discomfortwhen lifting if they bend their knees, keep the load close to their bodies,and avoid twisting. Often, the most comfortable positions are those that put less stress on your recovering back.  Find a comfortable position to sleep. Use a firm mattress and lie on your side with your knees slightly bent. If you lie on your back, put a pillow under your knees.  Only take over-the-counter or prescription medicines as directed by your caregiver. Over-the-counter medicines to reduce pain and inflammation are often the most helpful.Your caregiver may prescribe muscle relaxant drugs.These medicines help dull your pain so you can more quickly return to your normal activities and healthy exercise.  Put ice on the injured area.  Put ice in a plastic bag.  Place a towel between your skin and the bag.  Leave the ice on for 15-20 minutes, 03-04 times a day for the first 2 to 3 days. After that, ice and heat may be alternated to reduce pain and spasms.  Ask your caregiver about trying back exercises and gentle massage. This may be of some benefit.  Avoid feeling anxious or stressed.Stress increases muscle tension and can worsen back pain.It is important to recognize when you are anxious or stressed and learn ways to manage it.Exercise is a great  option. SEEK MEDICAL CARE IF:  You have pain that is not relieved with rest or medicine.  You have pain that does not improve in 1 week.  You have new symptoms.  You are generally not feeling well. SEEK IMMEDIATE MEDICAL CARE IF:   You have pain that radiates from your back into your legs.  You develop new bowel or bladder control problems.  You have unusual weakness or numbness in your arms or legs.  You develop nausea or vomiting.  You develop abdominal pain.  You feel faint. Document Released: 12/06/2005 Document Revised:  06/06/2012 Document Reviewed: 04/09/2014 [facility] Patient Information 2015 [place], [place]. This information is not intended to replace advice given to you by your health care provider. Make sure you discuss any questions you have with your health care provider.   Salon Pas patch or gel to affected

## 2014-11-12 NOTE — Progress Notes (Signed)
Pre visit review using our clinic review tool, if applicable. No additional management support is needed unless otherwise documented below in the visit note. 

## 2014-11-18 ENCOUNTER — Encounter: Payer: Self-pay | Admitting: Family Medicine

## 2014-11-18 DIAGNOSIS — M5410 Radiculopathy, site unspecified: Secondary | ICD-10-CM | POA: Insufficient documentation

## 2014-11-18 NOTE — Progress Notes (Signed)
Amanda Carlson  [id number] [birth year] 11/18/2014      Progress Note-Follow Up  Subjective  Chief Complaint  Chief Complaint  Patient presents with  . Follow-up    6 month  . Injections    flu    HPI  Patient is a 68 y.o. female in today for routine medical care. Patient is in today to discuss multiple concerns. No acute illness. Denies CP/palp/SOB/HA/congestion/fevers/GI or GU c/o. Taking meds as prescribed  Past Medical History  Diagnosis Date  . Knee pain, right 10/16/2011  . Knee pain, bilateral 10/16/2011  . HYPOTHYROIDISM 01/22/2011    Qualifier: Diagnosis of  By: Charlett Blake MD, Boyce Medici with Dr Chalmers Cater   . Overweight(278.02) 01/22/2011    Qualifier: Diagnosis of  By: Charlett Blake MD, Erline Levine    . Intrinsic asthma, unspecified 01/22/2011    Qualifier: Diagnosis of  By: Charlett Blake MD, Erline Levine    . THYROID CYST 01/22/2011    Qualifier: Diagnosis of  By: Charlett Blake MD, Erline Levine    . ALLERGIC RHINITIS, SEASONAL 01/22/2011    Qualifier: Diagnosis of  By: Charlett Blake MD, Erline Levine    . GERD 01/22/2011    Qualifier: Diagnosis of  By: Charlett Blake MD, Erline Levine    . Irritable bowel syndrome 01/22/2011    Qualifier: Diagnosis of  By: Charlett Blake MD, Erline Levine    . CTS (carpal tunnel syndrome) 06/07/2012  . Tinea corporis 06/07/2012  . Insomnia 06/07/2012  . Low back pain 10/04/2012  . Diabetes mellitus without complication   . Sleep apnea   . Tongue lesion 01/18/2014  . Lesion of breast 01/18/2014    Calcified Right side? Follows at [facility]  . Cervical cancer screening 04/11/2014  . Medicare annual wellness visit, subsequent 04/14/2014    Past Surgical History  Procedure Laterality Date  . Knee arthroscopy      right  . Cholecystectomy    . Tubal ligation    . Cervical fusion, discectomy, metal plate and 2 screws in place    . Thyroidectomy, partial      Family History  Problem Relation Age of Onset  . Atrial fibrillation Mother   . Other Mother     Back pain, CHF  . Arthritis Mother   . Osteoporosis Mother   . Heart  disease Father   . Hypertension Father   . Diabetes Father   . Sleep apnea Father   . Atrial fibrillation Father     paroxysmal  . Cancer Father     skin cancers on face  . Diabetes Brother   . Diabetes Maternal Aunt     X 2 aunts  . Cancer Maternal Grandmother   . Diabetes Maternal Grandfather   . Stroke Paternal Grandmother   . Heart disease Paternal Grandfather   . Stroke Paternal Grandfather     History   Social History  . Marital Status: Divorced    Spouse Name: N/A    Number of Children: N/A  . Years of Education: N/A   Occupational History  . Not on file.   Social History Main Topics  . Smoking status: Never Smoker   . Smokeless tobacco: Never Used  . Alcohol Use: 0.0 oz/week    0 drink(s) per week     Comment: No intake of alcohol in greater than 10 yrs  . Drug Use: No  . Sexual Activity: Not on file   Other Topics Concern  . Not on file   Social History Narrative    Current Outpatient Prescriptions on  File Prior to Visit  Medication Sig Dispense Refill  . albuterol (PROVENTIL HFA;VENTOLIN HFA) 108 (90 BASE) MCG/ACT inhaler Inhale 2 puffs into the lungs every 6 (six) hours as needed for wheezing. 1 Inhaler 1  . aspirin 81 MG tablet Take 81 mg by mouth daily.    . cetirizine (ZYRTEC) 10 MG tablet Take 10 mg by mouth daily as needed.     . Cholecalciferol (VITAMIN D) 1000 UNITS capsule Take 2,000 Units by mouth daily.     . Fluticasone-Salmeterol (ADVAIR) 250-50 MCG/DOSE AEPB Inhale 1 puff into the lungs every 12 (twelve) hours. 60 each 3  . furosemide (LASIX) 20 MG tablet Take 1 tablet (20 mg total) by mouth daily as needed for fluid. 30 tablet 1  . Krill Oil 300 MG CAPS Take 300 mg by mouth daily.    Marland Kitchen levothyroxine (SYNTHROID, LEVOTHROID) 100 MCG tablet Take 100 mcg by mouth daily before breakfast.    . metFORMIN (GLUCOPHAGE-XR) 500 MG 24 hr tablet Take 500 mg by mouth as directed. Take 1 tablet, then take 2 w/heavest meals    . naproxen (NAPROSYN) 375  MG tablet Take 375 mg by mouth 2 (two) times daily as needed (pain).    . simvastatin (ZOCOR) 80 MG tablet Take 80 mg by mouth daily.    . sodium chloride (OCEAN) 0.65 % nasal spray Place 1 spray into the nose 2 (two) times daily as needed for congestion.    Marland Kitchen amoxicillin (AMOXIL) 500 MG tablet Take 2,000 mg by mouth once as needed. When go to the dentist     No current facility-administered medications on file prior to visit.    Allergies  Allergen Reactions  . Codeine     REACTION: causes itching  . Tetanus Toxoid     REACTION: NO Tetanus shot  . Adhesive [Tape] Rash    Review of Systems  Review of Systems  Constitutional: Negative for fever and malaise/fatigue.  HENT: Negative for congestion.   Eyes: Negative for discharge.  Respiratory: Negative for shortness of breath.   Cardiovascular: Negative for chest pain, palpitations and leg swelling.  Gastrointestinal: Negative for nausea, abdominal pain and diarrhea.  Genitourinary: Negative for dysuria.  Musculoskeletal: Negative for falls.  Skin: Negative for rash.  Neurological: Negative for loss of consciousness and headaches.  Endo/Heme/Allergies: Negative for polydipsia.  Psychiatric/Behavioral: Negative for depression and suicidal ideas. The patient is not nervous/anxious and does not have insomnia.     Objective  BP 117/60 mmHg  Pulse 67  Temp(Src) 98.1 F (36.7 C) (Oral)  Ht 5' 6.25" (1.683 m)  Wt 190 lb 3.2 oz (86.274 kg)  BMI 30.46 kg/m2  SpO2 98%  Physical Exam  Physical Exam  Constitutional: She is oriented to person, place, and time and well-developed, well-nourished, and in no distress. No distress.  HENT:  Head: Normocephalic and atraumatic.  Eyes: Conjunctivae are normal.  Neck: Neck supple. No thyromegaly present.  Cardiovascular: Normal rate, regular rhythm and normal heart sounds.   No murmur heard. Pulmonary/Chest: Effort normal and breath sounds normal. She has no wheezes.  Abdominal: She  exhibits no distension and no mass.  Musculoskeletal: She exhibits no edema.  Lymphadenopathy:    She has no cervical adenopathy.  Neurological: She is alert and oriented to person, place, and time.  Skin: Skin is warm and dry. No rash noted. She is not diaphoretic.  Psychiatric: Memory, affect and judgment normal.    No results found for: TSH Lab Results  Component  Value Date   WBC 13.0* 09/19/2013   HGB 13.9 09/19/2013   HCT 41.9 09/19/2013   MCV 92.3 09/19/2013   PLT 258 09/19/2013   Lab Results  Component Value Date   CREATININE 0.66 09/19/2013   BUN 15 09/19/2013   NA 137 09/19/2013   K 4.2 09/19/2013   CL 99 09/19/2013   CO2 26 09/19/2013   No results found for: ALT, AST, GGT, ALKPHOS, BILITOT No results found for: CHOL No results found for: HDL No results found for: LDLCALC No results found for: TRIG No results found for: CHOLHDL   Assessment & Plan  GERD Avoid offending foods, start probiotics. Do not eat large meals in late evening and consider raising head of bed.   Hypothyroidism On Levothyroxine, continue to monitor  HTN (hypertension) Follows with endocrinology dr Chalmers Cater hgba1c acceptable, minimize simple carbs. Increase exercise as tolerated. Continue current meds  Brings in labs for review hgba1c 5.9 on 08/14/14  Obesity Encouraged DASH diet, decrease po intake and increase exercise as tolerated. Needs 7-8 hours of sleep nightly. Avoid trans fats, eat small, frequent meals every 4-5 hours with lean proteins, complex carbs and healthy fats. Minimize simple carbs, GMO foods.

## 2014-11-18 NOTE — Assessment & Plan Note (Signed)
On Levothyroxine, continue to monitor 

## 2014-11-18 NOTE — Assessment & Plan Note (Signed)
Encouraged DASH diet, decrease po intake and increase exercise as tolerated. Needs 7-8 hours of sleep nightly. Avoid trans fats, eat small, frequent meals every 4-5 hours with lean proteins, complex carbs and healthy fats. Minimize simple carbs, GMO foods. 

## 2014-11-18 NOTE — Assessment & Plan Note (Addendum)
Follows with endocrinology dr Chalmers Cater hgba1c acceptable, minimize simple carbs. Increase exercise as tolerated. Continue current meds  Brings in labs for review hgba1c 5.9 on 08/14/14

## 2014-11-18 NOTE — Assessment & Plan Note (Signed)
Avoid offending foods, start probiotics. Do not eat large meals in late evening and consider raising head of bed.  

## 2014-12-16 ENCOUNTER — Other Ambulatory Visit: Payer: Self-pay | Admitting: Family Medicine

## 2014-12-17 NOTE — Telephone Encounter (Signed)
Requesting Tramadol 50mg -Take 1 tablet by mouth every 6 hours as needed for moderate to severe pain. Last refill:11/12/14;#120,0 Last OV:11/12/14 Please advise.//AB/CMA

## 2014-12-19 NOTE — Telephone Encounter (Signed)
Rx printed and signed and faxed to the pharmacy.//AB/CMA

## 2014-12-26 DIAGNOSIS — J00 Acute nasopharyngitis [common cold]: Secondary | ICD-10-CM | POA: Diagnosis not present

## 2015-01-14 ENCOUNTER — Other Ambulatory Visit: Payer: Self-pay | Admitting: Family Medicine

## 2015-01-14 NOTE — Telephone Encounter (Signed)
signed

## 2015-01-14 NOTE — Telephone Encounter (Signed)
Last filled: 12/18/14 Amt: 120, 0 refills Last OV:  11/12/14   Please advise.

## 2015-01-27 ENCOUNTER — Telehealth: Payer: Self-pay | Admitting: Family Medicine

## 2015-01-27 NOTE — Telephone Encounter (Signed)
Caller name:Caidynce Delph Relationship to patient:Self Can be reached:CBR in chart [facility] [place]  Reason for call: PT requesting to change from Albuterol to Flovent - states has not been on med for awhile but prefers Flovent

## 2015-01-29 NOTE — Telephone Encounter (Signed)
So Flovent is not like albuterol. Albuterol is for rescue, Flovent is for maintenance and would replace the Advair not the Albuterol. She cannot have Flovent and Advair together.

## 2015-01-29 NOTE — Telephone Encounter (Signed)
Please advise 

## 2015-01-30 MED ORDER — ALBUTEROL SULFATE HFA 108 (90 BASE) MCG/ACT IN AERS: 2.0000 | INHALATION_SPRAY | Freq: Four times a day (QID) | RESPIRATORY_TRACT | Status: DC | PRN

## 2015-01-30 NOTE — Telephone Encounter (Signed)
Called the patient informed of PCP instructions.  The paitent did verbalize understanding of instructions.  Does want a refill then of the albuterol and will take care of.

## 2015-02-06 ENCOUNTER — Other Ambulatory Visit: Payer: Self-pay | Admitting: Family Medicine

## 2015-02-06 MED ORDER — FLUTICASONE-SALMETEROL 250-50 MCG/DOSE IN AEPB: 1.0000 | INHALATION_SPRAY | Freq: Two times a day (BID) | RESPIRATORY_TRACT | Status: DC

## 2015-02-07 NOTE — Telephone Encounter (Signed)
Faxed hardcopy [facility] [place] 

## 2015-02-19 DIAGNOSIS — E039 Hypothyroidism, unspecified: Secondary | ICD-10-CM | POA: Diagnosis not present

## 2015-02-19 DIAGNOSIS — E780 Pure hypercholesterolemia: Secondary | ICD-10-CM | POA: Diagnosis not present

## 2015-02-19 DIAGNOSIS — E559 Vitamin D deficiency, unspecified: Secondary | ICD-10-CM | POA: Diagnosis not present

## 2015-02-19 DIAGNOSIS — E119 Type 2 diabetes mellitus without complications: Secondary | ICD-10-CM | POA: Diagnosis not present

## 2015-02-21 ENCOUNTER — Telehealth: Payer: Self-pay | Admitting: Family Medicine

## 2015-02-21 NOTE — Telephone Encounter (Signed)
Caller name: Ayzia, Day Relation to pt: self  Call back number: [phone]   Reason for call:  Pt requesting a 3D Mamo and Bone Density order, pt would has an appointment for March 18th at  [facility] [place] telephone [phone].  Pt would like a confirmation call confirming orders have been placed.

## 2015-02-23 ENCOUNTER — Other Ambulatory Visit: Payer: Self-pay | Admitting: Family Medicine

## 2015-02-23 DIAGNOSIS — Z1239 Encounter for other screening for malignant neoplasm of breast: Secondary | ICD-10-CM

## 2015-02-23 DIAGNOSIS — M8580 Other specified disorders of bone density and structure, unspecified site: Secondary | ICD-10-CM

## 2015-02-23 NOTE — Telephone Encounter (Signed)
I have ordered please notify patient and forward orders to [facility]

## 2015-02-24 NOTE — Telephone Encounter (Signed)
Patient requested to have a 3D mammogram ordered. Please re order. Thanks

## 2015-02-24 NOTE — Telephone Encounter (Signed)
Patient informed orders by PCP have been entered.

## 2015-02-24 NOTE — Telephone Encounter (Signed)
No I think these show up as orders not referrals

## 2015-02-24 NOTE — Telephone Encounter (Signed)
Patient has been informed of orders, but I do not see order in referral notes.

## 2015-02-26 DIAGNOSIS — E780 Pure hypercholesterolemia: Secondary | ICD-10-CM | POA: Diagnosis not present

## 2015-02-26 DIAGNOSIS — E049 Nontoxic goiter, unspecified: Secondary | ICD-10-CM | POA: Diagnosis not present

## 2015-02-26 DIAGNOSIS — E119 Type 2 diabetes mellitus without complications: Secondary | ICD-10-CM | POA: Diagnosis not present

## 2015-02-26 DIAGNOSIS — E039 Hypothyroidism, unspecified: Secondary | ICD-10-CM | POA: Diagnosis not present

## 2015-02-27 ENCOUNTER — Other Ambulatory Visit: Payer: Self-pay | Admitting: Endocrinology

## 2015-02-27 DIAGNOSIS — E049 Nontoxic goiter, unspecified: Secondary | ICD-10-CM

## 2015-03-03 DIAGNOSIS — M4316 Spondylolisthesis, lumbar region: Secondary | ICD-10-CM | POA: Diagnosis not present

## 2015-03-04 NOTE — Telephone Encounter (Signed)
Patient states that [facility] did not receive fax and would like a callback this evening.

## 2015-03-04 NOTE — Telephone Encounter (Signed)
Patient wants to know if it makes a difference if you put in a 1 before the [phone] fax #

## 2015-03-04 NOTE — Telephone Encounter (Signed)
Faxed order to [facility] at fax number below. Called the patient to inform left a detailed message.

## 2015-03-04 NOTE — Telephone Encounter (Signed)
Called the patient informed order has been faxed and did receive confirmation they received.

## 2015-03-04 NOTE — Telephone Encounter (Signed)
Called [facility] and refaxed to [phone]

## 2015-03-04 NOTE — Telephone Encounter (Signed)
Pt is following up, states that [facility] informed her this morning that they did not get the order, pt states they gave her another fax number [phone], would like for you to resend the order please.

## 2015-03-04 NOTE — Telephone Encounter (Signed)
Pt would like for you to call her back with confirmation

## 2015-03-04 NOTE — Telephone Encounter (Signed)
With [facility] the [facility] order has to be printed and manually faxed I have printed

## 2015-03-04 NOTE — Telephone Encounter (Signed)
3D mammogram and bone density

## 2015-03-06 ENCOUNTER — Telehealth: Payer: Self-pay | Admitting: Family Medicine

## 2015-03-06 NOTE — Telephone Encounter (Signed)
Need Mammogram order to say Diagnostic, pt is having tomorrow, please fax to [phone]

## 2015-03-06 NOTE — Telephone Encounter (Signed)
I signed paper copy of order today

## 2015-03-07 ENCOUNTER — Ambulatory Visit
Admission: RE | Admit: 2015-03-07 | Discharge: 2015-03-07 | Disposition: A | Discharge status: Home / Self Care | Payer: Medicare Other | Source: Ambulatory Visit | Attending: Endocrinology | Admitting: Endocrinology

## 2015-03-07 DIAGNOSIS — R920 Mammographic microcalcification found on diagnostic imaging of breast: Secondary | ICD-10-CM | POA: Diagnosis not present

## 2015-03-07 DIAGNOSIS — E049 Nontoxic goiter, unspecified: Secondary | ICD-10-CM

## 2015-03-07 DIAGNOSIS — M8580 Other specified disorders of bone density and structure, unspecified site: Secondary | ICD-10-CM | POA: Diagnosis not present

## 2015-03-07 DIAGNOSIS — M4316 Spondylolisthesis, lumbar region: Secondary | ICD-10-CM | POA: Diagnosis not present

## 2015-03-07 DIAGNOSIS — E042 Nontoxic multinodular goiter: Secondary | ICD-10-CM | POA: Diagnosis not present

## 2015-03-07 LAB — HM MAMMOGRAPHY

## 2015-03-07 LAB — HM DEXA SCAN

## 2015-03-11 ENCOUNTER — Encounter: Payer: Self-pay | Admitting: General Practice

## 2015-03-12 DIAGNOSIS — M4316 Spondylolisthesis, lumbar region: Secondary | ICD-10-CM | POA: Diagnosis not present

## 2015-03-15 DIAGNOSIS — M4316 Spondylolisthesis, lumbar region: Secondary | ICD-10-CM | POA: Diagnosis not present

## 2015-03-18 DIAGNOSIS — M4316 Spondylolisthesis, lumbar region: Secondary | ICD-10-CM | POA: Diagnosis not present

## 2015-03-20 ENCOUNTER — Other Ambulatory Visit: Payer: Self-pay | Admitting: Family Medicine

## 2015-03-20 NOTE — Telephone Encounter (Signed)
Last Refill 02/06/15 for Tramadol #120 Last  Office Visit 11/12/14 Advise on Refill

## 2015-03-25 ENCOUNTER — Telehealth: Payer: Self-pay | Admitting: *Deleted

## 2015-03-25 DIAGNOSIS — M4316 Spondylolisthesis, lumbar region: Secondary | ICD-10-CM | POA: Diagnosis not present

## 2015-03-25 NOTE — Telephone Encounter (Signed)
Please see below.

## 2015-03-25 NOTE — Telephone Encounter (Signed)
Requesting Tramadol HCL 50mg -Take 1 tablet every 6 hours as needed for moderate to severe pain. Refill:02-07-15;120,0 Last OV:11-12-14 USD:11-12-14 Please advise.//AB/CMA

## 2015-03-26 MED ORDER — TRAMADOL HCL 50 MG PO TABS: 50.0000 mg | ORAL_TABLET | Freq: Four times a day (QID) | ORAL | Status: DC | PRN

## 2015-03-26 NOTE — Telephone Encounter (Signed)
OK to refill her Tramadol same sig, same number, same strength but needs appt for 6 month mark prior to another refill

## 2015-03-26 NOTE — Telephone Encounter (Signed)
Rx printed, awaiting MD signature.  

## 2015-03-27 NOTE — Telephone Encounter (Signed)
[facility] [place]  

## 2015-03-28 DIAGNOSIS — M4316 Spondylolisthesis, lumbar region: Secondary | ICD-10-CM | POA: Diagnosis not present

## 2015-04-02 DIAGNOSIS — M4316 Spondylolisthesis, lumbar region: Secondary | ICD-10-CM | POA: Diagnosis not present

## 2015-04-04 DIAGNOSIS — M4316 Spondylolisthesis, lumbar region: Secondary | ICD-10-CM | POA: Diagnosis not present

## 2015-04-09 DIAGNOSIS — M4316 Spondylolisthesis, lumbar region: Secondary | ICD-10-CM | POA: Diagnosis not present

## 2015-04-11 DIAGNOSIS — M4316 Spondylolisthesis, lumbar region: Secondary | ICD-10-CM | POA: Diagnosis not present

## 2015-04-14 ENCOUNTER — Other Ambulatory Visit: Payer: Self-pay | Admitting: Family Medicine

## 2015-04-14 DIAGNOSIS — M4316 Spondylolisthesis, lumbar region: Secondary | ICD-10-CM | POA: Diagnosis not present

## 2015-04-14 MED ORDER — FUROSEMIDE 20 MG PO TABS: 20.0000 mg | ORAL_TABLET | Freq: Every day | ORAL | Status: DC | PRN

## 2015-04-17 DIAGNOSIS — M4316 Spondylolisthesis, lumbar region: Secondary | ICD-10-CM | POA: Diagnosis not present

## 2015-04-22 DIAGNOSIS — C021 Malignant neoplasm of border of tongue: Secondary | ICD-10-CM | POA: Diagnosis not present

## 2015-04-28 LAB — HM DIABETES EYE EXAM

## 2015-04-29 DIAGNOSIS — C021 Malignant neoplasm of border of tongue: Secondary | ICD-10-CM | POA: Diagnosis not present

## 2015-04-29 DIAGNOSIS — M4316 Spondylolisthesis, lumbar region: Secondary | ICD-10-CM | POA: Diagnosis not present

## 2015-05-01 DIAGNOSIS — C021 Malignant neoplasm of border of tongue: Secondary | ICD-10-CM | POA: Diagnosis not present

## 2015-05-01 DIAGNOSIS — M4316 Spondylolisthesis, lumbar region: Secondary | ICD-10-CM | POA: Diagnosis not present

## 2015-05-05 ENCOUNTER — Other Ambulatory Visit: Payer: Self-pay | Admitting: Family Medicine

## 2015-05-05 MED ORDER — TRAMADOL HCL 50 MG PO TABS: 50.0000 mg | ORAL_TABLET | Freq: Four times a day (QID) | ORAL | Status: DC | PRN

## 2015-05-05 NOTE — Telephone Encounter (Signed)
Last refill 03/26/15  #120 with 0 refills Last Office visit 10/24/14 No upcoming scheduled appt.   Advise please.

## 2015-05-05 NOTE — Telephone Encounter (Signed)
Faxed hardcopy for Tramadol #[facility] to [facility] [place].

## 2015-05-07 ENCOUNTER — Other Ambulatory Visit: Payer: Self-pay | Admitting: Otolaryngology

## 2015-05-07 DIAGNOSIS — C021 Malignant neoplasm of border of tongue: Secondary | ICD-10-CM | POA: Diagnosis not present

## 2015-05-07 DIAGNOSIS — IMO0002 Reserved for concepts with insufficient information to code with codable children: Secondary | ICD-10-CM

## 2015-05-07 DIAGNOSIS — C029 Malignant neoplasm of tongue, unspecified: Secondary | ICD-10-CM | POA: Diagnosis not present

## 2015-05-12 ENCOUNTER — Encounter: Payer: Self-pay | Admitting: Family Medicine

## 2015-05-12 ENCOUNTER — Ambulatory Visit
Admission: RE | Admit: 2015-05-12 | Discharge: 2015-05-12 | Disposition: A | Discharge status: Home / Self Care | Payer: Medicare Other | Source: Ambulatory Visit | Attending: Otolaryngology | Admitting: Otolaryngology

## 2015-05-12 DIAGNOSIS — C029 Malignant neoplasm of tongue, unspecified: Secondary | ICD-10-CM | POA: Diagnosis not present

## 2015-05-12 DIAGNOSIS — IMO0002 Reserved for concepts with insufficient information to code with codable children: Secondary | ICD-10-CM

## 2015-05-12 MED ORDER — IOHEXOL 300 MG/ML  SOLN
75.0000 mL | Freq: Once | INTRAMUSCULAR | Status: AC | PRN
  Administered 2015-05-12: 75 mL via INTRAVENOUS

## 2015-05-15 ENCOUNTER — Other Ambulatory Visit: Payer: Self-pay | Admitting: Family Medicine

## 2015-05-15 DIAGNOSIS — G4700 Insomnia, unspecified: Secondary | ICD-10-CM

## 2015-05-15 NOTE — Telephone Encounter (Signed)
She has not been seen for 6 months, she needs an appt before I can refill this controlled substance

## 2015-05-15 NOTE — Telephone Encounter (Signed)
Caller name: Otillia Cordone Relationship to patient: self Can be reached: [phone] Pharmacy: [facility]  Reason for call: Pt called about RX for zolpidem (AMBIEN) 10 MG tablet. 30 day supply at a time. She said she is due for yearly renewal and they need provider to call and notify for RX to continue. Case #: [id number]. Call Ph# [phone]

## 2015-05-15 NOTE — Telephone Encounter (Signed)
Called pt. She wanted to come in the next 2 weeks. Only appt available was 7:00am 5/31. She cannot come that early as she is 40 miles away taking care of her mother. She scheduled appt for 06/13/15. If there is another spot that she can be fit into in the next 2 weeks she states she would appreciate it.

## 2015-05-15 NOTE — Telephone Encounter (Signed)
Last office visit 11/12/24 Last refill  #30 with 5 refills on 11/12/14  Advise on refill

## 2015-05-15 NOTE — Telephone Encounter (Signed)
There are numerous days I could do a quick visit at 7:30 but that may be too early also. If she cannot do that it gets tougher but I will look again

## 2015-05-16 MED ORDER — ZOLPIDEM TARTRATE 10 MG PO TABS: 10.0000 mg | ORAL_TABLET | Freq: Every evening | ORAL | Status: DC | PRN

## 2015-05-16 MED ORDER — ZOLPIDEM TARTRATE 10 MG PO TABS
10.0000 mg | ORAL_TABLET | Freq: Every evening | ORAL | Status: DC | PRN
Start: 2015-05-16 — End: 2015-05-16

## 2015-05-16 NOTE — Telephone Encounter (Signed)
Advise on appt. For this patient.

## 2015-05-16 NOTE — Addendum Note (Signed)
Addended by: Sharon Seller B on: 05/16/2015 04:50 PM   Modules accepted: Orders

## 2015-05-16 NOTE — Telephone Encounter (Signed)
So I am willing to see her at 7:30 but I do not know if she can do that since she could not do 7 we need to ask her. I will approve a one month supply of Ambien for now since she has an appt on 6/24

## 2015-05-16 NOTE — Telephone Encounter (Signed)
Faxed hardcopy for [facility]pidem to [facility] [place].

## 2015-05-27 DIAGNOSIS — C029 Malignant neoplasm of tongue, unspecified: Secondary | ICD-10-CM | POA: Diagnosis not present

## 2015-05-28 ENCOUNTER — Other Ambulatory Visit: Payer: Self-pay | Admitting: Family Medicine

## 2015-05-29 NOTE — Telephone Encounter (Signed)
Faxed hardcopy for Tramadol to [facility] [place]. 

## 2015-05-29 NOTE — Telephone Encounter (Signed)
Faxed hardcopy for Alprazolam to [facility] [place].

## 2015-06-02 ENCOUNTER — Other Ambulatory Visit: Payer: Self-pay | Admitting: Family Medicine

## 2015-06-05 DIAGNOSIS — D101 Benign neoplasm of tongue: Secondary | ICD-10-CM | POA: Diagnosis not present

## 2015-06-05 DIAGNOSIS — K148 Other diseases of tongue: Secondary | ICD-10-CM | POA: Diagnosis not present

## 2015-06-05 DIAGNOSIS — C029 Malignant neoplasm of tongue, unspecified: Secondary | ICD-10-CM | POA: Diagnosis not present

## 2015-06-13 ENCOUNTER — Encounter: Payer: Self-pay | Admitting: Family Medicine

## 2015-06-13 ENCOUNTER — Ambulatory Visit (INDEPENDENT_AMBULATORY_CARE_PROVIDER_SITE_OTHER): Payer: Medicare Other | Admitting: Family Medicine

## 2015-06-13 VITALS — BP 114/74 | HR 86 | Temp 98.3°F | Ht 66.0 in | Wt 199.1 lb

## 2015-06-13 DIAGNOSIS — G4700 Insomnia, unspecified: Secondary | ICD-10-CM | POA: Diagnosis not present

## 2015-06-13 DIAGNOSIS — E119 Type 2 diabetes mellitus without complications: Secondary | ICD-10-CM

## 2015-06-13 DIAGNOSIS — E1169 Type 2 diabetes mellitus with other specified complication: Secondary | ICD-10-CM

## 2015-06-13 DIAGNOSIS — I10 Essential (primary) hypertension: Secondary | ICD-10-CM

## 2015-06-13 DIAGNOSIS — E559 Vitamin D deficiency, unspecified: Secondary | ICD-10-CM

## 2015-06-13 DIAGNOSIS — E669 Obesity, unspecified: Secondary | ICD-10-CM

## 2015-06-13 DIAGNOSIS — M899 Disorder of bone, unspecified: Secondary | ICD-10-CM | POA: Diagnosis not present

## 2015-06-13 DIAGNOSIS — K148 Other diseases of tongue: Secondary | ICD-10-CM

## 2015-06-13 DIAGNOSIS — E039 Hypothyroidism, unspecified: Secondary | ICD-10-CM

## 2015-06-13 DIAGNOSIS — K219 Gastro-esophageal reflux disease without esophagitis: Secondary | ICD-10-CM

## 2015-06-13 DIAGNOSIS — M949 Disorder of cartilage, unspecified: Secondary | ICD-10-CM

## 2015-06-13 MED ORDER — ZOLPIDEM TARTRATE 10 MG PO TABS: 5.0000 mg | ORAL_TABLET | Freq: Every evening | ORAL | Status: DC | PRN

## 2015-06-13 MED ORDER — FUROSEMIDE 20 MG PO TABS: 20.0000 mg | ORAL_TABLET | Freq: Every day | ORAL | Status: DC | PRN

## 2015-06-13 MED ORDER — TRAMADOL HCL 50 MG PO TABS: ORAL_TABLET | ORAL | Status: DC

## 2015-06-13 NOTE — Patient Instructions (Signed)
Squamous Cell Carcinoma   Squamous cell carcinoma is the second most common form of skin cancer. It begins in the squamous cells in the outer layer of the skin (epidermis).   CAUSES   Ultraviolet light exposure is the most common cause of squamous cell carcinoma. This may come from sunlight or tanning beds. Squamous cell carcinoma is most common in sun-exposed areas like the face, neck, arms, and hands. However, squamous cell carcinoma can occur anywhere on the body, including the lips, inside the mouth, the legs, sites of long-term (chronic) scarring, and the anus.   Other causes of squamous cell carcinoma can include:  · Exposure to arsenic.  · Exposure to radiation.  · Exposure to toxic tars and oils.  RISK FACTORS  Factors that increase your risk for squamous cell carcinoma include:  · Having fair skin.  · Being middle-aged or elderly.  · Heavy sun exposure, especially during childhood.  · Repeated sunburns.  · Use of tanning beds.  · A weakened immune system. This includes patients who have received a transplant and patients with human immunodeficiency virus (HIV) or acquired immunodeficiency syndrome (AIDS).  · Human papillomavirus infection.  · Conditions that cause chronic scarring. This can include burn scars, chronic ulcers, heat (thermal) injuries, and radiation.  · Exposure to psoralen plus ultraviolet A light therapy.  · Exposure to chemical carcinogens, such as tar, soot, and arsenic.  · Chronic inflammatory conditions such as lupus, lichen planus, or lichen sclerosus.  · Chronic infections, such as infections of the bone (osteomyelitis).  · Smoking.  SYMPTOMS   Squamous cell carcinoma often starts as small, skin-colored (pink or brown), sandpaper-like growths. These growths are called solar keratoses or actinic keratoses. These growths are often more easily felt than seen.   DIAGNOSIS   Your caregiver may be able to tell what is wrong by doing a physical exam. Often, a tissue sample is also taken. The  tissue sample is examined under a microscope.   TREATMENT   The treatment for squamous cell carcinoma depends on the size and location of the tumors, as well as your overall health. Possible treatments include:   · Mohs surgery. This is a procedure done by a skin doctor (dermatologist or Mohs surgeon) in his or her office. The cancerous cells are removed layer by layer.  · Laser surgery to remove the tumor.  · Freezing the tumor with liquid nitrogen (cryosurgery).  · Radiation. This may be used for tumors on the face.  · Electrodesiccation and curettage. This involves alternately scraping and burning the tumor, using an electric current to control bleeding.  If treated soon enough, squamous cell carcinoma rarely spreads to other areas of the body (metastasizes). If left untreated, however, squamous cell carcinoma will destroy the nearby tissues. This can result in the loss of a nose or ear.  PREVENTION  · Avoid the sun between 10:00 a.m. and 4:00 p.m. when it is the strongest.  · Use a sunscreen or sunblock with sun protection factor 30 or greater.  · Apply sunscreen at least 30 minutes before exposure to the sun.  · Reapply sunscreen every 2 to 4 hours while you are outside, after swimming, and after excessive sweating.  · Always wear protective hats, clothing, and sunglasses with ultraviolet protection.  · Avoid tanning beds.  HOME CARE INSTRUCTIONS   · Avoid unprotected sun exposure.  · Do not smoke.  · Follow your caregiver's instructions for self-exams. Look for new growths or changes in   your skin.  · Keep all follow-up appointments as directed by your caregiver.  SEEK MEDICAL CARE IF:   · You notice any new growths or changes in your skin.  · You have had a squamous cell carcinoma tumor removed and you notice a new growth in the same location.  Document Released: 06/12/2003 Document Revised: 04/22/2014 Document Reviewed: 08/30/2011  Exit[employer]® Patient Information ©2015 [employer]. This information is not  intended to replace advice given to you by your health care provider. Make sure you discuss any questions you have with your health care provider.

## 2015-06-13 NOTE — Progress Notes (Signed)
Pre visit review using our clinic review tool, if applicable. No additional management support is needed unless otherwise documented below in the visit note. 

## 2015-06-14 LAB — VITAMIN D 25 HYDROXY (VIT D DEFICIENCY, FRACTURES): VIT D 25 HYDROXY: 30 ng/mL (ref 30–100)

## 2015-06-16 ENCOUNTER — Telehealth: Payer: Self-pay | Admitting: *Deleted

## 2015-06-16 NOTE — Telephone Encounter (Signed)
Prior authorization for Proventil inhaler initiated. Awaiting determination. JG//CMA

## 2015-06-16 NOTE — Telephone Encounter (Signed)
Prior authorization for zolpidem 10 mg tablets initiated. Awaiting determination. JG//CMA

## 2015-06-18 DIAGNOSIS — C029 Malignant neoplasm of tongue, unspecified: Secondary | ICD-10-CM | POA: Diagnosis not present

## 2015-06-29 ENCOUNTER — Encounter: Payer: Self-pay | Admitting: Family Medicine

## 2015-06-29 DIAGNOSIS — E1169 Type 2 diabetes mellitus with other specified complication: Secondary | ICD-10-CM | POA: Insufficient documentation

## 2015-06-29 DIAGNOSIS — E669 Obesity, unspecified: Secondary | ICD-10-CM

## 2015-06-29 HISTORY — DX: Type 2 diabetes mellitus with other specified complication: E11.69

## 2015-06-29 HISTORY — DX: Type 2 diabetes mellitus with other specified complication: E66.9

## 2015-06-29 NOTE — Assessment & Plan Note (Signed)
Encouraged DASH diet, decrease po intake and increase exercise as tolerated. Needs 7-8 hours of sleep nightly. Avoid trans fats, eat small, frequent meals every 4-5 hours with lean proteins, complex carbs and healthy fats. Minimize simple carbs 

## 2015-06-29 NOTE — Assessment & Plan Note (Signed)
Tolerating statin, encouraged heart healthy diet, avoid trans fats, minimize simple carbs and saturated fats. Increase exercise as tolerated 

## 2015-06-29 NOTE — Assessment & Plan Note (Signed)
Well controlled, no changes to meds. Encouraged heart healthy diet such as the DASH diet and exercise as tolerated.  °

## 2015-06-29 NOTE — Assessment & Plan Note (Signed)
Vitamin D WNL, continue supplements and calcium tid, stay as active as tolerated

## 2015-06-29 NOTE — Progress Notes (Signed)
Amanda Carlson  [id number] 06-12-1946 06/29/2015      Progress Note-Follow Up  Subjective  Chief Complaint  Chief Complaint  Patient presents with  . Follow-up    medications    HPI  Patient is a 69 y.o. female in today for routine medical care. Patient is in today for follow up on numerous conditions. She has surgery scheduled for July 22 with Dr. 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 She is generally feeling well but is anxious to proceed with excision. Does endorse some fatigue but denies any other acute illness or recent fevers. Continues to follow with Dr. Soyla Murphy for her diabetes and reports that is well controlled. No polyuria or polydipsia. Denies CP/palp/SOB/HA/congestion/fevers/GI or GU c/o. Taking meds as prescribed   Past Medical History  Diagnosis Date  . Knee pain, right 10/16/2011  . Knee pain, bilateral 10/16/2011  . HYPOTHYROIDISM 01/22/2011    Qualifier: Diagnosis of  By: Charlett Blake MD, Boyce Medici with Dr Chalmers Cater   . Overweight(278.02) 01/22/2011    Qualifier: Diagnosis of  By: Charlett Blake MD, Erline Levine    . Intrinsic asthma, unspecified 01/22/2011    Qualifier: Diagnosis of  By: Charlett Blake MD, Erline Levine    . THYROID CYST 01/22/2011    Qualifier: Diagnosis of  By: Charlett Blake MD, Erline Levine    . ALLERGIC RHINITIS, SEASONAL 01/22/2011    Qualifier: Diagnosis of  By: Charlett Blake MD, Erline Levine    . GERD 01/22/2011    Qualifier: Diagnosis of  By: Charlett Blake MD, Erline Levine    . Irritable bowel syndrome 01/22/2011    Qualifier: Diagnosis of  By: Charlett Blake MD, Erline Levine    . CTS (carpal tunnel syndrome) 06/07/2012  . Tinea corporis 06/07/2012  . Insomnia 06/07/2012  . Low back pain 10/04/2012  . Sleep apnea   . Tongue lesion 01/18/2014  . Lesion of breast 01/18/2014    Calcified Right side? Follows at [facility]  . Cervical cancer screening 04/11/2014  . Medicare annual wellness visit, subsequent 04/14/2014  . Diabetes mellitus without complication   . Diabetes  mellitus type 2 in obese 06/29/2015    Past Surgical History  Procedure Laterality Date  . Knee arthroscopy      right  . Cholecystectomy    . Tubal ligation    . Cervical fusion, discectomy, metal plate and 2 screws in place    . Thyroidectomy, partial      Family History  Problem Relation Age of Onset  . Atrial fibrillation Mother   . Other Mother     Back pain, CHF  . Arthritis Mother   . Osteoporosis Mother   . Heart disease Father   . Hypertension Father   . Diabetes Father   . Sleep apnea Father   . Atrial fibrillation Father     paroxysmal  . Cancer Father     skin cancers on face  . Diabetes Brother   . Diabetes Maternal Aunt     X 2 aunts  . Cancer Maternal Grandmother   . Diabetes Maternal Grandfather   . Stroke Paternal Grandmother   . Heart disease Paternal Grandfather   . Stroke Paternal Grandfather     History   Social History  . Marital Status: Divorced    Spouse Name: N/A  . Number of Children: N/A  . Years of Education: N/A   Occupational History  . Not on file.   Social History Main Topics  .  Smoking status: Never Smoker   . Smokeless tobacco: Never Used  . Alcohol Use: 0.0 oz/week    0 drink(s) per week     Comment: No intake of alcohol in greater than 10 yrs  . Drug Use: No  . Sexual Activity: Not on file   Other Topics Concern  . Not on file   Social History Narrative    Current Outpatient Prescriptions on File Prior to Visit  Medication Sig Dispense Refill  . ADVAIR DISKUS 250-50 MCG/DOSE AEPB INAHLE 1 PUFF INTO LUNGS EVERY 12 HOURS 60 each 3  . albuterol (PROVENTIL HFA;VENTOLIN HFA) 108 (90 BASE) MCG/ACT inhaler Inhale 2 puffs into the lungs every 6 (six) hours as needed for wheezing. 1 Inhaler 1  . ALPRAZolam (XANAX) 0.25 MG tablet TAKE 1 TABLET BY MOUTH EVERY DAY AS NEEDED FOR ANXIETY OR SLEEP 30 tablet 1  . amoxicillin (AMOXIL) 500 MG tablet Take 2,000 mg by mouth once as needed. When go to the dentist    . aspirin 81 MG  tablet Take 81 mg by mouth daily.    . baclofen (LIORESAL) 20 MG tablet TAKE 1 TABLET BY MOUTH 4 TIMES A DAY AS NEEDED FOR MUSCLE SPASMS 120 tablet 1  . cetirizine (ZYRTEC) 10 MG tablet Take 10 mg by mouth daily as needed.     . Cholecalciferol (VITAMIN D) 1000 UNITS capsule Take 2,000 Units by mouth daily.     . fluticasone (FLONASE) 50 MCG/ACT nasal spray Place 2 sprays into both nostrils daily as needed for allergies or rhinitis. 16 g 6  . Fluticasone-Salmeterol (ADVAIR) 250-50 MCG/DOSE AEPB Inhale 1 puff into the lungs every 12 (twelve) hours. 60 each 3  . Krill Oil 300 MG CAPS Take 300 mg by mouth daily.    Marland Kitchen levothyroxine (SYNTHROID, LEVOTHROID) 100 MCG tablet Take 100 mcg by mouth daily before breakfast.    . metFORMIN (GLUCOPHAGE-XR) 500 MG 24 hr tablet Take 500 mg by mouth 2 (two) times daily.     . simvastatin (ZOCOR) 80 MG tablet Take 80 mg by mouth daily.    Marland Kitchen zoster vaccine live, PF, (ZOSTAVAX) 40086 UNT/0.65ML injection Inject 19,400 Units into the skin once. 1 each 0   No current facility-administered medications on file prior to visit.    Allergies  Allergen Reactions  . Codeine     REACTION: causes itching  . Tetanus Toxoid     REACTION: NO Tetanus shot  . Adhesive [Tape] Rash    Review of Systems  Review of Systems  Constitutional: Positive for malaise/fatigue. Negative for fever.  HENT: Negative for congestion.   Eyes: Negative for discharge.  Respiratory: Negative for shortness of breath.   Cardiovascular: Negative for chest pain, palpitations and leg swelling.  Gastrointestinal: Negative for nausea, abdominal pain and diarrhea.  Genitourinary: Negative for dysuria.  Musculoskeletal: Negative for falls.  Skin: Negative for rash.  Neurological: Negative for loss of consciousness and headaches.  Endo/Heme/Allergies: Negative for polydipsia.  Psychiatric/Behavioral: Negative for depression and suicidal ideas. The patient is not nervous/anxious and does not have  insomnia.     Objective  BP 114/74 mmHg  Pulse 86  Temp(Src) 98.3 F (36.8 C) (Oral)  Ht 5\' 6"  (1.676 m)  Wt 199 lb 2 oz (90.323 kg)  BMI 32.16 kg/m2  SpO2 97%  Physical Exam  Physical Exam  Constitutional: She is oriented to person, place, and time and well-developed, well-nourished, and in no distress. No distress.  HENT:  Head: Normocephalic and atraumatic.  Eyes: Conjunctivae are normal.  Neck: Neck supple. No thyromegaly present.  Cardiovascular: Normal rate, regular rhythm and normal heart sounds.   No murmur heard. Pulmonary/Chest: Effort normal and breath sounds normal. She has no wheezes.  Abdominal: She exhibits no distension and no mass.  Musculoskeletal: She exhibits no edema.  Lymphadenopathy:    She has no cervical adenopathy.  Neurological: She is alert and oriented to person, place, and time.  Skin: Skin is warm and dry. No rash noted. She is not diaphoretic.  Psychiatric: Memory, affect and judgment normal.    No results found for: TSH Lab Results  Component Value Date   WBC 13.0* 09/19/2013   HGB 13.9 09/19/2013   HCT 41.9 09/19/2013   MCV 92.3 09/19/2013   PLT 258 09/19/2013   Lab Results  Component Value Date   CREATININE 0.66 09/19/2013   BUN 15 09/19/2013   NA 137 09/19/2013   K 4.2 09/19/2013   CL 99 09/19/2013   CO2 26 09/19/2013   No results found for: ALT, AST, GGT, ALKPHOS, BILITOT No results found for: CHOL No results found for: HDL No results found for: LDLCALC No results found for: TRIG No results found for: CHOLHDL   Assessment & Plan  Tongue lesion Has surgery scheduled with Dr Janace Hoard for excision and radical dissection of neck for SCC. Patient doing well but anxious to proceed  Obesity Encouraged DASH diet, decrease po intake and increase exercise as tolerated. Needs 7-8 hours of sleep nightly. Avoid trans fats, eat small, frequent meals every 4-5 hours with lean proteins, complex carbs and healthy fats. Minimize simple  carbs  Disorder of bone and cartilage Vitamin D WNL, continue supplements and calcium tid, stay as active as tolerated  GERD Avoid offending foods, start probiotics. Do not eat large meals in late evening and consider raising head of bed.   HYPERLIPIDEMIA Tolerating statin, encouraged heart healthy diet, avoid trans fats, minimize simple carbs and saturated fats. Increase exercise as tolerated  Hypothyroidism On Levothyroxine, continue to monitor  HTN (hypertension) Well controlled, no changes to meds. Encouraged heart healthy diet such as the DASH diet and exercise as tolerated.   Diabetes mellitus type 2 in obese Follows with Dr Chalmers Cater, endocrinology, reports A1C consistently under 7 will continue to try and keep updating lab work

## 2015-06-29 NOTE — Assessment & Plan Note (Signed)
Avoid offending foods, start probiotics. Do not eat large meals in late evening and consider raising head of bed.  

## 2015-06-29 NOTE — Assessment & Plan Note (Signed)
Follows with Dr Chalmers Cater, endocrinology, reports A1C consistently under 7 will continue to try and keep updating lab work

## 2015-06-29 NOTE — Assessment & Plan Note (Signed)
On Levothyroxine, continue to monitor 

## 2015-06-29 NOTE — Assessment & Plan Note (Signed)
Has surgery scheduled with Dr Janace Hoard for excision and radical dissection of neck for SCC. Patient doing well but anxious to proceed

## 2015-07-03 NOTE — Pre-Procedure Instructions (Signed)
Amanda Carlson  07/03/2015     Your procedure is scheduled on: Friday July 11, 2015 at 12:00 PM.  Report [facility] Admitting at 10:00 A.M.  Call this number if you have problems the morning of surgery: [phone]   Remember:  Do not eat food or drink liquids after midnight.  Take these medicines the morning of surgery with A SIP OF WATER : Advair inhaler, Albuterol inhaler if needed, Alprazolam (Xanax) if needed, Baclofen (Lioresal) if needed, Cetirizine (Zyrtec) if needed, Flonase nasal spray, Levothyroxine (Synthroid), Tramadol (Ultram) if needed   Do NOT take any diabetic medications the morning of your surgery (Ex. Metformin/Glucophage)   Stop taking any vitamins, herbal medications, Krill Oil, Ibuprofen, Advil, Motrin, Aleve, etc   Please bring CPAP mask the day of your surgery   Do not wear jewelry, make-up or nail polish.  Do not wear lotions, powders, or perfumes.    Do not shave 48 hours prior to surgery.    Do not bring valuables to the hospital.  [facility] is not responsible for any belongings or valuables.  Contacts, dentures or bridgework may not be worn into surgery.  Leave your suitcase in the car.  After surgery it may be brought to your room.  For patients admitted to the hospital, discharge time will be determined by your treatment team.  Patients discharged the day of surgery will not be allowed to drive home.   Name and phone number of your driver:    Special instructions:  Shower using CHG soap the night before and the morning of your surgery  Please read over the following fact sheets that you were given. Pain Booklet, Coughing and Deep Breathing and Surgical Site Infection Prevention

## 2015-07-04 ENCOUNTER — Encounter (HOSPITAL_COMMUNITY): Payer: Self-pay

## 2015-07-04 ENCOUNTER — Encounter (HOSPITAL_COMMUNITY)
Admission: RE | Admit: 2015-07-04 | Discharge: 2015-07-04 | Disposition: A | Discharge status: Home / Self Care | Payer: Medicare Other | Source: Ambulatory Visit | Attending: Otolaryngology | Admitting: Otolaryngology

## 2015-07-04 DIAGNOSIS — E119 Type 2 diabetes mellitus without complications: Secondary | ICD-10-CM | POA: Diagnosis not present

## 2015-07-04 DIAGNOSIS — Z01818 Encounter for other preprocedural examination: Secondary | ICD-10-CM | POA: Diagnosis not present

## 2015-07-04 DIAGNOSIS — E669 Obesity, unspecified: Secondary | ICD-10-CM | POA: Diagnosis not present

## 2015-07-04 HISTORY — DX: Unspecified osteoarthritis, unspecified site: M19.90

## 2015-07-04 HISTORY — DX: Reserved for inherently not codable concepts without codable children: IMO0001

## 2015-07-04 HISTORY — DX: Anesthesia of skin: R20.2

## 2015-07-04 HISTORY — DX: Rheumatic fever without heart involvement: I00

## 2015-07-04 HISTORY — DX: Family history of other specified conditions: Z84.89

## 2015-07-04 HISTORY — DX: Dysphagia, unspecified: R13.10

## 2015-07-04 HISTORY — DX: Anesthesia of skin: R20.0

## 2015-07-04 HISTORY — DX: Bronchitis, not specified as acute or chronic: J40

## 2015-07-04 LAB — CBC
HCT: 39 % (ref 36.0–46.0)
Hemoglobin: 12.7 g/dL (ref 12.0–15.0)
MCH: 30 pg (ref 26.0–34.0)
MCHC: 32.6 g/dL (ref 30.0–36.0)
MCV: 92 fL (ref 78.0–100.0)
Platelets: 259 10*3/uL (ref 150–400)
RBC: 4.24 MIL/uL (ref 3.87–5.11)
RDW: 15.4 % (ref 11.5–15.5)
WBC: 8.6 10*3/uL (ref 4.0–10.5)

## 2015-07-04 LAB — BASIC METABOLIC PANEL
ANION GAP: 8 (ref 5–15)
BUN: 16 mg/dL (ref 6–20)
CO2: 25 mmol/L (ref 22–32)
CREATININE: 0.64 mg/dL (ref 0.44–1.00)
Calcium: 9.3 mg/dL (ref 8.9–10.3)
Chloride: 107 mmol/L (ref 101–111)
GFR calc Af Amer: >60 mL/min (ref >60)
GFR calc non Af Amer: >60 mL/min (ref >60)
GLUCOSE: 108 mg/dL — AB (ref 65–99)
Potassium: 4.1 mmol/L (ref 3.5–5.1)
Sodium: 140 mmol/L (ref 135–145)

## 2015-07-04 LAB — GLUCOSE, CAPILLARY: Glucose-Capillary: 96 mg/dL (ref 65–99)

## 2015-07-04 NOTE — Progress Notes (Signed)
PCP is Penni Homans. Patient denied having any acute cardiac or pulmonary issues, and informed Nurse that she last used Albuterol inhaler about a week ago. Patient informed Nurse that she has been having some difficulty swallowing solids and liquids, but informed Nurse that it was not a new symptom. Ebony Hail, [place] informed of this.  Nurse inquired about blood glucose and patient informed Nurse that the highest her blood sugar has been was 180, and the lowest was in the 70s. Patient informed Nurse that she has not had a A1C done within the last two months, therefore Nurse will order one today.

## 2015-07-05 LAB — HEMOGLOBIN A1C
HEMOGLOBIN A1C: 6.2 % — AB (ref 4.8–5.6)
Mean Plasma Glucose: 131 mg/dL

## 2015-07-11 ENCOUNTER — Ambulatory Visit (HOSPITAL_COMMUNITY): Payer: Medicare Other | Admitting: Anesthesiology

## 2015-07-11 ENCOUNTER — Ambulatory Visit (HOSPITAL_COMMUNITY): Payer: Medicare Other | Admitting: Vascular Surgery

## 2015-07-11 ENCOUNTER — Encounter (HOSPITAL_COMMUNITY): Payer: Self-pay | Admitting: *Deleted

## 2015-07-11 ENCOUNTER — Encounter (HOSPITAL_COMMUNITY)
Admission: AD | Disposition: A | Discharge status: Home / Self Care | Payer: Self-pay | Source: Ambulatory Visit | Attending: Otolaryngology

## 2015-07-11 ENCOUNTER — Inpatient Hospital Stay (HOSPITAL_COMMUNITY)
Admission: RE | Admit: 2015-07-11 | Discharge: 2015-07-14 | DRG: 130 | Disposition: A | Discharge status: Home / Self Care | Payer: Medicare Other | Source: Ambulatory Visit | Attending: Otolaryngology | Admitting: Otolaryngology

## 2015-07-11 DIAGNOSIS — M549 Dorsalgia, unspecified: Secondary | ICD-10-CM | POA: Diagnosis not present

## 2015-07-11 DIAGNOSIS — C029 Malignant neoplasm of tongue, unspecified: Secondary | ICD-10-CM | POA: Diagnosis not present

## 2015-07-11 DIAGNOSIS — J45909 Unspecified asthma, uncomplicated: Secondary | ICD-10-CM | POA: Diagnosis present

## 2015-07-11 DIAGNOSIS — K219 Gastro-esophageal reflux disease without esophagitis: Secondary | ICD-10-CM | POA: Diagnosis not present

## 2015-07-11 DIAGNOSIS — Z7982 Long term (current) use of aspirin: Secondary | ICD-10-CM

## 2015-07-11 DIAGNOSIS — C021 Malignant neoplasm of border of tongue: Secondary | ICD-10-CM | POA: Diagnosis not present

## 2015-07-11 DIAGNOSIS — E039 Hypothyroidism, unspecified: Secondary | ICD-10-CM | POA: Diagnosis not present

## 2015-07-11 DIAGNOSIS — E119 Type 2 diabetes mellitus without complications: Secondary | ICD-10-CM | POA: Diagnosis not present

## 2015-07-11 DIAGNOSIS — Z79899 Other long term (current) drug therapy: Secondary | ICD-10-CM

## 2015-07-11 DIAGNOSIS — G4730 Sleep apnea, unspecified: Secondary | ICD-10-CM | POA: Diagnosis present

## 2015-07-11 DIAGNOSIS — Z885 Allergy status to narcotic agent status: Secondary | ICD-10-CM

## 2015-07-11 DIAGNOSIS — Z981 Arthrodesis status: Secondary | ICD-10-CM

## 2015-07-11 DIAGNOSIS — K589 Irritable bowel syndrome without diarrhea: Secondary | ICD-10-CM | POA: Diagnosis present

## 2015-07-11 DIAGNOSIS — Z91048 Other nonmedicinal substance allergy status: Secondary | ICD-10-CM

## 2015-07-11 DIAGNOSIS — Z808 Family history of malignant neoplasm of other organs or systems: Secondary | ICD-10-CM

## 2015-07-11 HISTORY — PX: RADICAL NECK DISSECTION: SHX2284

## 2015-07-11 HISTORY — PX: EXCISION OF TONGUE LESION: SHX6434

## 2015-07-11 LAB — GLUCOSE, CAPILLARY
GLUCOSE-CAPILLARY: 153 mg/dL — AB (ref 65–99)
Glucose-Capillary: 129 mg/dL — ABNORMAL HIGH (ref 65–99)
Glucose-Capillary: 197 mg/dL — ABNORMAL HIGH (ref 65–99)

## 2015-07-11 LAB — CBC
HCT: 36.4 % (ref 36.0–46.0)
Hemoglobin: 11.9 g/dL — ABNORMAL LOW (ref 12.0–15.0)
MCH: 29.6 pg (ref 26.0–34.0)
MCHC: 32.7 g/dL (ref 30.0–36.0)
MCV: 90.5 fL (ref 78.0–100.0)
PLATELETS: 208 10*3/uL (ref 150–400)
RBC: 4.02 MIL/uL (ref 3.87–5.11)
RDW: 15 % (ref 11.5–15.5)
WBC: 11 10*3/uL — AB (ref 4.0–10.5)

## 2015-07-11 LAB — CREATININE, SERUM
CREATININE: 0.7 mg/dL (ref 0.44–1.00)
GFR calc Af Amer: >60 mL/min (ref >60)
GFR calc non Af Amer: >60 mL/min (ref >60)

## 2015-07-11 SURGERY — DISSECTION, NECK, RADICAL
Anesthesia: General | Site: Neck | Laterality: Left

## 2015-07-11 MED ORDER — EPHEDRINE SULFATE 50 MG/ML IJ SOLN
INTRAMUSCULAR | Status: DC | PRN
  Administered 2015-07-11 (×2): 10 mg via INTRAVENOUS

## 2015-07-11 MED ORDER — ONDANSETRON HCL 4 MG/2ML IJ SOLN
INTRAMUSCULAR | Status: AC
  Filled 2015-07-11: qty 2

## 2015-07-11 MED ORDER — ALBUTEROL SULFATE HFA 108 (90 BASE) MCG/ACT IN AERS: 2.0000 | INHALATION_SPRAY | Freq: Four times a day (QID) | RESPIRATORY_TRACT | Status: DC | PRN

## 2015-07-11 MED ORDER — PROMETHAZINE HCL 25 MG/ML IJ SOLN
6.2500 mg | INTRAMUSCULAR | Status: DC | PRN
  Administered 2015-07-11: 6.25 mg via INTRAVENOUS

## 2015-07-11 MED ORDER — NEOSTIGMINE METHYLSULFATE 10 MG/10ML IV SOLN
INTRAVENOUS | Status: AC
  Filled 2015-07-11: qty 1

## 2015-07-11 MED ORDER — INSULIN ASPART 100 UNIT/ML ~~LOC~~ SOLN
0.0000 [IU] | Freq: Three times a day (TID) | SUBCUTANEOUS | Status: DC
Start: 2015-07-11 — End: 2015-07-14
  Administered 2015-07-12 – 2015-07-14 (×4): 1 [IU] via SUBCUTANEOUS

## 2015-07-11 MED ORDER — NEOSTIGMINE METHYLSULFATE 10 MG/10ML IV SOLN
INTRAVENOUS | Status: DC | PRN
  Administered 2015-07-11: 4 mg via INTRAVENOUS

## 2015-07-11 MED ORDER — OXYCODONE HCL 5 MG PO TABS: 5.0000 mg | ORAL_TABLET | Freq: Once | ORAL | Status: DC | PRN

## 2015-07-11 MED ORDER — MIDAZOLAM HCL 5 MG/5ML IJ SOLN
INTRAMUSCULAR | Status: DC | PRN
  Administered 2015-07-11 (×2): 1 mg via INTRAVENOUS

## 2015-07-11 MED ORDER — BACITRACIN ZINC 500 UNIT/GM EX OINT
TOPICAL_OINTMENT | CUTANEOUS | Status: DC | PRN
  Administered 2015-07-11: 1 via TOPICAL

## 2015-07-11 MED ORDER — FUROSEMIDE 20 MG PO TABS: 20.0000 mg | ORAL_TABLET | Freq: Every day | ORAL | Status: DC | PRN

## 2015-07-11 MED ORDER — LEVOTHYROXINE SODIUM 100 MCG PO TABS
100.0000 ug | ORAL_TABLET | Freq: Every day | ORAL | Status: DC
  Administered 2015-07-12 – 2015-07-14 (×3): 100 ug via ORAL
  Filled 2015-07-11 (×4): qty 1

## 2015-07-11 MED ORDER — MIDAZOLAM HCL 2 MG/2ML IJ SOLN
INTRAMUSCULAR | Status: AC
  Filled 2015-07-11: qty 2

## 2015-07-11 MED ORDER — ATORVASTATIN CALCIUM 40 MG PO TABS
40.0000 mg | ORAL_TABLET | Freq: Every day | ORAL | Status: DC
  Administered 2015-07-11 – 2015-07-13 (×3): 40 mg via ORAL
  Filled 2015-07-11 (×3): qty 1

## 2015-07-11 MED ORDER — CLINDAMYCIN PHOSPHATE 600 MG/50ML IV SOLN
600.0000 mg | INTRAVENOUS | Status: DC
  Filled 2015-07-11: qty 50

## 2015-07-11 MED ORDER — HYDROMORPHONE HCL 1 MG/ML IJ SOLN: 0.5000 mg | INTRAMUSCULAR | Status: DC | PRN

## 2015-07-11 MED ORDER — LIDOCAINE HCL (CARDIAC) 20 MG/ML IV SOLN
INTRAVENOUS | Status: DC | PRN
  Administered 2015-07-11: 80 mg via INTRAVENOUS

## 2015-07-11 MED ORDER — METFORMIN HCL ER 500 MG PO TB24
500.0000 mg | ORAL_TABLET | Freq: Two times a day (BID) | ORAL | Status: DC
  Administered 2015-07-12 – 2015-07-14 (×5): 500 mg via ORAL
  Filled 2015-07-11 (×5): qty 1

## 2015-07-11 MED ORDER — HYDROMORPHONE HCL 1 MG/ML IJ SOLN
0.2500 mg | INTRAMUSCULAR | Status: DC | PRN
  Administered 2015-07-11 (×2): 0.25 mg via INTRAVENOUS

## 2015-07-11 MED ORDER — ALPRAZOLAM 0.25 MG PO TABS
0.2500 mg | ORAL_TABLET | Freq: Every evening | ORAL | Status: DC | PRN
  Administered 2015-07-14: 0.25 mg via ORAL
  Filled 2015-07-11 (×2): qty 1

## 2015-07-11 MED ORDER — ALBUTEROL SULFATE (2.5 MG/3ML) 0.083% IN NEBU: 2.5000 mg | INHALATION_SOLUTION | Freq: Four times a day (QID) | RESPIRATORY_TRACT | Status: DC | PRN

## 2015-07-11 MED ORDER — ONDANSETRON HCL 4 MG/2ML IJ SOLN: 4.0000 mg | Freq: Four times a day (QID) | INTRAMUSCULAR | Status: DC | PRN

## 2015-07-11 MED ORDER — BACITRACIN ZINC 500 UNIT/GM EX OINT
TOPICAL_OINTMENT | CUTANEOUS | Status: AC
  Filled 2015-07-11: qty 28.35

## 2015-07-11 MED ORDER — ZOLPIDEM TARTRATE 5 MG PO TABS: 5.0000 mg | ORAL_TABLET | Freq: Every evening | ORAL | Status: DC | PRN

## 2015-07-11 MED ORDER — DEXAMETHASONE SODIUM PHOSPHATE 10 MG/ML IJ SOLN
INTRAMUSCULAR | Status: DC | PRN
  Administered 2015-07-11: 10 mg via INTRAVENOUS

## 2015-07-11 MED ORDER — GLYCOPYRROLATE 0.2 MG/ML IJ SOLN
INTRAMUSCULAR | Status: AC
  Filled 2015-07-11: qty 4

## 2015-07-11 MED ORDER — ROCURONIUM BROMIDE 50 MG/5ML IV SOLN
INTRAVENOUS | Status: AC
  Filled 2015-07-11: qty 1

## 2015-07-11 MED ORDER — LIDOCAINE HCL (CARDIAC) 20 MG/ML IV SOLN
INTRAVENOUS | Status: AC
  Filled 2015-07-11: qty 5

## 2015-07-11 MED ORDER — PHENYLEPHRINE HCL 10 MG/ML IJ SOLN
10.0000 mg | INTRAVENOUS | Status: DC | PRN
  Administered 2015-07-11: 20 ug/min via INTRAVENOUS

## 2015-07-11 MED ORDER — VITAMIN D (ERGOCALCIFEROL) 1.25 MG (50000 UNIT) PO CAPS
50000.0000 [IU] | ORAL_CAPSULE | ORAL | Status: DC
  Filled 2015-07-11: qty 1

## 2015-07-11 MED ORDER — LIDOCAINE-EPINEPHRINE 1 %-1:100000 IJ SOLN
INTRAMUSCULAR | Status: AC
  Filled 2015-07-11: qty 1

## 2015-07-11 MED ORDER — ONDANSETRON HCL 4 MG/2ML IJ SOLN
INTRAMUSCULAR | Status: DC | PRN
Start: 2015-07-11 — End: 2015-07-11
  Administered 2015-07-11: 4 mg via INTRAVENOUS

## 2015-07-11 MED ORDER — GLYCOPYRROLATE 0.2 MG/ML IJ SOLN
INTRAMUSCULAR | Status: AC
  Filled 2015-07-11: qty 3

## 2015-07-11 MED ORDER — GLYCOPYRROLATE 0.2 MG/ML IJ SOLN
INTRAMUSCULAR | Status: DC | PRN
  Administered 2015-07-11: 0.6 mg via INTRAVENOUS

## 2015-07-11 MED ORDER — DEXTROSE-NACL 5-0.45 % IV SOLN
INTRAVENOUS | Status: DC
  Administered 2015-07-11 – 2015-07-13 (×7): via INTRAVENOUS

## 2015-07-11 MED ORDER — FENTANYL CITRATE (PF) 250 MCG/5ML IJ SOLN
INTRAMUSCULAR | Status: AC
  Filled 2015-07-11: qty 5

## 2015-07-11 MED ORDER — 0.9 % SODIUM CHLORIDE (POUR BTL) OPTIME
TOPICAL | Status: DC | PRN
  Administered 2015-07-11: 1000 mL

## 2015-07-11 MED ORDER — ASPIRIN EC 81 MG PO TBEC
81.0000 mg | DELAYED_RELEASE_TABLET | Freq: Every day | ORAL | Status: DC
  Administered 2015-07-12 – 2015-07-14 (×3): 81 mg via ORAL
  Filled 2015-07-11 (×3): qty 1

## 2015-07-11 MED ORDER — PROPOFOL 10 MG/ML IV BOLUS
INTRAVENOUS | Status: AC
  Filled 2015-07-11: qty 20

## 2015-07-11 MED ORDER — ROCURONIUM BROMIDE 100 MG/10ML IV SOLN
INTRAVENOUS | Status: DC | PRN
Start: 2015-07-11 — End: 2015-07-11
  Administered 2015-07-11: 30 mg via INTRAVENOUS
  Administered 2015-07-11: 20 mg via INTRAVENOUS

## 2015-07-11 MED ORDER — PROPOFOL 10 MG/ML IV BOLUS
INTRAVENOUS | Status: DC | PRN
Start: 2015-07-11 — End: 2015-07-11
  Administered 2015-07-11: 150 mg via INTRAVENOUS

## 2015-07-11 MED ORDER — MOMETASONE FURO-FORMOTEROL FUM 100-5 MCG/ACT IN AERO
2.0000 | INHALATION_SPRAY | Freq: Two times a day (BID) | RESPIRATORY_TRACT | Status: DC
  Administered 2015-07-11 – 2015-07-13 (×5): 2 via RESPIRATORY_TRACT
  Filled 2015-07-11: qty 8.8

## 2015-07-11 MED ORDER — LACTATED RINGERS IV SOLN
INTRAVENOUS | Status: DC
  Administered 2015-07-11 (×2): via INTRAVENOUS

## 2015-07-11 MED ORDER — CLINDAMYCIN PHOSPHATE 600 MG/50ML IV SOLN
600.0000 mg | Freq: Three times a day (TID) | INTRAVENOUS | Status: DC
  Administered 2015-07-11 – 2015-07-14 (×9): 600 mg via INTRAVENOUS
  Filled 2015-07-11 (×11): qty 50

## 2015-07-11 MED ORDER — CLINDAMYCIN PHOSPHATE 600 MG/50ML IV SOLN
INTRAVENOUS | Status: DC | PRN
  Administered 2015-07-11: 600 mg via INTRAVENOUS

## 2015-07-11 MED ORDER — HYDROCODONE-ACETAMINOPHEN 5-325 MG PO TABS
1.0000 | ORAL_TABLET | ORAL | Status: DC | PRN
  Administered 2015-07-11 – 2015-07-14 (×12): 2 via ORAL
  Filled 2015-07-11 (×13): qty 2

## 2015-07-11 MED ORDER — SUCCINYLCHOLINE CHLORIDE 20 MG/ML IJ SOLN
INTRAMUSCULAR | Status: DC | PRN
  Administered 2015-07-11: 100 mg via INTRAVENOUS

## 2015-07-11 MED ORDER — FENTANYL CITRATE (PF) 100 MCG/2ML IJ SOLN
INTRAMUSCULAR | Status: DC | PRN
  Administered 2015-07-11: 100 ug via INTRAVENOUS
  Administered 2015-07-11 (×2): 50 ug via INTRAVENOUS
  Administered 2015-07-11: 100 ug via INTRAVENOUS
  Administered 2015-07-11 (×4): 50 ug via INTRAVENOUS

## 2015-07-11 MED ORDER — OXYCODONE HCL 5 MG/5ML PO SOLN: 5.0000 mg | Freq: Once | ORAL | Status: DC | PRN

## 2015-07-11 MED ORDER — DEXAMETHASONE SODIUM PHOSPHATE 4 MG/ML IJ SOLN
INTRAMUSCULAR | Status: AC
  Filled 2015-07-11: qty 2

## 2015-07-11 MED ORDER — DEXAMETHASONE SODIUM PHOSPHATE 10 MG/ML IJ SOLN
INTRAMUSCULAR | Status: AC
  Filled 2015-07-11: qty 1

## 2015-07-11 MED ORDER — NEOSTIGMINE METHYLSULFATE 10 MG/10ML IV SOLN
INTRAVENOUS | Status: AC
  Filled 2015-07-11: qty 2

## 2015-07-11 MED ORDER — HEPARIN SODIUM (PORCINE) 5000 UNIT/ML IJ SOLN
5000.0000 [IU] | Freq: Three times a day (TID) | INTRAMUSCULAR | Status: DC
  Administered 2015-07-11 – 2015-07-14 (×8): 5000 [IU] via SUBCUTANEOUS
  Filled 2015-07-11 (×6): qty 1

## 2015-07-11 MED ORDER — HYDROMORPHONE HCL 1 MG/ML IJ SOLN
INTRAMUSCULAR | Status: AC
  Administered 2015-07-11: 0.25 mg via INTRAVENOUS
  Filled 2015-07-11: qty 1

## 2015-07-11 MED ORDER — PROMETHAZINE HCL 25 MG/ML IJ SOLN
INTRAMUSCULAR | Status: AC
  Administered 2015-07-11: 6.25 mg via INTRAVENOUS
  Filled 2015-07-11: qty 1

## 2015-07-11 SURGICAL SUPPLY — 57 items
ATTRACTOMAT 16X20 MAGNETIC DRP (DRAPES) IMPLANT
BLADE SURG 15 STRL LF DISP TIS (BLADE) IMPLANT
BLADE SURG 15 STRL SS (BLADE) ×3
CANISTER SUCTION 2500CC (MISCELLANEOUS) ×3 IMPLANT
CLEANER TIP ELECTROSURG 2X2 (MISCELLANEOUS) ×4 IMPLANT
COVER SURGICAL LIGHT HANDLE (MISCELLANEOUS) ×3 IMPLANT
CRADLE DONUT ADULT HEAD (MISCELLANEOUS) IMPLANT
DRAIN CHANNEL 15F RND FF W/TCR (WOUND CARE) ×1 IMPLANT
DRAIN HEMOVAC 1/8 X 5 (WOUND CARE) IMPLANT
DRAIN JACKSON PRATT 10MM FLAT (MISCELLANEOUS) IMPLANT
DRAPE ORTHO SPLIT 77X108 STRL (DRAPES) ×3
DRAPE PROXIMA HALF (DRAPES) ×1 IMPLANT
DRAPE SURG ORHT 6 SPLT 77X108 (DRAPES) IMPLANT
ELECT COATED BLADE 2.86 ST (ELECTRODE) ×4 IMPLANT
ELECT REM PT RETURN 9FT ADLT (ELECTROSURGICAL) ×3
ELECTRODE REM PT RTRN 9FT ADLT (ELECTROSURGICAL) ×2 IMPLANT
EVACUATOR SILICONE 100CC (DRAIN) ×3 IMPLANT
GAUZE SPONGE 4X4 16PLY XRAY LF (GAUZE/BANDAGES/DRESSINGS) IMPLANT
GLOVE BIO SURGEON STRL SZ7.5 (GLOVE) ×3 IMPLANT
GLOVE BIOGEL PI ORTHO PRO SZ7 (GLOVE) ×1
GLOVE ECLIPSE 7.5 STRL STRAW (GLOVE) ×4 IMPLANT
GLOVE PI ORTHO PRO STRL SZ7 (GLOVE) IMPLANT
GLOVE SURG SS PI 6.5 STRL IVOR (GLOVE) ×1 IMPLANT
GOWN STRL REUS W/ TWL LRG LVL3 (GOWN DISPOSABLE) ×4 IMPLANT
GOWN STRL REUS W/TWL LRG LVL3 (GOWN DISPOSABLE) ×15
KIT BASIN OR (CUSTOM PROCEDURE TRAY) ×3 IMPLANT
KIT ROOM TURNOVER OR (KITS) ×3 IMPLANT
LOCATOR NERVE 3 VOLT (DISPOSABLE) ×1 IMPLANT
NDL HYPO 25GX1X1/2 BEV (NEEDLE) IMPLANT
NEEDLE HYPO 25GX1X1/2 BEV (NEEDLE) IMPLANT
NS IRRIG 1000ML POUR BTL (IV SOLUTION) ×3 IMPLANT
PAD ARMBOARD 7.5X6 YLW CONV (MISCELLANEOUS) ×6 IMPLANT
PENCIL FOOT CONTROL (ELECTRODE) ×4 IMPLANT
SHEARS HARMONIC 9CM CVD (BLADE) ×3 IMPLANT
SPONGE INTESTINAL PEANUT (DISPOSABLE) ×1 IMPLANT
SPONGE LAP 18X18 X RAY DECT (DISPOSABLE) ×3 IMPLANT
STAPLER VISISTAT 35W (STAPLE) ×3 IMPLANT
SUT CHROMIC 3 0 SH 27 (SUTURE) ×3 IMPLANT
SUT CHROMIC 4 0 PS 2 18 (SUTURE) ×5 IMPLANT
SUT CHROMIC 5 0 P 3 (SUTURE) IMPLANT
SUT ETHIBOND 3 0 SH (SUTURE) IMPLANT
SUT ETHILON 2 0 FS 18 (SUTURE) ×1 IMPLANT
SUT ETHILON 5 0 PS 2 18 (SUTURE) IMPLANT
SUT SILK 2 0 (SUTURE) ×3
SUT SILK 2 0 SH CR/8 (SUTURE) ×3 IMPLANT
SUT SILK 2-0 18XBRD TIE 12 (SUTURE) ×2 IMPLANT
SUT SILK 3 0 TIES 17X18 (SUTURE) ×3
SUT SILK 3-0 18XBRD TIE BLK (SUTURE) ×2 IMPLANT
SUT SILK 4 0 (SUTURE) ×6
SUT SILK 4-0 18XBRD TIE 12 (SUTURE) ×2 IMPLANT
SUT VIC AB 3-0 FS2 27 (SUTURE) IMPLANT
SUT VIC AB 3-0 PS2 18 (SUTURE) ×9
SUT VIC AB 3-0 PS2 18XBRD (SUTURE) IMPLANT
SUT VIC AB 3-0 SH 18 (SUTURE) IMPLANT
TRAY ENT MC OR (CUSTOM PROCEDURE TRAY) ×3 IMPLANT
TRAY FOLEY CATH 16FR SILVER (SET/KITS/TRAYS/PACK) ×2 IMPLANT
TUBE FEEDING 10FR FLEXIFLO (MISCELLANEOUS) IMPLANT

## 2015-07-11 NOTE — Progress Notes (Signed)
Patient was provided with hospital CPAP machine and assisted in placement of her nasal mask from home.  Settings, per patient, are 10 cmH20 with humidity.  2L oxygen bleed in.  Patient is familiar with equipment and procedure.

## 2015-07-11 NOTE — Transfer of Care (Signed)
Immediate Anesthesia Transfer of Care Note  Patient: Amanda Carlson  Procedure(s) Performed: Procedure(s) with comments: RADICAL NECK DISSECTION (Left) - Left neck dissection and left tongue resection EXCISION OF TONGUE LESION (Left)  Patient Location: PACU  Anesthesia Type:General  Level of Consciousness: awake, alert  and pateint uncooperative  Airway & Oxygen Therapy: Patient Spontanous Breathing and Patient connected to face mask oxygen  Post-op Assessment: Report given to RN, Post -op Vital signs reviewed and stable and Patient moving all extremities X 4  Post vital signs: Reviewed and stable  Last Vitals:  Filed Vitals:   07/11/15 1536  BP: 134/62  Pulse: 86  Temp: 36.2 C  Resp: 16    Complications: No apparent anesthesia complications

## 2015-07-11 NOTE — Anesthesia Preprocedure Evaluation (Addendum)
Anesthesia Evaluation  Patient identified by MRN, date of birth, ID band Patient awake    Reviewed: Allergy & Precautions, NPO status , Patient's Chart, lab work & pertinent test results  Airway Mallampati: III  TM Distance: <3 FB Neck ROM: Full    Dental   Pulmonary shortness of breath and with exertion, asthma , sleep apnea ,  breath sounds clear to auscultation        Cardiovascular hypertension, Pt. on medications Rhythm:Regular Rate:Normal     Neuro/Psych  Neuromuscular disease    GI/Hepatic Neg liver ROS, GERD-  ,  Endo/Other  diabetes, Type 2, Oral Hypoglycemic AgentsHypothyroidism Morbid obesity  Renal/GU negative Renal ROS     Musculoskeletal  (+) Arthritis -,   Abdominal   Peds  Hematology negative hematology ROS (+)   Anesthesia Other Findings   Reproductive/Obstetrics                            Anesthesia Physical Anesthesia Plan  ASA: III  Anesthesia Plan: General   Post-op Pain Management:    Induction: Intravenous  Airway Management Planned: Oral ETT  Additional Equipment:   Intra-op Plan:   Post-operative Plan: Extubation in OR  Informed Consent: I have reviewed the patients History and Physical, chart, labs and discussed the procedure including the risks, benefits and alternatives for the proposed anesthesia with the patient or authorized representative who has indicated his/her understanding and acceptance.   Dental advisory given  Plan Discussed with: CRNA  Anesthesia Plan Comments:         Anesthesia Quick Evaluation

## 2015-07-11 NOTE — Op Note (Signed)
Preop/postop diagnosis: Left squamous cell carcinoma of the lateral tongue Procedure: Excision of left tongue lesion and left modified radical neck dissection Anesthesia: Gen. Estimated blood loss: Approximately 50 mL Asst.: Dr. Melida Quitter Indications: 69 year old with a diagnosis of squamous cell carcinoma of the left tongue. She has had a CT scan workup and had a direct laryngoscopy with base of tongue biopsies all negative but the lateral mobile tongue positive. She is now ready to proceed with the hemi-glossectomy and neck dissection. Risks, benefits, and options were discussed and all questions are answered and consent was obtained. Procedure: Patient was taken to the operating room placed in the supine position after general endotracheal tube anesthesia was examined of the mouth and draped in the usual sterile manner. A talc clip was placed to the tip of the tongue and the tongue was retracted. The lesion was easily identified in the lateral tongue and palpated. There was no rinse that it extended anywhere other than the location of visualization. A incision was then made around this and a circumferential fashion with electrocautery and dissection was carried out removing the lesion with a centimeter and a half margin around the lesion in all directions and a same into the tongue muscle. This was marked and sent for frozen section and it was cell carcinoma with margins clear. It was then irrigated and closed with interrupted 30 Vicryls. Patient was then positioned for neck dissection prepped and draped in the usual sterile manner the apron type incision was fashioned and electrocautery was used to open the skin. The superior and inferior subplatysmal flaps were elevated. Dissection was started by first finding the marginal mandibular nerve which appeared to be high and along the mandible and dissection was carried out below that. The dissection was carried into the sub-mandibular space where the  mylohyoid was identified. The dissection was carried across the in detail the parotid. The sternocleidomastoid muscle was dissected in the fascia was removed from the gutter as the spinal accessory nerve was identified and preserved. Was dissected up to the digastric. The fascia was then brought off the jugular vein preserving the jugular carotid and vagus nerve. The dissection was then carried into the submandibular triangle Gann where the lingual nerve was identified and the ganglion was divided nerve preserved. The hypoglossal nerve was identified and preserved. The duct was divided of the submandibular gland and the gland was brought out of the space. The further dissection of the fascia off of the strap muscles and the specimen then was then sent marked. She was then irrigated with saline. A #15 drain was placed and secured with 3-0 nylon and the site flaps were closed with interrupted 4-0 chromic. Skin staples used to close the skin. Patient was then awakened checked for any tongue swelling which she did not have any significant swelling of the tongue brought to recovery room in stable condition counts were correct

## 2015-07-11 NOTE — Progress Notes (Signed)
Patient ID: Amanda Carlson, female   DOB: 29-Jun-1946, 69 y.o.   MRN: [id number] Post op Check  Awake and alert, sitting up in bed in PACU. No breathing difficulty, complains of pain, about to get pain medicine.   JP in place and holding a seal. Incision intact without swelling. Oral closure intact, without hematoma.   Stable post op. Continue in patient care.

## 2015-07-11 NOTE — H&P (Signed)
Amanda Carlson is an 69 y.o. female.   Chief Complaint: tongue cancer HPI: hx of left SCCA of tongue and now for excision and neck dissection  Past Medical History  Diagnosis Date  . Knee pain, right 10/16/2011  . Knee pain, bilateral 10/16/2011  . HYPOTHYROIDISM 01/22/2011    Qualifier: Diagnosis of  By: Charlett Blake MD, Boyce Medici with Dr Chalmers Cater   . Overweight(278.02) 01/22/2011    Qualifier: Diagnosis of  By: Charlett Blake MD, Erline Levine    . Intrinsic asthma, unspecified 01/22/2011    Qualifier: Diagnosis of  By: Charlett Blake MD, Erline Levine    . THYROID CYST 01/22/2011    Qualifier: Diagnosis of  By: Charlett Blake MD, Erline Levine    . ALLERGIC RHINITIS, SEASONAL 01/22/2011    Qualifier: Diagnosis of  By: Charlett Blake MD, Erline Levine    . GERD 01/22/2011    Qualifier: Diagnosis of  By: Charlett Blake MD, Erline Levine    . Irritable bowel syndrome 01/22/2011    Qualifier: Diagnosis of  By: Charlett Blake MD, Erline Levine    . CTS (carpal tunnel syndrome) 06/07/2012  . Tinea corporis 06/07/2012  . Insomnia 06/07/2012  . Low back pain 10/04/2012  . Tongue lesion 01/18/2014  . Lesion of breast 01/18/2014    Calcified Right side? Follows at [facility]  . Cervical cancer screening 04/11/2014  . Medicare annual wellness visit, subsequent 04/14/2014  . Family history of adverse reaction to anesthesia     Pts mother has a difficult time waking up after anesthesia  . Shortness of breath dyspnea     with ambulation  . Rheumatic fever     as a child  . Bronchitis     hx of  . Diabetes mellitus without complication   . Diabetes mellitus type 2 in obese 06/29/2015  . Sleep apnea     wears CPAP machine  . Arthritis   . Difficulty swallowing     Liquids and foods at times  . Numbness and tingling in hands     Past Surgical History  Procedure Laterality Date  . Knee arthroscopy      right  . Cholecystectomy    . Tubal ligation    . Cervical fusion, discectomy, metal plate and 2 screws in place    . Thyroidectomy, partial      Family History  Problem Relation Age of Onset   . Atrial fibrillation Mother   . Other Mother     Back pain, CHF  . Arthritis Mother   . Osteoporosis Mother   . Heart disease Father   . Hypertension Father   . Diabetes Father   . Sleep apnea Father   . Atrial fibrillation Father     paroxysmal  . Cancer Father     skin cancers on face  . Diabetes Brother   . Diabetes Maternal Aunt     X 2 aunts  . Cancer Maternal Grandmother   . Diabetes Maternal Grandfather   . Stroke Paternal Grandmother   . Heart disease Paternal Grandfather   . Stroke Paternal Grandfather    Social History:  reports that she has never smoked. She has never used smokeless tobacco. She reports that she does not drink alcohol or use illicit drugs.  Allergies:  Allergies  Allergen Reactions  . Codeine Itching  . Tetanus Toxoid Swelling    Please do not give tetanus shot  . Adhesive [Tape] Rash    Please use paper tape    Medications Prior to Admission  Medication Sig Dispense Refill  .  albuterol (PROVENTIL HFA;VENTOLIN HFA) 108 (90 BASE) MCG/ACT inhaler Inhale 2 puffs into the lungs every 6 (six) hours as needed for wheezing. (Patient taking differently: Inhale 2 puffs into the lungs every 4 (four) hours as needed for wheezing or shortness of breath. ) 1 Inhaler 1  . ALPRAZolam (XANAX) 0.25 MG tablet TAKE 1 TABLET BY MOUTH EVERY DAY AS NEEDED FOR ANXIETY OR SLEEP 30 tablet 1  . Ascorbic Acid (VITAMIN C) 1000 MG tablet Take 2,000 mg by mouth daily.    Marland Kitchen aspirin EC 81 MG tablet Take 81 mg by mouth daily.    . baclofen (LIORESAL) 20 MG tablet TAKE 1 TABLET BY MOUTH 4 TIMES A DAY AS NEEDED FOR MUSCLE SPASMS 120 tablet 1  . Biotin 5000 MCG CAPS Take 5,000 mcg by mouth daily.    . cetirizine (ZYRTEC) 10 MG tablet Take 10 mg by mouth daily.     . Cholecalciferol (VITAMIN D) 1000 UNITS capsule Take 2,000 Units by mouth daily.     . fluticasone (FLONASE) 50 MCG/ACT nasal spray Place 2 sprays into both nostrils daily as needed for allergies or rhinitis.  (Patient taking differently: Place 2 sprays into both nostrils at bedtime. ) 16 g 6  . Fluticasone-Salmeterol (ADVAIR) 250-50 MCG/DOSE AEPB Inhale 1 puff into the lungs every 12 (twelve) hours. 60 each 3  . furosemide (LASIX) 20 MG tablet Take 1 tablet (20 mg total) by mouth daily as needed for fluid. (Patient taking differently: Take 20 mg by mouth daily as needed for fluid or edema. ) 30 tablet 6  . levothyroxine (SYNTHROID, LEVOTHROID) 100 MCG tablet Take 100 mcg by mouth daily before breakfast.    . loperamide (IMODIUM A-D) 2 MG tablet Take 2 mg by mouth 2 (two) times daily as needed for diarrhea or loose stools.    . metFORMIN (GLUCOPHAGE-XR) 500 MG 24 hr tablet Take 500 mg by mouth 2 (two) times daily.     . simvastatin (ZOCOR) 80 MG tablet Take 80 mg by mouth at bedtime.     . traMADol (ULTRAM) 50 MG tablet TAKE 1 TABLET BY MOUTH EVERY 6 HOURS AS NEEDED FOR MODERATE-SEVERE PAIN (Patient taking differently: Take 50 mg by mouth every 4 (four) hours as needed for moderate pain or severe pain. ) 120 tablet 0  . Vitamin D, Ergocalciferol, (DRISDOL) 50000 UNITS CAPS capsule Take 50,000 Units by mouth every 7 (seven) days. Mondays    . vitamin E 1000 UNIT capsule Take 3,000 Units by mouth daily.    Marland Kitchen zolpidem (AMBIEN) 10 MG tablet Take 0.5-1 tablets (5-10 mg total) by mouth at bedtime as needed for sleep. (Patient taking differently: Take 10 mg by mouth at bedtime. ) 30 tablet 5  . ADVAIR DISKUS 250-50 MCG/DOSE AEPB INAHLE 1 PUFF INTO LUNGS EVERY 12 HOURS (Patient not taking: Reported on 07/04/2015) 60 each 3  . amoxicillin (AMOXIL) 500 MG tablet Take 2,000 mg by mouth See admin instructions. Take 4 tablets (2000 mg) one hour before dental appointment    . zoster vaccine live, PF, (ZOSTAVAX) 40973 UNT/0.65ML injection Inject 19,400 Units into the skin once. (Patient not taking: Reported on 07/04/2015) 1 each 0    Results for orders placed or performed during the hospital encounter of 07/11/15 (from the  past 48 hour(s))  Glucose, capillary     Status: Abnormal   Collection Time: 07/11/15  9:54 AM  Result Value Ref Range   Glucose-Capillary 129 (H) 65 - 99 mg/dL   No  results found.  Review of Systems  Constitutional: Negative.   HENT: Negative.   Eyes: Negative.   Respiratory: Negative.   Cardiovascular: Negative.   Skin: Negative.     Blood pressure 127/68, pulse 70, temperature 97.7 F (36.5 C), temperature source Oral, resp. rate 18, height 5\' 6"  (1.676 m), weight 90.538 kg (199 lb 9.6 oz), SpO2 99 %. Physical Exam  Constitutional: She appears well-developed and well-nourished.  HENT:  Head: Normocephalic and atraumatic.  Nose: Nose normal.  Eyes: Conjunctivae are normal. Pupils are equal, round, and reactive to light.  Neck: Normal range of motion. Neck supple.  Cardiovascular: Normal rate.   Respiratory: Effort normal.  GI: Soft.  Musculoskeletal: Normal range of motion.     Assessment/Plan Left tongue cancer- she has SCCA and ready to proceed with excision and neck.   Melissa Montane 07/11/2015, 11:28 AM

## 2015-07-11 NOTE — Anesthesia Postprocedure Evaluation (Signed)
  Anesthesia Post-op Note  Patient: Amanda Carlson  Procedure(s) Performed: Procedure(s) with comments: RADICAL NECK DISSECTION (Left) - Left neck dissection and left tongue resection EXCISION OF TONGUE LESION (Left)  Patient Location: PACU  Anesthesia Type:General  Level of Consciousness: awake and alert   Airway and Oxygen Therapy: Patient Spontanous Breathing  Post-op Pain: mild  Post-op Assessment: Post-op Vital signs reviewed              Post-op Vital Signs: stable  Last Vitals:  Filed Vitals:   07/11/15 1630  BP: 143/70  Pulse: 79  Temp:   Resp: 20    Complications: No apparent anesthesia complications

## 2015-07-11 NOTE — Anesthesia Procedure Notes (Signed)
Procedure Name: Intubation Date/Time: 07/11/2015 12:24 PM Performed by: Sampson Si E Pre-anesthesia Checklist: Patient identified, Timeout performed, Emergency Drugs available, Suction available and Patient being monitored Patient Re-evaluated:Patient Re-evaluated prior to inductionOxygen Delivery Method: Circle system utilized Preoxygenation: Pre-oxygenation with 100% oxygen Intubation Type: IV induction Ventilation: Mask ventilation with difficulty Laryngoscope Size: Mac and 3 Grade View: Grade I Tube type: Oral Tube size: 7.0 mm Number of attempts: 1 Airway Equipment and Method: Video-laryngoscopy Placement Confirmation: ETT inserted through vocal cords under direct vision,  positive ETCO2 and breath sounds checked- equal and bilateral Secured at: 21 cm Tube secured with: Tape Dental Injury: Teeth and Oropharynx as per pre-operative assessment  Difficulty Due To: Difficulty was anticipated

## 2015-07-12 LAB — GLUCOSE, CAPILLARY
GLUCOSE-CAPILLARY: 96 mg/dL (ref 65–99)
Glucose-Capillary: 148 mg/dL — ABNORMAL HIGH (ref 65–99)
Glucose-Capillary: 126 mg/dL — ABNORMAL HIGH (ref 65–99)
Glucose-Capillary: 131 mg/dL — ABNORMAL HIGH (ref 65–99)

## 2015-07-12 NOTE — Progress Notes (Signed)
Patient ID: Amanda Carlson, female   DOB: 1946/06/23, 69 y.o.   MRN: [id number] Subjective: Feeling much better today, much less pain. She is able to drink small amounts.  Objective: Vital signs in last 24 hours: Temp:  [97.2 F (36.2 C)-98.2 F (36.8 C)] 98.1 F (36.7 C) (07/23 0500) Pulse Rate:  [70-86] 71 (07/23 0500) Resp:  [14-21] 19 (07/23 0500) BP: (113-156)/(58-70) 156/69 mmHg (07/23 0500) SpO2:  [87 %-99 %] 98 % (07/23 0500) Weight:  [90.538 kg (199 lb 9.6 oz)] 90.538 kg (199 lb 9.6 oz) (07/22 0956) Weight change:  Last BM Date: 07/10/15  Intake/Output from previous day: 07/22 0701 - 07/23 0700 In: 3200 [I.V.:3200] Out: 1630 [Urine:1500; Drains:80; Blood:50] Intake/Output this shift:    PHYSICAL EXAM: Incision looks excellent, there is no swelling. JP is functioning normally. Tongue closure is intact.  Lab Results:  Recent Labs  07/11/15 1901  WBC 11.0*  HGB 11.9*  HCT 36.4  PLT 208   BMET  Recent Labs  07/11/15 1901  CREATININE 0.70    Studies/Results: No results found.  Medications: I have reviewed the patient's current medications.  Assessment/Plan: Stable, postop day 1, doing well. Continue JP. May go home after the drain is removed.    Travante Knee 07/12/2015, 8:14 AM

## 2015-07-13 DIAGNOSIS — Z79899 Other long term (current) drug therapy: Secondary | ICD-10-CM | POA: Diagnosis not present

## 2015-07-13 DIAGNOSIS — C021 Malignant neoplasm of border of tongue: Secondary | ICD-10-CM | POA: Diagnosis present

## 2015-07-13 DIAGNOSIS — Z981 Arthrodesis status: Secondary | ICD-10-CM | POA: Diagnosis not present

## 2015-07-13 DIAGNOSIS — E119 Type 2 diabetes mellitus without complications: Secondary | ICD-10-CM | POA: Diagnosis present

## 2015-07-13 DIAGNOSIS — G4730 Sleep apnea, unspecified: Secondary | ICD-10-CM | POA: Diagnosis present

## 2015-07-13 DIAGNOSIS — K219 Gastro-esophageal reflux disease without esophagitis: Secondary | ICD-10-CM | POA: Diagnosis present

## 2015-07-13 DIAGNOSIS — Z7982 Long term (current) use of aspirin: Secondary | ICD-10-CM | POA: Diagnosis not present

## 2015-07-13 DIAGNOSIS — Z885 Allergy status to narcotic agent status: Secondary | ICD-10-CM | POA: Diagnosis not present

## 2015-07-13 DIAGNOSIS — E039 Hypothyroidism, unspecified: Secondary | ICD-10-CM | POA: Diagnosis present

## 2015-07-13 DIAGNOSIS — Z91048 Other nonmedicinal substance allergy status: Secondary | ICD-10-CM | POA: Diagnosis not present

## 2015-07-13 DIAGNOSIS — Z808 Family history of malignant neoplasm of other organs or systems: Secondary | ICD-10-CM | POA: Diagnosis not present

## 2015-07-13 DIAGNOSIS — K589 Irritable bowel syndrome without diarrhea: Secondary | ICD-10-CM | POA: Diagnosis present

## 2015-07-13 DIAGNOSIS — J45909 Unspecified asthma, uncomplicated: Secondary | ICD-10-CM | POA: Diagnosis present

## 2015-07-13 LAB — GLUCOSE, CAPILLARY
GLUCOSE-CAPILLARY: 117 mg/dL — AB (ref 65–99)
GLUCOSE-CAPILLARY: 109 mg/dL — AB (ref 65–99)
Glucose-Capillary: 137 mg/dL — ABNORMAL HIGH (ref 65–99)
Glucose-Capillary: 120 mg/dL — ABNORMAL HIGH (ref 65–99)

## 2015-07-13 NOTE — Progress Notes (Signed)
Patient ID: Amanda Carlson, female   DOB: 1946-10-31, 69 y.o.   MRN: [id number] Subjective: Doing well, much less pain, tolerating liquids very well.  Objective: Vital signs in last 24 hours: Temp:  [98 F (36.7 C)-98.6 F (37 C)] 98 F (36.7 C) (07/24 0452) Pulse Rate:  [64-82] 82 (07/24 0452) Resp:  [16-17] 17 (07/24 0452) BP: (115-145)/(57-68) 145/68 mmHg (07/24 0452) SpO2:  [96 %-100 %] 96 % (07/24 0808) Weight change:  Last BM Date: 07/10/15  Intake/Output from previous day: 07/23 0701 - 07/24 0700 In: 1400 [P.O.:1400] Out: 2580 [Urine:2500; Drains:80] Intake/Output this shift: Total I/O In: 600 [P.O.:600] Out: 375 [Urine:375]  PHYSICAL EXAM: Tongue healing nicely, incision intact. No swelling or infection.   Lab Results:  Recent Labs  07/11/15 1901  WBC 11.0*  HGB 11.9*  HCT 36.4  PLT 208   BMET  Recent Labs  07/11/15 1901  CREATININE 0.70    Studies/Results: No results found.  Medications: I have reviewed the patient's current medications.  Assessment/Plan: Progressing well, JP output too high for drain removal. Perhaps tomorrow. Advance to soft diet.     Livingston Denner 07/13/2015, 10:43 AM

## 2015-07-13 NOTE — Progress Notes (Signed)
Patient to self-administer CPAP.  Patient is familiar with equipment and procedure, as patient wears CPAP at home.

## 2015-07-14 ENCOUNTER — Encounter (HOSPITAL_COMMUNITY): Payer: Self-pay | Admitting: Otolaryngology

## 2015-07-14 LAB — GLUCOSE, CAPILLARY: Glucose-Capillary: 128 mg/dL — ABNORMAL HIGH (ref 65–99)

## 2015-07-14 MED ORDER — HYDROCODONE-ACETAMINOPHEN 5-325 MG PO TABS: 1.0000 | ORAL_TABLET | ORAL | Status: DC | PRN

## 2015-07-14 NOTE — Progress Notes (Signed)
3 Days Post-Op  Subjective: She is doing great. Taking fluid and solids well. No pain. Drain still in  Objective: Vital signs in last 24 hours: Temp:  [97.6 F (36.4 C)-97.9 F (36.6 C)] 97.6 F (36.4 C) (07/25 0616) Pulse Rate:  [70-72] 72 (07/25 0616) Resp:  [16-17] 16 (07/25 0616) BP: (134-144)/(63-75) 134/72 mmHg (07/25 0616) SpO2:  [95 %-100 %] 100 % (07/25 0616) Last BM Date: 07/10/15  Intake/Output from previous day: 07/24 0701 - 07/25 0700 In: 1240 [P.O.:1240] Out: 3055 [Urine:2975; Drains:80] Intake/Output this shift:    tongue looks excellent. no swelling. suture intact. wound healing well  Lab Results:   Recent Labs  07/11/15 1901  WBC 11.0*  HGB 11.9*  HCT 36.4  PLT 208   BMET  Recent Labs  07/11/15 1901  CREATININE 0.70   PT/INR No results for input(s): LABPROT, INR in the last 72 hours. ABG No results for input(s): PHART, HCO3 in the last 72 hours.  Invalid input(s): PCO2, PO2  Studies/Results: No results found.  Anti-infectives: Anti-infectives    Start     Dose/Rate Route Frequency Ordered Stop   07/11/15 1730  clindamycin (CLEOCIN) IVPB 600 mg     600 mg 100 mL/hr over 30 Minutes Intravenous 3 times per day 07/11/15 1728     07/11/15 1245  clindamycin (CLEOCIN) IVPB 600 mg  Status:  Discontinued     600 mg 100 mL/hr over 30 Minutes Intravenous To Surgery 07/11/15 1243 07/11/15 1700      Assessment/Plan: s/p Procedure(s) with comments: RADICAL NECK DISSECTION (Left) - Left neck dissection and left tongue resection EXCISION OF TONGUE LESION (Left) drainage is still 60cc so cannot take drain out yet. she will go home with the drain and f/u in office this week  LOS: 1 day    Melissa Montane 07/14/2015

## 2015-07-14 NOTE — Progress Notes (Signed)
AVS discharge instructions were reviewed with patient. Patient demostrated how to drain and measure drainage from JP drain. Patient was also given and reviewed with the written instructions on how to care for and a place to record output from  drain . Patient was also given a cup with cc/ml measurements so that she could empty and measure drain output. Patient was shown what measurement to use from measurement cup given and told to use the cc measurement and the cc was the same as ml. Patient stated that she felt comfortable with managing the JP drain at home. Patient also stated that she told Dr. Janace Hoard that she had pain medication at home and did not need a prescription for pain medication, so no prescription was given. Patient stated that she did not have any questions. Volunteers called to assist to transportation.

## 2015-07-14 NOTE — Discharge Summary (Signed)
Physician Discharge Summary  Patient ID: Amanda Carlson MRN: [id number] DOB/AGE: Oct 30, 1946 69 y.o.  Admit date: 07/11/2015 Discharge date: 07/14/2015  Admission Diagnoses:scca left tongue   Discharge Diagnoses: same Active Problems:   Cancer of border of tongue   Discharged Condition: good  Hospital Course: excision of left tongue and left neck dissection. She did well but drainage still too much for removal. Tongue and wound excellent. Pain minimal and taking fluids well. Speech great. D/c to home with drain  Consults: None  Significant Diagnostic Studies: none  Treatments: surgery: as above  Discharge Exam: Blood pressure 134/72, pulse 72, temperature 97.6 F (36.4 C), temperature source Axillary, resp. rate 16, height 5\' 6"  (1.676 m), weight 90.538 kg (199 lb 9.6 oz), SpO2 100 %. awake and alert. tongue without any swelling and sutures intact. neck without infection or swelling. drain in. cv- rrr l- clear abd- soft ext- no tenderness or swelling.   Disposition: 01-Home or Self Care     Medication List    ASK your doctor about these medications        albuterol 108 (90 BASE) MCG/ACT inhaler  Commonly known as:  PROVENTIL HFA;VENTOLIN HFA  Inhale 2 puffs into the lungs every 6 (six) hours as needed for wheezing.     ALPRAZolam 0.25 MG tablet  Commonly known as:  XANAX  TAKE 1 TABLET BY MOUTH EVERY DAY AS NEEDED FOR ANXIETY OR SLEEP     amoxicillin 500 MG tablet  Commonly known as:  AMOXIL  Take 2,000 mg by mouth See admin instructions. Take 4 tablets (2000 mg) one hour before dental appointment     aspirin EC 81 MG tablet  Take 81 mg by mouth daily.     baclofen 20 MG tablet  Commonly known as:  LIORESAL  TAKE 1 TABLET BY MOUTH 4 TIMES A DAY AS NEEDED FOR MUSCLE SPASMS     Biotin 5000 MCG Caps  Take 5,000 mcg by mouth daily.     fluticasone 50 MCG/ACT nasal spray  Commonly known as:  FLONASE  Place 2 sprays into both nostrils daily as needed for  allergies or rhinitis.     Fluticasone-Salmeterol 250-50 MCG/DOSE Aepb  Commonly known as:  ADVAIR  Inhale 1 puff into the lungs every 12 (twelve) hours.     ADVAIR DISKUS 250-50 MCG/DOSE Aepb  Generic drug:  Fluticasone-Salmeterol  INAHLE 1 PUFF INTO LUNGS EVERY 12 HOURS     furosemide 20 MG tablet  Commonly known as:  LASIX  Take 1 tablet (20 mg total) by mouth daily as needed for fluid.     IMODIUM A-D 2 MG tablet  Generic drug:  loperamide  Take 2 mg by mouth 2 (two) times daily as needed for diarrhea or loose stools.     levothyroxine 100 MCG tablet  Commonly known as:  SYNTHROID, LEVOTHROID  Take 100 mcg by mouth daily before breakfast.     metFORMIN 500 MG 24 hr tablet  Commonly known as:  GLUCOPHAGE-XR  Take 500 mg by mouth 2 (two) times daily.     simvastatin 80 MG tablet  Commonly known as:  ZOCOR  Take 80 mg by mouth at bedtime.     traMADol 50 MG tablet  Commonly known as:  ULTRAM  TAKE 1 TABLET BY MOUTH EVERY 6 HOURS AS NEEDED FOR MODERATE-SEVERE PAIN     vitamin C 1000 MG tablet  Take 2,000 mg by mouth daily.     Vitamin D (Ergocalciferol) 50000 UNITS  Caps capsule  Commonly known as:  DRISDOL  Take 50,000 Units by mouth every 7 (seven) days. Mondays     Vitamin D 1000 UNITS capsule  Take 2,000 Units by mouth daily.     vitamin E 1000 UNIT capsule  Take 3,000 Units by mouth daily.     zolpidem 10 MG tablet  Commonly known as:  AMBIEN  Take 0.5-1 tablets (5-10 mg total) by mouth at bedtime as needed for sleep.     zoster vaccine live (PF) 19400 UNT/0.65ML injection  Commonly known as:  ZOSTAVAX  Inject 19,400 Units into the skin once.     ZYRTEC 10 MG tablet  Generic drug:  cetirizine  Take 10 mg by mouth daily.         SignedMelissa Montane 07/14/2015, 7:43 AM

## 2015-07-14 NOTE — Plan of Care (Signed)
Problem: Discharge Progression Outcomes Goal: Tubes and drains discontinued if indicated Outcome: Not Met (add Reason) Patient sent home with JP drain, d/c outpatient Goal: Staples/sutures removed Outcome: Not Applicable Date Met:  04/75/33 Staples in place to left neck, will be d/c outpatient

## 2015-07-14 NOTE — Discharge Instructions (Signed)
Nurses will teach you how to drain the bulb for the drain. Measure the drainage 2 times per day or 3 if more than 20 cc when emptying. Follow up on Wednesday in the office and call [phone] for appointment. Continue soft diet and fluids.  Call with any questions.

## 2015-07-28 ENCOUNTER — Other Ambulatory Visit: Payer: Self-pay | Admitting: Family Medicine

## 2015-08-06 ENCOUNTER — Other Ambulatory Visit: Payer: Self-pay | Admitting: Family Medicine

## 2015-08-06 ENCOUNTER — Telehealth: Payer: Self-pay | Admitting: Family Medicine

## 2015-08-06 DIAGNOSIS — G4700 Insomnia, unspecified: Secondary | ICD-10-CM

## 2015-08-06 NOTE — Telephone Encounter (Signed)
Please advise:   LOV  08/06/15 Tramadol/Ultram #120 as needed for severe pain. 0 Rf

## 2015-08-06 NOTE — Telephone Encounter (Signed)
Relation to UU:EKCM Call back number: [phone] Pharmacy: [id number] [place], [place] - [address] [phone] (Phone) [phone] (Fax)         Reason for call:  Patient states only 15 pills of her "sleeping pills" were called into pharmacy. Patient does not know the name of "sleeping pills" patient requesting a refill of 30 tablets.

## 2015-08-07 MED ORDER — ZOLPIDEM TARTRATE 10 MG PO TABS: 10.0000 mg | ORAL_TABLET | Freq: Every day | ORAL | Status: DC

## 2015-08-07 NOTE — Telephone Encounter (Signed)
Not sure but Zolpidem was last sent in June and it was 30 tabs per the computer. She is due for more if she wants. Same strength, same sig, #30 with 1 rf

## 2015-08-07 NOTE — Telephone Encounter (Signed)
Printed and on counter for signature. 

## 2015-08-07 NOTE — Telephone Encounter (Signed)
Faxed hardcopy for Zolpidem to [facility] [place]. Patient has been informed.

## 2015-08-26 ENCOUNTER — Other Ambulatory Visit: Payer: Self-pay | Admitting: Family Medicine

## 2015-08-26 NOTE — Telephone Encounter (Signed)
Faxed hardcopy for Tramadol To [facility] [place]

## 2015-08-29 ENCOUNTER — Telehealth: Payer: Self-pay | Admitting: Family Medicine

## 2015-08-29 NOTE — Telephone Encounter (Signed)
She is eligible for the Pneurmovax this year. Can have a nurse visit

## 2015-08-29 NOTE — Telephone Encounter (Signed)
Pt asking if she needs to get a pneumonia vaccine this year? Does she need regular flu shot or extra strength one for seniors?

## 2015-08-29 NOTE — Telephone Encounter (Signed)
Patient informed of PCP instructions regarding vaccines.  She states she will just schedule an OV at her convenience and have done at that time.

## 2015-08-29 NOTE — Telephone Encounter (Signed)
Advise please.  Patient had prevnar 13  On 04/11/14 and her flu shot 11/12/14 (regular strength)

## 2015-09-26 ENCOUNTER — Other Ambulatory Visit: Payer: Self-pay | Admitting: Family Medicine

## 2015-09-26 MED ORDER — TRAMADOL HCL 50 MG PO TABS: ORAL_TABLET | ORAL | Status: DC

## 2015-09-26 NOTE — Telephone Encounter (Signed)
Faxed hardcopy for Alprazolam to [facility] [place] 

## 2015-09-26 NOTE — Addendum Note (Signed)
Addended by: Sharon Seller B on: 09/26/2015 01:52 PM   Modules accepted: Orders

## 2015-09-26 NOTE — Telephone Encounter (Signed)
[facility] 

## 2015-09-26 NOTE — Addendum Note (Signed)
Addended by: Sharon Seller B on: 09/26/2015 02:58 PM   Modules accepted: Orders

## 2015-09-26 NOTE — Telephone Encounter (Signed)
[facility]

## 2015-09-26 NOTE — Telephone Encounter (Signed)
OK to refill Tramadol if it is not sooner than 30 days

## 2015-09-26 NOTE — Telephone Encounter (Signed)
v

## 2015-09-26 NOTE — Telephone Encounter (Signed)
Requesting: Tramadol Contract 11/12/14 UDS None Last OV  06/13/15 Last Refill  #120 with 0 refills 08/26/15  Please Advise

## 2015-09-26 NOTE — Telephone Encounter (Signed)
Advise on tramadol refill. 

## 2015-09-26 NOTE — Telephone Encounter (Signed)
Printed and on counter for signature. 

## 2015-09-30 ENCOUNTER — Other Ambulatory Visit: Payer: Self-pay | Admitting: Family Medicine

## 2015-10-01 ENCOUNTER — Other Ambulatory Visit: Payer: Self-pay | Admitting: Family Medicine

## 2015-10-01 DIAGNOSIS — Z23 Encounter for immunization: Secondary | ICD-10-CM | POA: Diagnosis not present

## 2015-10-28 ENCOUNTER — Other Ambulatory Visit: Payer: Self-pay | Admitting: Family Medicine

## 2015-10-28 NOTE — Telephone Encounter (Signed)
Requesting: zolpidem and Tramadol Contract None UDS  None Last OV  06/13/2015 Last Refill  Zolpidem  #30 with 1 refill on 08/07/2015                   Tramadol #120 with 0 refills on 09/26/2015  Please Advise

## 2015-10-28 NOTE — Telephone Encounter (Signed)
b

## 2015-10-28 NOTE — Telephone Encounter (Signed)
Faxed hardcopy for Alprazolam, zolpidem and Tramadol to [facility] [place]

## 2015-12-19 ENCOUNTER — Other Ambulatory Visit: Payer: Self-pay | Admitting: Family Medicine

## 2015-12-19 NOTE — Telephone Encounter (Signed)
Pt requesting refill. Please advise °

## 2015-12-19 NOTE — Telephone Encounter (Signed)
I am filled tramadol limited supply since I have never seen pt. Further refill form pcp.(covering for her today while Dr. Charlett Blake out of office.) refill baclofen today also.  Epic states allergy to other type meds. But pt has used tramadol before.

## 2015-12-23 ENCOUNTER — Telehealth: Payer: Self-pay | Admitting: Family Medicine

## 2015-12-23 NOTE — Telephone Encounter (Signed)
Caller name: Self   Can be reached: [phone]  Pharmacy:  [id number] [place],  - [address] [phone] (Phone) [phone] (Fax)         Reason for call: Patient states that she only received 45 tablets of her Tramadol and normally receives 120 tablets. Wants to know if she can get the remainder. Edward refilled the rx in the absence of Dr. Charlett Blake

## 2015-12-23 NOTE — Telephone Encounter (Signed)
Informed patient of below. Patient will call next week to speak with Dr. Charlett Blake

## 2015-12-23 NOTE — Telephone Encounter (Signed)
One month supply given only by Percell Miller since he was covering for Dr. Charlett Blake. Dr. Charlett Blake will be back next week, she can discuss getting remainder then.

## 2016-01-02 ENCOUNTER — Other Ambulatory Visit: Payer: Self-pay | Admitting: Family Medicine

## 2016-01-02 MED ORDER — TRAMADOL HCL 50 MG PO TABS: ORAL_TABLET | ORAL | Status: DC

## 2016-01-02 NOTE — Telephone Encounter (Signed)
Requesting: Tramadol Contract   11/12/2014 UDS   None Last OV   06/13/2015, next scheduled appointment is 02/12/2016 Last Refill   #45 with 0 refills on 12/19/2015  Please Advise

## 2016-01-02 NOTE — Telephone Encounter (Signed)
[facility] [place].  Patient informed

## 2016-01-02 NOTE — Telephone Encounter (Signed)
[facility] [place]  Reason for call: Pt called for refill on tramadol. Please call in today if possible.

## 2016-01-16 ENCOUNTER — Other Ambulatory Visit: Payer: Self-pay | Admitting: Family Medicine

## 2016-01-16 NOTE — Telephone Encounter (Signed)
Called left message to call back 

## 2016-01-16 NOTE — Telephone Encounter (Signed)
Requesting:  Tramadol Contract  11/12/2014 UDS  none Last OV  06/13/2015 Last Refill  #45 with 0 refills on 01/02/2016  Please Advise

## 2016-01-16 NOTE — Telephone Encounter (Signed)
Caller name: Self  Can be reached: [phone]   Reason for call: traMADol (ULTRAM) 50 MG tablet [id number] Requesting 120 tablets

## 2016-01-16 NOTE — Telephone Encounter (Signed)
Called the patient informed provider covering PCP would only cover enough for the weekend or could wait for PCP to return on Monday and received the entire requested amount.  The patient will wait for PCP to return on Monday to get her Tramadol requested refill #120. Will route request to PCP.

## 2016-01-17 NOTE — Telephone Encounter (Signed)
OK to refill Tramadol 120#

## 2016-01-19 ENCOUNTER — Other Ambulatory Visit: Payer: Self-pay | Admitting: Medical

## 2016-01-19 ENCOUNTER — Other Ambulatory Visit: Payer: Self-pay | Admitting: Family Medicine

## 2016-01-19 MED ORDER — TRAMADOL HCL 50 MG PO TABS: ORAL_TABLET | ORAL | Status: DC

## 2016-01-19 NOTE — Telephone Encounter (Signed)
Faxed hardcopy for alprazolam and [facility]pidem to [facility] [place]

## 2016-01-19 NOTE — Telephone Encounter (Signed)
Faxed hardcopy for Tramadol to [facility] in [place].  Patient has been made aware.

## 2016-01-19 NOTE — Telephone Encounter (Signed)
Requesting:  Alprazolam and Zolpidem Contract  01/12/2014 UDS   none Last OV   06/13/2015 Last Refill   Both were refilled on 10/28/2015   #30 with 1 refills  Please Advise

## 2016-01-19 NOTE — Telephone Encounter (Signed)
I think this is Dr. Charlett Blake pt. I did fill this script for pt. I have never seen pt before. I mus have filled when Dr. Randel Pigg was out of the office. So would you please send the refill request for baclofen to her.

## 2016-01-19 NOTE — Telephone Encounter (Signed)
Printed and on counter for signature. 

## 2016-01-19 NOTE — Addendum Note (Signed)
Addended by: Sharon Seller B on: 01/19/2016 07:41 AM   Modules accepted: Orders

## 2016-01-20 ENCOUNTER — Other Ambulatory Visit: Payer: Self-pay

## 2016-02-03 DIAGNOSIS — Z85810 Personal history of malignant neoplasm of tongue: Secondary | ICD-10-CM | POA: Diagnosis not present

## 2016-02-05 DIAGNOSIS — E559 Vitamin D deficiency, unspecified: Secondary | ICD-10-CM | POA: Diagnosis not present

## 2016-02-05 DIAGNOSIS — E039 Hypothyroidism, unspecified: Secondary | ICD-10-CM | POA: Diagnosis not present

## 2016-02-05 DIAGNOSIS — E119 Type 2 diabetes mellitus without complications: Secondary | ICD-10-CM | POA: Diagnosis not present

## 2016-02-05 DIAGNOSIS — E7800 Pure hypercholesterolemia, unspecified: Secondary | ICD-10-CM | POA: Diagnosis not present

## 2016-02-05 LAB — HEPATIC FUNCTION PANEL
ALT: 27 U/L (ref 7–35)
AST: 22 U/L (ref 13–35)

## 2016-02-05 LAB — LIPID PANEL
CHOLESTEROL: 162 mg/dL (ref 0–200)
HDL: 70 mg/dL (ref 35–70)
LDL Cholesterol: 71 mg/dL
TRIGLYCERIDES: 106 mg/dL (ref 40–160)

## 2016-02-05 LAB — TSH: TSH: 0.76 u[IU]/mL (ref <=5.90)

## 2016-02-05 LAB — BASIC METABOLIC PANEL
BUN: 20 mg/dL (ref 4–21)
Creatinine: 0.7 mg/dL (ref <=1.1)
GLUCOSE: 131 mg/dL

## 2016-02-05 LAB — HEMOGLOBIN A1C: Hemoglobin A1C: 6.4

## 2016-02-10 DIAGNOSIS — E119 Type 2 diabetes mellitus without complications: Secondary | ICD-10-CM | POA: Diagnosis not present

## 2016-02-10 DIAGNOSIS — E7800 Pure hypercholesterolemia, unspecified: Secondary | ICD-10-CM | POA: Diagnosis not present

## 2016-02-10 DIAGNOSIS — E049 Nontoxic goiter, unspecified: Secondary | ICD-10-CM | POA: Diagnosis not present

## 2016-02-10 DIAGNOSIS — E039 Hypothyroidism, unspecified: Secondary | ICD-10-CM | POA: Diagnosis not present

## 2016-02-11 ENCOUNTER — Other Ambulatory Visit (HOSPITAL_BASED_OUTPATIENT_CLINIC_OR_DEPARTMENT_OTHER): Payer: Self-pay | Admitting: Otolaryngology

## 2016-02-11 DIAGNOSIS — Z08 Encounter for follow-up examination after completed treatment for malignant neoplasm: Secondary | ICD-10-CM

## 2016-02-12 ENCOUNTER — Ambulatory Visit (INDEPENDENT_AMBULATORY_CARE_PROVIDER_SITE_OTHER): Payer: Medicare Other | Admitting: Family Medicine

## 2016-02-12 ENCOUNTER — Encounter: Payer: Self-pay | Admitting: Family Medicine

## 2016-02-12 ENCOUNTER — Ambulatory Visit (INDEPENDENT_AMBULATORY_CARE_PROVIDER_SITE_OTHER): Payer: Medicare Other

## 2016-02-12 ENCOUNTER — Ambulatory Visit (HOSPITAL_BASED_OUTPATIENT_CLINIC_OR_DEPARTMENT_OTHER)
Admission: RE | Admit: 2016-02-12 | Discharge: 2016-02-12 | Disposition: A | Discharge status: Home / Self Care | Payer: Medicare Other | Source: Ambulatory Visit | Attending: Otolaryngology | Admitting: Otolaryngology

## 2016-02-12 ENCOUNTER — Telehealth: Payer: Self-pay | Admitting: Family Medicine

## 2016-02-12 VITALS — BP 122/72 | HR 82 | Ht 66.0 in | Wt 191.8 lb

## 2016-02-12 VITALS — BP 118/80 | HR 90 | Temp 97.6°F | Ht 66.0 in | Wt 191.0 lb

## 2016-02-12 DIAGNOSIS — I10 Essential (primary) hypertension: Secondary | ICD-10-CM

## 2016-02-12 DIAGNOSIS — Z5189 Encounter for other specified aftercare: Secondary | ICD-10-CM | POA: Diagnosis not present

## 2016-02-12 DIAGNOSIS — E1169 Type 2 diabetes mellitus with other specified complication: Secondary | ICD-10-CM

## 2016-02-12 DIAGNOSIS — E039 Hypothyroidism, unspecified: Secondary | ICD-10-CM

## 2016-02-12 DIAGNOSIS — E669 Obesity, unspecified: Secondary | ICD-10-CM

## 2016-02-12 DIAGNOSIS — Z09 Encounter for follow-up examination after completed treatment for conditions other than malignant neoplasm: Secondary | ICD-10-CM

## 2016-02-12 DIAGNOSIS — E782 Mixed hyperlipidemia: Secondary | ICD-10-CM

## 2016-02-12 DIAGNOSIS — E119 Type 2 diabetes mellitus without complications: Secondary | ICD-10-CM

## 2016-02-12 DIAGNOSIS — F4323 Adjustment disorder with mixed anxiety and depressed mood: Secondary | ICD-10-CM | POA: Diagnosis not present

## 2016-02-12 DIAGNOSIS — Z08 Encounter for follow-up examination after completed treatment for malignant neoplasm: Secondary | ICD-10-CM

## 2016-02-12 DIAGNOSIS — Z0000 Encounter for general adult medical examination without abnormal findings: Secondary | ICD-10-CM | POA: Diagnosis not present

## 2016-02-12 DIAGNOSIS — Z23 Encounter for immunization: Secondary | ICD-10-CM | POA: Diagnosis not present

## 2016-02-12 DIAGNOSIS — B354 Tinea corporis: Secondary | ICD-10-CM

## 2016-02-12 DIAGNOSIS — G4700 Insomnia, unspecified: Secondary | ICD-10-CM

## 2016-02-12 DIAGNOSIS — C029 Malignant neoplasm of tongue, unspecified: Secondary | ICD-10-CM | POA: Insufficient documentation

## 2016-02-12 DIAGNOSIS — R109 Unspecified abdominal pain: Secondary | ICD-10-CM | POA: Diagnosis not present

## 2016-02-12 DIAGNOSIS — Z1159 Encounter for screening for other viral diseases: Secondary | ICD-10-CM

## 2016-02-12 LAB — MICROALBUMIN / CREATININE URINE RATIO
CREATININE, U: 154.5 mg/dL
Microalb Creat Ratio: 0.6 mg/g (ref 0.0–30.0)
Microalb, Ur: 0.9 mg/dL (ref 0.0–1.9)

## 2016-02-12 LAB — URINALYSIS
Bilirubin Urine: NEGATIVE
KETONES UR: NEGATIVE
Leukocytes, UA: NEGATIVE
Nitrite: NEGATIVE
Specific Gravity, Urine: >=1.03 — AB (ref 1.000–1.030)
Total Protein, Urine: NEGATIVE
URINE GLUCOSE: NEGATIVE
UROBILINOGEN UA: 0.2 (ref 0.0–1.0)
pH: 5 (ref 5.0–8.0)

## 2016-02-12 MED ORDER — PNEUMOCOCCAL VAC POLYVALENT 25 MCG/0.5ML IJ INJ: 0.5000 mL | INJECTION | Freq: Once | INTRAMUSCULAR | Status: DC

## 2016-02-12 MED ORDER — ZOLPIDEM TARTRATE 10 MG PO TABS: 10.0000 mg | ORAL_TABLET | Freq: Every day | ORAL | Status: DC

## 2016-02-12 MED ORDER — ALPRAZOLAM 0.25 MG PO TABS: ORAL_TABLET | ORAL | Status: DC

## 2016-02-12 NOTE — Progress Notes (Signed)
RN AWV note reviewed. Agree with documention and plan. 

## 2016-02-12 NOTE — Progress Notes (Signed)
Pre visit review using our clinic review tool, if applicable. No additional management support is needed unless otherwise documented below in the visit note. 

## 2016-02-12 NOTE — Progress Notes (Signed)
Subjective:   Amanda Carlson is a 70 y.o. female who presents for Medicare Annual (Subsequent) preventive examination.  Review of Systems:  No ROS Cardiac Risk Factors include: advanced age (>72men, >39 women);hypertension;diabetes mellitus;obesity (BMI >30kg/m2) (Hyperlipidemia)  Sleep patterns:  Sleeps at least 6-7 hours per night/wakes up at least once to void.    Home Safety/Smoke Alarms: Feels safe at home.  Lives alone in one level home.  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  Smoke alarms present.   Firearm Safety:  No firearms.   Seat Belt Safety/Bike Helmet:  Always wears seat belt. Sun exposure:  Fair sun exposure.  Never wears sun block.  Discussed sun block.    Counseling:   Eye Exam- 03/2015; plans to schedule next appt.   Dental- Goes every 6 months.   Female:  Pap- 04/11/14-normal     Mammo- Benign     Dexa scan-03/07/15-osteoporosis; discussed taking calcium + vitamin d & increase physical activity  CCS- 12/2004- Dr. Collene Mares; discussed     Objective:     Vitals: BP 122/72 mmHg  Pulse 82  Ht 5\' 6"  (1.676 m)  Wt 191 lb 12.8 oz (87 kg)  BMI 30.97 kg/m2  SpO2 96%  Tobacco History  Smoking status  . Never Smoker   Smokeless tobacco  . Never Used     Counseling given: No   Past Medical History  Diagnosis Date  . Knee pain, right 10/16/2011  . Knee pain, bilateral 10/16/2011  . HYPOTHYROIDISM 01/22/2011    Qualifier: Diagnosis of  By: Charlett Blake MD, Boyce Medici with Dr Chalmers Cater   . Overweight(278.02) 01/22/2011    Qualifier: Diagnosis of  By: Charlett Blake MD, Erline Levine    . Intrinsic asthma, unspecified 01/22/2011    Qualifier: Diagnosis of  By: Charlett Blake MD, Erline Levine    . THYROID CYST 01/22/2011    Qualifier: Diagnosis of  By: Charlett Blake MD, Erline Levine    . ALLERGIC RHINITIS, SEASONAL 01/22/2011    Qualifier: Diagnosis of  By: Charlett Blake MD, Erline Levine    . GERD 01/22/2011    Qualifier: Diagnosis of  By: Charlett Blake MD, Erline Levine    . Irritable bowel syndrome 01/22/2011    Qualifier: Diagnosis of  By: Charlett Blake MD, Erline Levine     . CTS (carpal tunnel syndrome) 06/07/2012  . Tinea corporis 06/07/2012  . Insomnia 06/07/2012  . Low back pain 10/04/2012  . Tongue lesion 01/18/2014  . Lesion of breast 01/18/2014    Calcified Right side? Follows at [facility]  . Cervical cancer screening 04/11/2014  . Medicare annual wellness visit, subsequent 04/14/2014  . Family history of adverse reaction to anesthesia     Pts mother has a difficult time waking up after anesthesia  . Shortness of breath dyspnea     with ambulation  . Rheumatic fever     as a child  . Bronchitis     hx of  . Diabetes mellitus without complication ([place])   . Diabetes mellitus type 2 in obese ([place]) 06/29/2015  . Sleep apnea     wears CPAP machine  . Arthritis   . Difficulty swallowing     Liquids and foods at times  . Numbness and tingling in hands   . Tongue cancer [facility])    Past Surgical History  Procedure Laterality Date  . Knee arthroscopy      right  . Cholecystectomy    . Tubal ligation    . Cervical fusion, discectomy, metal plate and 2 screws in place    .  Thyroidectomy, partial    . Radical neck dissection Left 07/11/2015    Procedure: RADICAL NECK DISSECTION;  Surgeon: Melissa Montane, MD;  Location: [facility];  Service: ENT;  Laterality: Left;  Left neck dissection and left tongue resection  . Excision of tongue lesion Left 07/11/2015    Procedure: EXCISION OF TONGUE LESION;  Surgeon: Melissa Montane, MD;  Location: [facility] OR;  Service: ENT;  Laterality: Left;   Family History  Problem Relation Age of Onset  . Atrial fibrillation Mother   . Other Mother     Back pain, CHF  . Arthritis Mother   . Osteoporosis Mother   . Heart disease Father   . Hypertension Father   . Diabetes Father   . Sleep apnea Father   . Atrial fibrillation Father     paroxysmal  . Cancer Father     skin cancers on face  . Diabetes Brother   . Diabetes Maternal Aunt     X 2 aunts  . Cancer Maternal Grandmother   . Diabetes Maternal Grandfather   . Stroke Paternal  Grandmother   . Heart disease Paternal Grandfather   . Stroke Paternal Grandfather    History  Sexual Activity  . Sexual Activity: Not on file    Outpatient Encounter Prescriptions as of 02/12/2016  Medication Sig  . albuterol (PROVENTIL HFA;VENTOLIN HFA) 108 (90 BASE) MCG/ACT inhaler Inhale 2 puffs into the lungs every 6 (six) hours as needed for wheezing. (Patient taking differently: Inhale 2 puffs into the lungs every 4 (four) hours as needed for wheezing or shortness of breath. )  . amoxicillin (AMOXIL) 500 MG tablet Take 2,000 mg by mouth See admin instructions. Take 4 tablets (2000 mg) one hour before dental appointment  . Ascorbic Acid (VITAMIN C) 1000 MG tablet Take 2,000 mg by mouth daily.  Marland Kitchen aspirin EC 81 MG tablet Take 81 mg by mouth daily.  . baclofen (LIORESAL) 20 MG tablet TAKE 1 TABLET BY MOUTH 4 TIMES A DAY AS NEEDED FOR MUSCLE SPASMS  . Biotin 5000 MCG CAPS Take 5,000 mcg by mouth daily.  . cetirizine (ZYRTEC) 10 MG tablet Take 10 mg by mouth daily.   . Cholecalciferol (VITAMIN D) 1000 UNITS capsule Take 2,000 Units by mouth daily.   . fluticasone (FLONASE) 50 MCG/ACT nasal spray PLACE 2 SPRAYS INTO BOTH NOSTRILS DAILY AS NEEDED FOR ALLERGIES OR RHINITIS.  Marland Kitchen Fluticasone-Salmeterol (ADVAIR DISKUS) 250-50 MCG/DOSE AEPB Inhale 1 puff into the lungs 2 (two) times daily.  . furosemide (LASIX) 20 MG tablet Take 1 tablet (20 mg total) by mouth daily as needed for fluid. (Patient taking differently: Take 20 mg by mouth daily as needed for fluid or edema. )  . levothyroxine (SYNTHROID, LEVOTHROID) 100 MCG tablet Take 100 mcg by mouth daily before breakfast.  . loperamide (IMODIUM A-D) 2 MG tablet Take 2 mg by mouth 2 (two) times daily as needed for diarrhea or loose stools.  . metFORMIN (GLUCOPHAGE-XR) 500 MG 24 hr tablet Take 500 mg by mouth 2 (two) times daily.   . simvastatin (ZOCOR) 80 MG tablet Take 80 mg by mouth at bedtime.   . traMADol (ULTRAM) 50 MG tablet TAKE 1 TABLET  EVERY 6 HOURS AS NEEDED FOR MODERATE TO SEVERE PAIN  . Vitamin D, Ergocalciferol, (DRISDOL) 50000 UNITS CAPS capsule Take 50,000 Units by mouth every 7 (seven) days. Mondays  . vitamin E 1000 UNIT capsule Take 3,000 Units by mouth daily.  . [DISCONTINUED] ALPRAZolam (XANAX) 0.25 MG tablet  TAKE 1 TABLET DAILY AS NEEDED FOR ANXIETY OR SLEEP  . [DISCONTINUED] zolpidem (AMBIEN) 10 MG tablet TAKE 1 TABLET AT BEDTIME  . [DISCONTINUED] Fluticasone-Salmeterol (ADVAIR) 250-50 MCG/DOSE AEPB Inhale 1 puff into the lungs every 12 (twelve) hours.  . [DISCONTINUED] HYDROcodone-acetaminophen (NORCO/VICODIN) 5-325 MG per tablet Take 1-2 tablets by mouth every 4 (four) hours as needed for moderate pain. (Patient not taking: Reported on 02/12/2016)   No facility-administered encounter medications on file as of 02/12/2016.    Activities of Daily Living In your present state of health, do you have any difficulty performing the following activities: 02/12/2016 07/11/2015  Hearing? N N  Vision? N N  Difficulty concentrating or making decisions? N N  Walking or climbing stairs? Y Y  Dressing or bathing? N N  Doing errands, shopping? N N  Preparing Food and eating ? Y -  Using the Toilet? Y -  In the past six months, have you accidently leaked urine? Y -  Do you have problems with loss of bowel control? N -  Managing your Medications? N -  Managing your Finances? N -  Housekeeping or managing your Housekeeping? N -    Patient Care Team: Mosie Lukes, MD as PCP - General (Family Medicine) Madelin Headings, DO as Referring Physician (Optometry) Erline Levine, MD as Consulting Physician (Neurosurgery) Melissa Montane, MD as Consulting Physician (Otolaryngology) Jacelyn Pi, MD as Consulting Physician (Endocrinology)    Assessment:  Deferred to PCP.    Exercise Activities and Dietary recommendations Exercise:  Currently doing therapy exercises.    Diet: Carb modified.  Eats 3 meals per day.  Drinks water.  No  soda.  Drinks Tea.  Fair amount of fruits and vegetables.    Goals    . Increase physical activity     Stationary bicycle Light strength training Water aerobics     . Lose 50 lbs by next year.        Fall Risk Fall Risk  02/12/2016 06/13/2015 04/14/2014  Falls in the past year? Yes No No  Number falls in past yr: 1 - -  Risk for fall due to : Impaired mobility - -  Risk for fall due to (comments): Walks with cane - -  Follow up Education provided;Falls prevention discussed - -   Depression Screen PHQ 2/9 Scores 02/12/2016 06/13/2015 04/14/2014  PHQ - 2 Score 0 0 0     Cognitive Testing MMSE - Mini Mental State Exam 02/12/2016  Orientation to time 5  Orientation to Place 5  Registration 3  Attention/ Calculation 5  Recall 3  Language- name 2 objects 2  Language- repeat 1  Language- follow 3 step command 3  Language- read & follow direction 1  Write a sentence 1  Copy design 1  Total score 30    Immunization History  Administered Date(s) Administered  . Influenza Whole 09/19/2010, 10/08/2011, 09/20/2012  . Influenza,inj,Quad PF,36+ Mos 11/12/2014  . Influenza-Unspecified 10/01/2015  . Pneumococcal Conjugate-13 04/11/2014  . Pneumococcal Polysaccharide-23 02/12/2016   Screening Tests Health Maintenance  Topic Date Due  . Hepatitis C Screening  August 27, 1946  . URINE MICROALBUMIN  12/23/1954  . ZOSTAVAX  12/23/2004  . COLONOSCOPY  12/20/2014  . OPHTHALMOLOGY EXAM  03/28/2015  . MAMMOGRAM  03/06/2016  . INFLUENZA VACCINE  07/20/2016  . HEMOGLOBIN A1C  08/04/2016  . FOOT EXAM  02/09/2017  . DEXA SCAN  Completed  . PNA vac Low Risk Adult  Completed      Plan:  Bring in a copy of Advanced Directive/Living Will at next office visit.   Continue doing brain stimulating activities (puzzles, reading, adult coloring books, staying active) to keep memory sharp.   Eat heart healthy diet (full of fruits, vegetables, whole grains, lean protein, water--limit salt, fat, and  sugar intake) and increase physical activity as tolerated.  Watch portion sizes.  Schedule eye exam, colonoscopy, and mammogram before the end of this year.    Follow up with Dr. Charlett Blake as scheduled.  Go by the lab today for lab work.    During the course of the visit the patient was educated and counseled about the following appropriate screening and preventive services:   Vaccines to include Pneumoccal, Influenza, Hepatitis B, Td, Zostavax, HCV  Electrocardiogram  Cardiovascular Disease  Colorectal cancer screening  Bone density screening  Diabetes screening  Glaucoma screening  Mammography/PAP  Nutrition counseling   Patient Instructions (the written plan) was given to the patient.   Rudene Anda, RN  02/12/2016

## 2016-02-12 NOTE — Telephone Encounter (Signed)
Pt filled out medical records request form at time of check out. Called location to get fax #. Lvm. Awaiting call back for fax # to fax form . Fwd form to front desk.

## 2016-02-12 NOTE — Patient Instructions (Addendum)
Clotrimazole cream to lesions twice daily, can be used with the Cortisone cream if unsure if this is just dermatitis rash or a fungal rash   Insomnia Insomnia is a sleep disorder that makes it difficult to fall asleep or to stay asleep. Insomnia can cause tiredness (fatigue), low energy, difficulty concentrating, mood swings, and poor performance at work or school.  There are three different ways to classify insomnia:  Difficulty falling asleep.  Difficulty staying asleep.  Waking up too early in the morning. Any type of insomnia can be long-term (chronic) or short-term (acute). Both are common. Short-term insomnia usually lasts for three months or less. Chronic insomnia occurs at least three times a week for longer than three months. CAUSES  Insomnia may be caused by another condition, situation, or substance, such as:  Anxiety.  Certain medicines.  Gastroesophageal reflux disease (GERD) or other gastrointestinal conditions.  Asthma or other breathing conditions.  Restless legs syndrome, sleep apnea, or other sleep disorders.  Chronic pain.  Menopause. This may include hot flashes.  Stroke.  Abuse of alcohol, tobacco, or illegal drugs.  Depression.  Caffeine.   Neurological disorders, such as Alzheimer disease.  An overactive thyroid (hyperthyroidism). The cause of insomnia may not be known. RISK FACTORS Risk factors for insomnia include:  Gender. Women are more commonly affected than men.  Age. Insomnia is more common as you get older.  Stress. This may involve your professional or personal life.  Income. Insomnia is more common in people with lower income.  Lack of exercise.   Irregular work schedule or night shifts.  Traveling between different time zones. SIGNS AND SYMPTOMS If you have insomnia, trouble falling asleep or trouble staying asleep is the main symptom. This may lead to other symptoms, such as:  Feeling fatigued.  Feeling nervous about  going to sleep.  Not feeling rested in the morning.  Having trouble concentrating.  Feeling irritable, anxious, or depressed. TREATMENT  Treatment for insomnia depends on the cause. If your insomnia is caused by an underlying condition, treatment will focus on addressing the condition. Treatment may also include:   Medicines to help you sleep.  Counseling or therapy.  Lifestyle adjustments. HOME CARE INSTRUCTIONS   Take medicines only as directed by your health care provider.  Keep regular sleeping and waking hours. Avoid naps.  Keep a sleep diary to help you and your health care provider figure out what could be causing your insomnia. Include:   When you sleep.  When you wake up during the night.  How well you sleep.   How rested you feel the next day.  Any side effects of medicines you are taking.  What you eat and drink.   Make your bedroom a comfortable place where it is easy to fall asleep:  Put up shades or special blackout curtains to block light from outside.  Use a white noise machine to block noise.  Keep the temperature cool.   Exercise regularly as directed by your health care provider. Avoid exercising right before bedtime.  Use relaxation techniques to manage stress. Ask your health care provider to suggest some techniques that may work well for you. These may include:  Breathing exercises.  Routines to release muscle tension.  Visualizing peaceful scenes.  Cut back on alcohol, caffeinated beverages, and cigarettes, especially close to bedtime. These can disrupt your sleep.  Do not overeat or eat spicy foods right before bedtime. This can lead to digestive discomfort that can make it hard for  you to sleep.  Limit screen use before bedtime. This includes:  Watching TV.  Using your smartphone, tablet, and computer.  Stick to a routine. This can help you fall asleep faster. Try to do a quiet activity, brush your teeth, and go to bed at the  same time each night.  Get out of bed if you are still awake after 15 minutes of trying to sleep. Keep the lights down, but try reading or doing a quiet activity. When you feel sleepy, go back to bed.  Make sure that you drive carefully. Avoid driving if you feel very sleepy.  Keep all follow-up appointments as directed by your health care provider. This is important. SEEK MEDICAL CARE IF:   You are tired throughout the day or have trouble in your daily routine due to sleepiness.  You continue to have sleep problems or your sleep problems get worse. SEEK IMMEDIATE MEDICAL CARE IF:   You have serious thoughts about hurting yourself or someone else.   This information is not intended to replace advice given to you by your health care provider. Make sure you discuss any questions you have with your health care provider.   Document Released: 12/03/2000 Document Revised: 08/27/2015 Document Reviewed: 09/06/2014 Elsevier Interactive Patient Education [employer].

## 2016-02-12 NOTE — Patient Instructions (Addendum)
Bring in a copy of Advanced Directive/Living Will at next office visit.   Continue doing brain stimulating activities (puzzles, reading, adult coloring books, staying active) to keep memory sharp.   Eat heart healthy diet (full of fruits, vegetables, whole grains, lean protein, water--limit salt, fat, and sugar intake) and increase physical activity as tolerated.  Watch portion sizes.  Schedule eye exam, colonoscopy, and mammogram before the end of this year.    Follow up with Dr. Charlett Blake as scheduled.  Go by the lab today for lab work.    Colonoscopy A colonoscopy is an exam to look at the entire large intestine (colon). This exam can help find problems such as tumors, polyps, inflammation, and areas of bleeding. The exam takes about 1 hour.  LET [facility] CARE PROVIDER KNOW ABOUT:   Any allergies you have.  All medicines you are taking, including vitamins, herbs, eye drops, creams, and over-the-counter medicines.  Previous problems you or members of your family have had with the use of anesthetics.  Any blood disorders you have.  Previous surgeries you have had.  Medical conditions you have. RISKS AND COMPLICATIONS  Generally, this is a safe procedure. However, as with any procedure, complications can occur. Possible complications include:  Bleeding.  Tearing or rupture of the colon wall.  Reaction to medicines given during the exam.  Infection (rare). BEFORE THE PROCEDURE   Ask your health care provider about changing or stopping your regular medicines.  You may be prescribed an oral bowel prep. This involves drinking a large amount of medicated liquid, starting the day before your procedure. The liquid will cause you to have multiple loose stools until your stool is almost clear or light green. This cleans out your colon in preparation for the procedure.  Do not eat or drink anything else once you have started the bowel prep, unless your health care provider tells  you it is safe to do so.  Arrange for someone to drive you home after the procedure. PROCEDURE   You will be given medicine to help you relax (sedative).  You will lie on your side with your knees bent.  A long, flexible tube with a light and camera on the end (colonoscope) will be inserted through the rectum and into the colon. The camera sends video back to a computer screen as it moves through the colon. The colonoscope also releases carbon dioxide gas to inflate the colon. This helps your health care provider see the area better.  During the exam, your health care provider may take a small tissue sample (biopsy) to be examined under a microscope if any abnormalities are found.  The exam is finished when the entire colon has been viewed. AFTER THE PROCEDURE   Do not drive for 24 hours after the exam.  You may have a small amount of blood in your stool.  You may pass moderate amounts of gas and have mild abdominal cramping or bloating. This is caused by the gas used to inflate your colon during the exam.  Ask when your test results will be ready and how you will get your results. Make sure you get your test results.   This information is not intended to replace advice given to you by your health care provider. Make sure you discuss any questions you have with your health care provider.   Document Released: 12/03/2000 Document Revised: 09/26/2013 Document Reviewed: 08/13/2013 Elsevier Interactive Patient Education 2016 [employer]  Fat and Cholesterol Restricted Diet  Getting too much fat and cholesterol in your diet may cause health problems. Following this diet helps keep your fat and cholesterol at normal levels. This can keep you from getting sick. WHAT TYPES OF FAT SHOULD I CHOOSE?  Choose monosaturated and polyunsaturated fats. These are found in foods such as olive oil, canola oil, flaxseeds, walnuts, almonds, and seeds.  Eat more omega-3 fats. Good choices include  salmon, mackerel, sardines, tuna, flaxseed oil, and ground flaxseeds.  Limit saturated fats. These are in animal products such as meats, butter, and cream. They can also be in plant products such as palm oil, palm kernel oil, and coconut oil.   Avoid foods with partially hydrogenated oils in them. These contain trans fats. Examples of foods that have trans fats are stick margarine, some tub margarines, cookies, crackers, and other baked goods. WHAT GENERAL GUIDELINES DO I NEED TO FOLLOW?   Check food labels. Look for the words "trans fat" and "saturated fat."  When preparing a meal:  Fill half of your plate with vegetables and green salads.  Fill one fourth of your plate with whole grains. Look for the word "whole" as the first word in the ingredient list.  Fill one fourth of your plate with lean protein foods.  Limit fruit to two servings a day. Choose fruit instead of juice.  Eat more foods with soluble fiber. Examples of foods with this type of fiber are apples, broccoli, carrots, beans, peas, and barley. Try to get 20-30 g (grams) of fiber per day.  Eat more home-cooked foods. Eat less at restaurants and buffets.  Limit or avoid alcohol.  Limit foods high in starch and sugar.  Limit fried foods.  Cook foods without frying them. Baking, boiling, grilling, and broiling are all great options.  Lose weight if you are overweight. Losing even a small amount of weight can help your overall health. It can also help prevent diseases such as diabetes and heart disease. WHAT FOODS CAN I EAT? Grains Whole grains, such as whole wheat or whole grain breads, crackers, cereals, and pasta. Unsweetened oatmeal, bulgur, barley, quinoa, or brown rice. Corn or whole wheat flour tortillas. Vegetables Fresh or frozen vegetables (raw, steamed, roasted, or grilled). Green salads. Fruits All fresh, canned (in natural juice), or frozen fruits. Meat and Other Protein Products Ground beef (85% or  leaner), grass-fed beef, or beef trimmed of fat. Skinless chicken or [place]. Ground chicken or [place]. Pork trimmed of fat. All fish and seafood. Eggs. Dried beans, peas, or lentils. Unsalted nuts or seeds. Unsalted canned or dry beans. Dairy Low-fat dairy products, such as skim or 1% milk, 2% or reduced-fat cheeses, low-fat ricotta or cottage cheese, or plain low-fat yogurt. Fats and Oils Tub margarines without trans fats. Light or reduced-fat mayonnaise and salad dressings. Avocado. Olive, canola, sesame, or safflower oils. Natural peanut or almond butter (choose ones without added sugar and oil). The items listed above may not be a complete list of recommended foods or beverages. Contact your dietitian for more options. WHAT FOODS ARE NOT RECOMMENDED? Grains White bread. White pasta. White rice. Cornbread. Bagels, pastries, and croissants. Crackers that contain trans fat. Vegetables White potatoes. Corn. Creamed or fried vegetables. Vegetables in a cheese sauce. Fruits Dried fruits. Canned fruit in light or heavy syrup. Fruit juice. Meat and Other Protein Products Fatty cuts of meat. Ribs, chicken wings, bacon, sausage, bologna, salami, chitterlings, fatback, hot dogs, bratwurst, and packaged luncheon meats. Liver and organ meats. Dairy Whole or 2% milk,  cream, half-and-half, and cream cheese. Whole milk cheeses. Whole-fat or sweetened yogurt. Full-fat cheeses. Nondairy creamers and whipped toppings. Processed cheese, cheese spreads, or cheese curds. Sweets and Desserts Corn syrup, sugars, honey, and molasses. Candy. Jam and jelly. Syrup. Sweetened cereals. Cookies, pies, cakes, donuts, muffins, and ice cream. Fats and Oils Butter, stick margarine, lard, shortening, ghee, or bacon fat. Coconut, palm kernel, or palm oils. Beverages Alcohol. Sweetened drinks (such as sodas, lemonade, and fruit drinks or punches). The items listed above may not be a complete list of foods and beverages to  avoid. Contact your dietitian for more information.   This information is not intended to replace advice given to you by your health care provider. Make sure you discuss any questions you have with your health care provider.   Document Released: 06/06/2012 Document Revised: 12/27/2014 Document Reviewed: 03/07/2014 Elsevier Interactive Patient Education 2016 [place].  Diabetes and Foot Care Diabetes may cause you to have problems because of poor blood supply (circulation) to your feet and legs. This may cause the skin on your feet to become thinner, break easier, and heal more slowly. Your skin may become dry, and the skin may peel and crack. You may also have nerve damage in your legs and feet causing decreased feeling in them. You may not notice minor injuries to your feet that could lead to infections or more serious problems. Taking care of your feet is one of the most important things you can do for yourself.  HOME CARE INSTRUCTIONS  Wear shoes at all times, even in the house. Do not go barefoot. Bare feet are easily injured.  Check your feet daily for blisters, cuts, and redness. If you cannot see the bottom of your feet, use a mirror or ask someone for help.  Wash your feet with warm water (do not use hot water) and mild soap. Then pat your feet and the areas between your toes until they are completely dry. Do not soak your feet as this can dry your skin.  Apply a moisturizing lotion or petroleum jelly (that does not contain alcohol and is unscented) to the skin on your feet and to dry, brittle toenails. Do not apply lotion between your toes.  Trim your toenails straight across. Do not dig under them or around the cuticle. File the edges of your nails with an emery board or nail file.  Do not cut corns or calluses or try to remove them with medicine.  Wear clean socks or stockings every day. Make sure they are not too tight. Do not wear knee-high stockings since they may decrease  blood flow to your legs.  Wear shoes that fit properly and have enough cushioning. To break in new shoes, wear them for just a few hours a day. This prevents you from injuring your feet. Always look in your shoes before you put them on to be sure there are no objects inside.  Do not cross your legs. This may decrease the blood flow to your feet.  If you find a minor scrape, cut, or break in the skin on your feet, keep it and the skin around it clean and dry. These areas may be cleansed with mild soap and water. Do not cleanse the area with peroxide, alcohol, or iodine.  When you remove an adhesive bandage, be sure not to damage the skin around it.  If you have a wound, look at it several times a day to make sure it is healing.  Do  not use heating pads or hot water bottles. They may burn your skin. If you have lost feeling in your feet or legs, you may not know it is happening until it is too late.  Make sure your health care provider performs a complete foot exam at least annually or more often if you have foot problems. Report any cuts, sores, or bruises to your health care provider immediately. SEEK MEDICAL CARE IF:   You have an injury that is not healing.  You have cuts or breaks in the skin.  You have an ingrown nail.  You notice redness on your legs or feet.  You feel burning or tingling in your legs or feet.  You have pain or cramps in your legs and feet.  Your legs or feet are numb.  Your feet always feel cold. SEEK IMMEDIATE MEDICAL CARE IF:   There is increasing redness, swelling, or pain in or around a wound.  There is a red line that goes up your leg.  Pus is coming from a wound.  You develop a fever or as directed by your health care provider.  You notice a bad smell coming from an ulcer or wound.   This information is not intended to replace advice given to you by your health care provider. Make sure you discuss any questions you have with your health care  provider.   Document Released: 12/03/2000 Document Revised: 08/08/2013 Document Reviewed: 05/15/2013 Elsevier Interactive Patient Education 2016 [place] in the Home  Falls can cause injuries. They can happen to people of all ages. There are many things you can do to make your home safe and to help prevent falls.  WHAT CAN I DO ON THE OUTSIDE OF MY HOME?  Regularly fix the edges of walkways and driveways and fix any cracks.  Remove anything that might make you trip as you walk through a door, such as a raised step or threshold.  Trim any bushes or trees on the path to your home.  Use bright outdoor lighting.  Clear any walking paths of anything that might make someone trip, such as rocks or tools.  Regularly check to see if handrails are loose or broken. Make sure that both sides of any steps have handrails.  Any raised decks and porches should have guardrails on the edges.  Have any leaves, snow, or ice cleared regularly.  Use sand or salt on walking paths during winter.  Clean up any spills in your garage right away. This includes oil or grease spills. WHAT CAN I DO IN THE BATHROOM?   Use night lights.  Install grab bars by the toilet and in the tub and shower. Do not use towel bars as grab bars.  Use non-skid mats or decals in the tub or shower.  If you need to sit down in the shower, use a plastic, non-slip stool.  Keep the floor dry. Clean up any water that spills on the floor as soon as it happens.  Remove soap buildup in the tub or shower regularly.  Attach bath mats securely with double-sided non-slip rug tape.  Do not have throw rugs and other things on the floor that can make you trip. WHAT CAN I DO IN THE BEDROOM?  Use night lights.  Make sure that you have a light by your bed that is easy to reach.  Do not use any sheets or blankets that are too big for your bed. They should not hang down  onto the floor.  Have a firm chair that  has side arms. You can use this for support while you get dressed.  Do not have throw rugs and other things on the floor that can make you trip. WHAT CAN I DO IN THE KITCHEN?  Clean up any spills right away.  Avoid walking on wet floors.  Keep items that you use a lot in easy-to-reach places.  If you need to reach something above you, use a strong step stool that has a grab bar.  Keep electrical cords out of the way.  Do not use floor polish or wax that makes floors slippery. If you must use wax, use non-skid floor wax.  Do not have throw rugs and other things on the floor that can make you trip. WHAT CAN I DO WITH MY STAIRS?  Do not leave any items on the stairs.  Make sure that there are handrails on both sides of the stairs and use them. Fix handrails that are broken or loose. Make sure that handrails are as long as the stairways.  Check any carpeting to make sure that it is firmly attached to the stairs. Fix any carpet that is loose or worn.  Avoid having throw rugs at the top or bottom of the stairs. If you do have throw rugs, attach them to the floor with carpet tape.  Make sure that you have a light switch at the top of the stairs and the bottom of the stairs. If you do not have them, ask someone to add them for you. WHAT ELSE CAN I DO TO HELP PREVENT FALLS?  Wear shoes that:  Do not have high heels.  Have rubber bottoms.  Are comfortable and fit you well.  Are closed at the toe. Do not wear sandals.  If you use a stepladder:  Make sure that it is fully opened. Do not climb a closed stepladder.  Make sure that both sides of the stepladder are locked into place.  Ask someone to hold it for you, if possible.  Clearly mark and make sure that you can see:  Any grab bars or handrails.  First and last steps.  Where the edge of each step is.  Use tools that help you move around (mobility aids) if they are needed. These  include:  Canes.  Walkers.  Scooters.  Crutches.  Turn on the lights when you go into a dark area. Replace any light bulbs as soon as they burn out.  Set up your furniture so you have a clear path. Avoid moving your furniture around.  If any of your floors are uneven, fix them.  If there are any pets around you, be aware of where they are.  Review your medicines with your doctor. Some medicines can make you feel dizzy. This can increase your chance of falling. Ask your doctor what other things that you can do to help prevent falls.   This information is not intended to replace advice given to you by your health care provider. Make sure you discuss any questions you have with your health care provider.   Document Released: 10/02/2009 Document Revised: 04/22/2015 Document Reviewed: 01/10/2015 Elsevier Interactive Patient Education [employer].

## 2016-02-13 LAB — URINE CULTURE
COLONY COUNT: NO GROWTH
ORGANISM ID, BACTERIA: NO GROWTH

## 2016-02-13 LAB — HEPATITIS C ANTIBODY: HCV AB: NEGATIVE

## 2016-02-18 ENCOUNTER — Telehealth: Payer: Self-pay | Admitting: Family Medicine

## 2016-02-18 MED ORDER — BACLOFEN 20 MG PO TABS: ORAL_TABLET | ORAL | Status: DC

## 2016-02-18 NOTE — Telephone Encounter (Signed)
Relationship to patient: self Can be reached: [phone]  Reason for call: Pt requesting call by 10:00am tomorrow 02/19/16 to discuss recent xray results and wants to know if she needs to schedule appt.

## 2016-02-19 ENCOUNTER — Other Ambulatory Visit: Payer: Self-pay | Admitting: Family Medicine

## 2016-02-19 DIAGNOSIS — R911 Solitary pulmonary nodule: Secondary | ICD-10-CM

## 2016-02-19 NOTE — Telephone Encounter (Signed)
Patient informed of CT for the Chest.

## 2016-02-19 NOTE — Telephone Encounter (Signed)
I did not order because it was not clear she wanted me to, does she want me to order it or Dr Janace Hoard. According to previous note she want it forwarded to Dr Janace Hoard.

## 2016-02-19 NOTE — Telephone Encounter (Signed)
Patient is scheduled for CT Chest on 3/7, she is asking about CT of liver and abd? Requesting to speak with nurse.

## 2016-02-19 NOTE — Telephone Encounter (Signed)
She has been a little confusing, but after speaking to her this morning she wants PCP to order and to do so at [facility].

## 2016-02-19 NOTE — Telephone Encounter (Signed)
The patient did agree to CT to be done (see xray results dated 02/12/16 from Dr. Janace Hoard) has not been ordered yet.  Please order to be done at [facility]. Also the patient would like to know if the CT will be checking other areas like the liver. ??

## 2016-02-21 ENCOUNTER — Other Ambulatory Visit: Payer: Self-pay | Admitting: Family Medicine

## 2016-02-22 ENCOUNTER — Encounter: Payer: Self-pay | Admitting: Family Medicine

## 2016-02-22 DIAGNOSIS — R109 Unspecified abdominal pain: Secondary | ICD-10-CM | POA: Insufficient documentation

## 2016-02-22 DIAGNOSIS — B354 Tinea corporis: Secondary | ICD-10-CM | POA: Insufficient documentation

## 2016-02-22 NOTE — Assessment & Plan Note (Signed)
hgba1c acceptable, minimize simple carbs. Increase exercise as tolerated. Continue current meds 

## 2016-02-22 NOTE — Assessment & Plan Note (Signed)
On Levothyroxine, continue to monitor 

## 2016-02-22 NOTE — Assessment & Plan Note (Signed)
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

## 2016-02-22 NOTE — Assessment & Plan Note (Signed)
Tolerating statin, encouraged heart healthy diet, avoid trans fats, minimize simple carbs and saturated fats. Increase exercise as tolerated 

## 2016-02-22 NOTE — Assessment & Plan Note (Signed)
Musculoskeletal in nature. Encouraged moist heat and gentle stretching as tolerated. May try NSAIDs and prescription meds as directed and report if symptoms worsen or seek immediate care. Report worsening or change in sympotms.

## 2016-02-22 NOTE — Assessment & Plan Note (Signed)
Well controlled, no changes to meds. Encouraged heart healthy diet such as the DASH diet and exercise as tolerated.  °

## 2016-02-22 NOTE — Progress Notes (Signed)
Patient ID: Amanda Carlson, female   DOB: [birth year], 70 y.o.   MRN: [id number]    Subjective:    Patient ID: Amanda Carlson, female    DOB: 05/26/1946, 70 y.o.   MRN: [id number]  Chief Complaint  Patient presents with  . Follow-up    HPI Patient is in today for follow up. Is noting some right flank pain but it does respond to movement. No injury or fall. No change in bowel habits. No anorexia, nausea, diarrhea or constipation. Denies polyuria or polydipsia. Has developed a recurrent rash which is mildly pruritic. Has responded somewhat to Cortisone cream. Denies CP/palp/SOB/HA/congestion/fevers/GI or GU c/o. Taking meds as prescribed  Past Medical History  Diagnosis Date  . Knee pain, right 10/16/2011  . Knee pain, bilateral 10/16/2011  . HYPOTHYROIDISM 01/22/2011    Qualifier: Diagnosis of  By: Charlett Blake MD, Boyce Medici with Dr Chalmers Cater   . Overweight(278.02) 01/22/2011    Qualifier: Diagnosis of  By: Charlett Blake MD, Erline Levine    . Intrinsic asthma, unspecified 01/22/2011    Qualifier: Diagnosis of  By: Charlett Blake MD, Erline Levine    . THYROID CYST 01/22/2011    Qualifier: Diagnosis of  By: Charlett Blake MD, Erline Levine    . ALLERGIC RHINITIS, SEASONAL 01/22/2011    Qualifier: Diagnosis of  By: Charlett Blake MD, Erline Levine    . GERD 01/22/2011    Qualifier: Diagnosis of  By: Charlett Blake MD, Erline Levine    . Irritable bowel syndrome 01/22/2011    Qualifier: Diagnosis of  By: Charlett Blake MD, Erline Levine    . CTS (carpal tunnel syndrome) 06/07/2012  . Tinea corporis 06/07/2012  . Insomnia 06/07/2012  . Low back pain 10/04/2012  . Tongue lesion 01/18/2014  . Lesion of breast 01/18/2014    Calcified Right side? Follows at [facility]  . Cervical cancer screening 04/11/2014  . Medicare annual wellness visit, subsequent 04/14/2014  . Family history of adverse reaction to anesthesia     Pts mother has a difficult time waking up after anesthesia  . Shortness of breath dyspnea     with ambulation  . Rheumatic fever     as a child  . Bronchitis     hx of  . Diabetes  mellitus without complication ([place])   . Diabetes mellitus type 2 in obese ([place]) 06/29/2015  . Sleep apnea     wears CPAP machine  . Arthritis   . Difficulty swallowing     Liquids and foods at times  . Numbness and tingling in hands   . Tongue cancer ([place])   . Tinea corporis 02/22/2016  . 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  SCC of tongue.      Past Surgical History  Procedure Laterality Date  . Knee arthroscopy      right  . Cholecystectomy    . Tubal ligation    . Cervical fusion, discectomy, metal plate and 2 screws in place    . Thyroidectomy, partial    . Radical neck dissection Left 07/11/2015    Procedure: RADICAL NECK DISSECTION;  Surgeon: Melissa Montane, MD;  Location: [facility];  Service: ENT;  Laterality: Left;  Left neck dissection and left tongue resection  . Excision of tongue lesion Left 07/11/2015    Procedure: EXCISION OF TONGUE LESION;  Surgeon: Melissa Montane, MD;  [facility];  Service: ENT;  Laterality: Left;    Family History  Problem Relation Age of Onset  . Atrial fibrillation  Mother   . Other Mother     Back pain, CHF  . Arthritis Mother   . Osteoporosis Mother   . Heart disease Father   . Hypertension Father   . Diabetes Father   . Sleep apnea Father   . Atrial fibrillation Father     paroxysmal  . Cancer Father     skin cancers on face  . Diabetes Brother   . Diabetes Maternal Aunt     X 2 aunts  . Cancer Maternal Grandmother   . Diabetes Maternal Grandfather   . Stroke Paternal Grandmother   . Heart disease Paternal Grandfather   . Stroke Paternal Grandfather     Social History   Social History  . Marital Status: Divorced    Spouse Name: N/A  . Number of Children: N/A  . Years of Education: N/A   Occupational History  . Not on file.   Social History Main Topics  . Smoking status: Never Smoker   . Smokeless tobacco: Never Used  . Alcohol Use: No     Comment: No intake of alcohol  in greater than 10 yrs  . Drug Use: No  . Sexual Activity: Not on file   Other Topics Concern  . Not on file   Social History Narrative    Outpatient Prescriptions Prior to Visit  Medication Sig Dispense Refill  . albuterol (PROVENTIL HFA;VENTOLIN HFA) 108 (90 BASE) MCG/ACT inhaler Inhale 2 puffs into the lungs every 6 (six) hours as needed for wheezing. (Patient taking differently: Inhale 2 puffs into the lungs every 4 (four) hours as needed for wheezing or shortness of breath. ) 1 Inhaler 1  . amoxicillin (AMOXIL) 500 MG tablet Take 2,000 mg by mouth See admin instructions. Take 4 tablets (2000 mg) one hour before dental appointment    . Ascorbic Acid (VITAMIN C) 1000 MG tablet Take 2,000 mg by mouth daily.    Marland Kitchen aspirin EC 81 MG tablet Take 81 mg by mouth daily.    . Biotin 5000 MCG CAPS Take 5,000 mcg by mouth daily.    . cetirizine (ZYRTEC) 10 MG tablet Take 10 mg by mouth daily.     . Cholecalciferol (VITAMIN D) 1000 UNITS capsule Take 2,000 Units by mouth daily.     . fluticasone (FLONASE) 50 MCG/ACT nasal spray PLACE 2 SPRAYS INTO BOTH NOSTRILS DAILY AS NEEDED FOR ALLERGIES OR RHINITIS. 16 g 6  . Fluticasone-Salmeterol (ADVAIR DISKUS) 250-50 MCG/DOSE AEPB Inhale 1 puff into the lungs 2 (two) times daily.    . furosemide (LASIX) 20 MG tablet Take 1 tablet (20 mg total) by mouth daily as needed for fluid. (Patient taking differently: Take 20 mg by mouth daily as needed for fluid or edema. ) 30 tablet 6  . levothyroxine (SYNTHROID, LEVOTHROID) 100 MCG tablet Take 100 mcg by mouth daily before breakfast.    . loperamide (IMODIUM A-D) 2 MG tablet Take 2 mg by mouth 2 (two) times daily as needed for diarrhea or loose stools.    . metFORMIN (GLUCOPHAGE-XR) 500 MG 24 hr tablet Take 500 mg by mouth 2 (two) times daily.     . simvastatin (ZOCOR) 80 MG tablet Take 80 mg by mouth at bedtime.     . Vitamin D, Ergocalciferol, (DRISDOL) 50000 UNITS CAPS capsule Take 50,000 Units by mouth every 7  (seven) days. Mondays    . vitamin E 1000 UNIT capsule Take 3,000 Units by mouth daily.    Marland Kitchen ALPRAZolam (XANAX) 0.25 MG  tablet TAKE 1 TABLET DAILY AS NEEDED FOR ANXIETY OR SLEEP 30 tablet 1  . baclofen (LIORESAL) 20 MG tablet TAKE 1 TABLET BY MOUTH 4 TIMES A DAY AS NEEDED FOR MUSCLE SPASMS 120 tablet 0  . traMADol (ULTRAM) 50 MG tablet TAKE 1 TABLET EVERY 6 HOURS AS NEEDED FOR MODERATE TO SEVERE PAIN 120 tablet 0  . zolpidem (AMBIEN) 10 MG tablet TAKE 1 TABLET AT BEDTIME 30 tablet 1   No facility-administered medications prior to visit.    Allergies  Allergen Reactions  . Codeine Itching  . Tetanus Toxoid Swelling    Please do not give tetanus shot  . Adhesive [Tape] Rash    Please use paper tape    Review of Systems  Constitutional: Negative for fever and malaise/fatigue.  HENT: Negative for congestion.   Eyes: Negative for discharge.  Respiratory: Negative for shortness of breath.   Cardiovascular: Negative for chest pain, palpitations and leg swelling.  Gastrointestinal: Negative for nausea and abdominal pain.  Genitourinary: Positive for flank pain. Negative for dysuria.  Musculoskeletal: Negative for falls.  Skin: Positive for itching and rash.  Neurological: Negative for loss of consciousness and headaches.  Endo/Heme/Allergies: Negative for environmental allergies.  Psychiatric/Behavioral: Negative for depression. The patient is not nervous/anxious.        Objective:    Physical Exam  Constitutional: She is oriented to person, place, and time. She appears well-developed and well-nourished. No distress.  HENT:  Head: Normocephalic and atraumatic.  Nose: Nose normal.  Eyes: Right eye exhibits no discharge. Left eye exhibits no discharge.  Neck: Normal range of motion. Neck supple.  Cardiovascular: Normal rate and regular rhythm.   No murmur heard. Pulmonary/Chest: Effort normal and breath sounds normal.  Abdominal: Soft. Bowel sounds are normal. There is no  tenderness.  Musculoskeletal: She exhibits no edema.  Neurological: She is alert and oriented to person, place, and time.  Skin: Skin is warm and dry. Rash noted.  Arm, raised, scaly  Psychiatric: She has a normal mood and affect.  Nursing note and vitals reviewed.   BP 118/80 mmHg  Pulse 90  Temp(Src) 97.6 F (36.4 C) (Oral)  Ht 5\' 6"  (1.676 m)  Wt 191 lb (86.637 kg)  BMI 30.84 kg/m2  SpO2 98% Wt Readings from Last 3 Encounters:  02/12/16 191 lb (86.637 kg)  02/12/16 191 lb 12.8 oz (87 kg)  07/11/15 199 lb 9.6 oz (90.538 kg)     Lab Results  Component Value Date   WBC 11.0* 07/11/2015   HGB 11.9* 07/11/2015   HCT 36.4 07/11/2015   PLT 208 07/11/2015   GLUCOSE 108* 07/04/2015   CHOL 162 02/05/2016   TRIG 106 02/05/2016   HDL 70 02/05/2016   LDLCALC 71 02/05/2016   ALT 27 02/05/2016   AST 22 02/05/2016   NA 140 07/04/2015   K 4.1 07/04/2015   CL 107 07/04/2015   CREATININE 0.7 02/05/2016   BUN 20 02/05/2016   CO2 25 07/04/2015   TSH 0.76 02/05/2016   HGBA1C 6.4 02/05/2016   MICROALBUR 0.9 02/12/2016    Lab Results  Component Value Date   TSH 0.76 02/05/2016   Lab Results  Component Value Date   WBC 11.0* 07/11/2015   HGB 11.9* 07/11/2015   HCT 36.4 07/11/2015   MCV 90.5 07/11/2015   PLT 208 07/11/2015   Lab Results  Component Value Date   NA 140 07/04/2015   K 4.1 07/04/2015   CO2 25 07/04/2015   GLUCOSE  108* 07/04/2015   BUN 20 02/05/2016   CREATININE 0.7 02/05/2016   AST 22 02/05/2016   ALT 27 02/05/2016   ALBUMIN 3.6 10/04/2012   CALCIUM 9.3 07/04/2015   ANIONGAP 8 07/04/2015   GFR 88.50 10/04/2012   Lab Results  Component Value Date   CHOL 162 02/05/2016   Lab Results  Component Value Date   HDL 70 02/05/2016   Lab Results  Component Value Date   LDLCALC 71 02/05/2016   Lab Results  Component Value Date   TRIG 106 02/05/2016   No results found for: CHOLHDL Lab Results  Component Value Date   HGBA1C 6.4 02/05/2016        Assessment & Plan:   Problem List Items Addressed This Visit    Diabetes mellitus type 2 in obese ([place])    hgba1c acceptable, minimize simple carbs. Increase exercise as tolerated. Continue current meds      Flank pain - Primary    Musculoskeletal in nature. Encouraged moist heat and gentle stretching as tolerated. May try NSAIDs and prescription meds as directed and report if symptoms worsen or seek immediate care. Report worsening or change in sympotms.      Relevant Orders   Urinalysis (Completed)   Urine culture (Completed)   HTN (hypertension)    Well controlled, no changes to meds. Encouraged heart healthy diet such as the DASH diet and exercise as tolerated.       Hyperlipidemia, mixed    Tolerating statin, encouraged heart healthy diet, avoid trans fats, minimize simple carbs and saturated fats. Increase exercise as tolerated      Hypothyroidism    On Levothyroxine, continue to monitor      Insomnia   Medicare annual wellness visit, subsequent   Relevant Orders   Hepatitis C Antibody (Completed)   Obesity    Encouraged DASH diet, decrease po intake and increase exercise as tolerated. Needs 7-8 hours of sleep nightly. Avoid trans fats, eat small, frequent meals every 4-5 hours with lean proteins, complex carbs and healthy fats. Minimize simple carbs, GMO foods.      Squamous cell carcinoma of tongue ([facility])    Still has some numbness on left side of face s/p resection but is doing much better.      Relevant Medications   ALPRAZolam (XANAX) 0.25 MG tablet   Tinea corporis    Clotrimazole and steroid cream prn for itching.      Relevant Medications   pneumococcal 23 valent vaccine (PNU-IMMUNE) injection 0.5 mL    Other Visit Diagnoses    Adjustment disorder with mixed anxiety and depressed mood        Need for immunization follow-up        Relevant Medications    pneumococcal 23 valent vaccine (PNU-IMMUNE) injection 0.5 mL    Other Relevant Orders     Hepatitis C Antibody (Completed)    Need for 23-polyvalent pneumococcal polysaccharide vaccine        Relevant Orders    Pneumococcal polysaccharide vaccine 23-valent greater than or equal to 2yo subcutaneous/IM (Completed)       I have changed Ms. Emme's ALPRAZolam and zolpidem. I am also having her maintain her cetirizine, Vitamin D, metFORMIN, amoxicillin, levothyroxine, simvastatin, albuterol, furosemide, aspirin EC, vitamin E, vitamin C, Vitamin D (Ergocalciferol), Biotin, loperamide, fluticasone, and Fluticasone-Salmeterol. We will continue to administer pneumococcal 23 valent vaccine.  Meds ordered this encounter  Medications  . ALPRAZolam (XANAX) 0.25 MG tablet    Sig: TAKE 1 TABLET  DAILY AS NEEDED FOR ANXIETY OR SLEEP May fill after 03/16/2016    Dispense:  30 tablet    Refill:  1    Not to exceed 4 additional fills before 04/25/2016.  . zolpidem (AMBIEN) 10 MG tablet    Sig: Take 1 tablet (10 mg total) by mouth at bedtime. May fill after 03/16/2016    Dispense:  30 tablet    Refill:  1    Not to exceed 4 additional fills before 04/25/2016.  . pneumococcal 23 valent vaccine (PNU-IMMUNE) injection 0.5 mL    Sig:      Penni Homans, MD

## 2016-02-22 NOTE — Assessment & Plan Note (Signed)
Clotrimazole and steroid cream prn for itching.

## 2016-02-22 NOTE — Assessment & Plan Note (Signed)
Encouraged DASH diet, decrease po intake and increase exercise as tolerated. Needs 7-8 hours of sleep nightly. Avoid trans fats, eat small, frequent meals every 4-5 hours with lean proteins, complex carbs and healthy fats. Minimize simple carbs, GMO foods. 

## 2016-02-23 NOTE — Telephone Encounter (Signed)
Faxed hardcopy for Tramadol To [facility] in [place].

## 2016-02-24 ENCOUNTER — Ambulatory Visit (HOSPITAL_COMMUNITY)
Admission: RE | Admit: 2016-02-24 | Discharge: 2016-02-24 | Disposition: A | Discharge status: Home / Self Care | Payer: Medicare Other | Source: Ambulatory Visit | Attending: Family Medicine | Admitting: Family Medicine

## 2016-02-24 DIAGNOSIS — C028 Malignant neoplasm of overlapping sites of tongue: Secondary | ICD-10-CM | POA: Diagnosis not present

## 2016-02-24 DIAGNOSIS — R911 Solitary pulmonary nodule: Secondary | ICD-10-CM

## 2016-02-24 DIAGNOSIS — Z85810 Personal history of malignant neoplasm of tongue: Secondary | ICD-10-CM | POA: Diagnosis not present

## 2016-02-24 DIAGNOSIS — R918 Other nonspecific abnormal finding of lung field: Secondary | ICD-10-CM | POA: Insufficient documentation

## 2016-02-26 ENCOUNTER — Other Ambulatory Visit: Payer: Self-pay | Admitting: Family Medicine

## 2016-02-26 ENCOUNTER — Telehealth: Payer: Self-pay | Admitting: Family Medicine

## 2016-02-26 DIAGNOSIS — Z85810 Personal history of malignant neoplasm of tongue: Secondary | ICD-10-CM

## 2016-02-26 DIAGNOSIS — R918 Other nonspecific abnormal finding of lung field: Secondary | ICD-10-CM

## 2016-02-26 NOTE — Telephone Encounter (Signed)
Relation to [id number] Call back number:[phone] Pharmacy:  Reason for call:  Patient returning your call regarding CT results.

## 2016-02-26 NOTE — Telephone Encounter (Signed)
Patient informed of results (CT results 02/24/16)

## 2016-02-27 ENCOUNTER — Encounter: Payer: Self-pay | Admitting: Family Medicine

## 2016-03-08 ENCOUNTER — Telehealth: Payer: Self-pay | Admitting: *Deleted

## 2016-03-08 NOTE — Telephone Encounter (Signed)
Received patient's medical records; forwarded to provider/SLS 03/20

## 2016-03-09 ENCOUNTER — Ambulatory Visit (HOSPITAL_COMMUNITY): Payer: Medicare Other

## 2016-03-11 ENCOUNTER — Ambulatory Visit (HOSPITAL_COMMUNITY)
Admission: RE | Admit: 2016-03-11 | Discharge: 2016-03-11 | Disposition: A | Discharge status: Home / Self Care | Payer: Medicare Other | Source: Ambulatory Visit | Attending: Family Medicine | Admitting: Family Medicine

## 2016-03-11 DIAGNOSIS — K5730 Diverticulosis of large intestine without perforation or abscess without bleeding: Secondary | ICD-10-CM | POA: Diagnosis not present

## 2016-03-11 DIAGNOSIS — Z85810 Personal history of malignant neoplasm of tongue: Secondary | ICD-10-CM | POA: Diagnosis not present

## 2016-03-11 DIAGNOSIS — D3501 Benign neoplasm of right adrenal gland: Secondary | ICD-10-CM | POA: Diagnosis not present

## 2016-03-11 DIAGNOSIS — I2510 Atherosclerotic heart disease of native coronary artery without angina pectoris: Secondary | ICD-10-CM | POA: Diagnosis not present

## 2016-03-11 DIAGNOSIS — R918 Other nonspecific abnormal finding of lung field: Secondary | ICD-10-CM | POA: Diagnosis not present

## 2016-03-11 LAB — GLUCOSE, CAPILLARY: GLUCOSE-CAPILLARY: 141 mg/dL — AB (ref 65–99)

## 2016-03-11 MED ORDER — FLUDEOXYGLUCOSE F - 18 (FDG) INJECTION
9.3000 | Freq: Once | INTRAVENOUS | Status: AC | PRN
  Administered 2016-03-11: 9.3 via INTRAVENOUS

## 2016-03-22 ENCOUNTER — Other Ambulatory Visit: Payer: Self-pay | Admitting: Family Medicine

## 2016-03-22 NOTE — Telephone Encounter (Signed)
Faxed hardcopy for Tramad[facility] to [facility] in [place] 

## 2016-04-13 DIAGNOSIS — Z85810 Personal history of malignant neoplasm of tongue: Secondary | ICD-10-CM | POA: Diagnosis not present

## 2016-04-26 ENCOUNTER — Other Ambulatory Visit: Payer: Self-pay | Admitting: Family Medicine

## 2016-04-26 NOTE — Telephone Encounter (Signed)
OK to refill tramadol with same sig, same number no refills, Rf Baclofen same sig, same number 1 rf, check with patient to confirm she is still taking the Advair and refill if she requests it.

## 2016-04-27 ENCOUNTER — Encounter: Payer: Self-pay | Admitting: Family Medicine

## 2016-04-27 NOTE — Telephone Encounter (Signed)
Patient informed prescription needs to be picked up at this time, explained contract and UDS. The patient agreed to come to the office to pickup tramadol, do contract and UDS.

## 2016-04-27 NOTE — Telephone Encounter (Signed)
Requesting:  Tramadol Contract   None UDS   NONE Last OV  02/12/2016 Last Refill   #120 with 0 refills on 03/22/2016  Please Advise

## 2016-04-27 NOTE — Telephone Encounter (Signed)
Please let her know we are having to do contracts and testing on everyone. She at least needs to sign a contract for now and preferably do a test as well although I am willing to wait for the next lab visit if need be

## 2016-05-13 ENCOUNTER — Ambulatory Visit: Payer: Medicare Other | Admitting: Family Medicine

## 2016-05-31 ENCOUNTER — Other Ambulatory Visit: Payer: Self-pay | Admitting: Family Medicine

## 2016-05-31 NOTE — Telephone Encounter (Signed)
Faxed hardcopy for Alprazolam and Zolpidem to [facility] [place].

## 2016-05-31 NOTE — Telephone Encounter (Signed)
Requesting:   Alprazolam and Zolpidem Contract  None UDS        None Last OV  02/12/2016 Last Refill  Both medications were last filled on 02/12/2016   #30 with 1 refill  Please Advise

## 2016-06-08 ENCOUNTER — Ambulatory Visit (HOSPITAL_BASED_OUTPATIENT_CLINIC_OR_DEPARTMENT_OTHER)
Admission: RE | Admit: 2016-06-08 | Discharge: 2016-06-08 | Disposition: A | Discharge status: Home / Self Care | Payer: Medicare Other | Source: Ambulatory Visit | Attending: Family Medicine | Admitting: Family Medicine

## 2016-06-08 ENCOUNTER — Ambulatory Visit (INDEPENDENT_AMBULATORY_CARE_PROVIDER_SITE_OTHER): Payer: Medicare Other | Admitting: Family Medicine

## 2016-06-08 ENCOUNTER — Other Ambulatory Visit: Payer: Self-pay | Admitting: Family Medicine

## 2016-06-08 ENCOUNTER — Encounter: Payer: Self-pay | Admitting: Family Medicine

## 2016-06-08 VITALS — BP 124/80 | HR 101 | Temp 98.7°F | Ht 66.0 in | Wt 186.5 lb

## 2016-06-08 DIAGNOSIS — E1169 Type 2 diabetes mellitus with other specified complication: Secondary | ICD-10-CM

## 2016-06-08 DIAGNOSIS — E669 Obesity, unspecified: Secondary | ICD-10-CM

## 2016-06-08 DIAGNOSIS — M545 Low back pain: Secondary | ICD-10-CM

## 2016-06-08 DIAGNOSIS — I779 Disorder of arteries and arterioles, unspecified: Secondary | ICD-10-CM | POA: Diagnosis not present

## 2016-06-08 DIAGNOSIS — E782 Mixed hyperlipidemia: Secondary | ICD-10-CM

## 2016-06-08 DIAGNOSIS — I10 Essential (primary) hypertension: Secondary | ICD-10-CM

## 2016-06-08 DIAGNOSIS — Z1231 Encounter for screening mammogram for malignant neoplasm of breast: Secondary | ICD-10-CM | POA: Insufficient documentation

## 2016-06-08 DIAGNOSIS — I739 Peripheral vascular disease, unspecified: Principal | ICD-10-CM

## 2016-06-08 DIAGNOSIS — R0989 Other specified symptoms and signs involving the circulatory and respiratory systems: Secondary | ICD-10-CM

## 2016-06-08 DIAGNOSIS — E119 Type 2 diabetes mellitus without complications: Secondary | ICD-10-CM

## 2016-06-08 DIAGNOSIS — E039 Hypothyroidism, unspecified: Secondary | ICD-10-CM

## 2016-06-08 DIAGNOSIS — K219 Gastro-esophageal reflux disease without esophagitis: Secondary | ICD-10-CM

## 2016-06-08 DIAGNOSIS — Z79891 Long term (current) use of opiate analgesic: Secondary | ICD-10-CM | POA: Diagnosis not present

## 2016-06-08 DIAGNOSIS — Z79899 Other long term (current) drug therapy: Secondary | ICD-10-CM | POA: Diagnosis not present

## 2016-06-08 MED ORDER — TRAMADOL HCL 50 MG PO TABS: 50.0000 mg | ORAL_TABLET | Freq: Four times a day (QID) | ORAL | Status: DC | PRN

## 2016-06-08 NOTE — Patient Instructions (Addendum)
Pain patches lidocaine and/or salon pas NOW probiotics 10 caps 1 cap daily  [place] or [employer]   Carotid Artery Disease The carotid arteries are the two main arteries on either side of the neck that supply blood to the brain. Carotid artery disease, also called carotid artery stenosis, is the narrowing or blockage of one or both carotid arteries. Carotid artery disease increases your risk for a stroke or a transient ischemic attack (TIA). A TIA is an episode in which a waxy, fatty substance that accumulates within the artery (plaque) blocks blood flow to the brain. A TIA is considered a "warning stroke."  CAUSES   Buildup of plaque inside the carotid arteries (atherosclerosis) (common).  A weakened outpouching in an artery (aneurysm).  Inflammation of the carotid artery (arteritis).  A fibrous growth within the carotid artery (fibromuscular dysplasia).  Tissue death within the carotid artery due to radiation treatment (post-radiation necrosis).  Decreased blood flow due to spasms of the carotid artery (vasospasm).  Separation of the walls of the carotid artery (carotid dissection). RISK FACTORS  High cholesterol (dyslipidemia).   High blood pressure (hypertension).   Smoking.   Obesity.   Diabetes.   Family history of cardiovascular disease.   Inactivity or lack of regular exercise.   Being female. Men have an increased risk of developing atherosclerosis earlier in life than women.  SYMPTOMS  Carotid artery disease does not cause symptoms. DIAGNOSIS Diagnosis of carotid artery disease may include:   A physical exam. Your health care provider may hear an abnormal sound (bruit) when listening to the carotid arteries.   Specific tests that look at the blood flow in the carotid arteries. These tests include:   Carotid artery ultrasonography.   Carotid or cerebral angiography.   Computerized tomographic angiography (CTA).    Magnetic resonance angiography (MRA).  TREATMENT  Treatment of carotid artery disease can include a combination of treatments. Treatment options include:  Surgery. You may have:   A carotid endarterectomy. This is a surgery to remove the blockages in the carotid arteries.   A carotid angioplasty with stenting. This is a nonsurgical interventional procedure. A wire mesh (stent) is used to widen the blocked carotid arteries.   Medicines to control blood pressure, cholesterol, and reduce blood clotting (antiplatelet therapy).   Adjusting your diet.   Lifestyle changes such as:   Quitting smoking.   Exercising as tolerated or as directed by your health care provider.   Controlling and maintaining a good blood pressure.   Keeping cholesterol levels under control.  HOME CARE INSTRUCTIONS   Take medicines only as directed by your health care provider. Make sure you understand all your medicine instructions. Do not stop your medicines without talking to your health care provider.   Follow your health care provider's diet instructions. It is important to eat a healthy diet that is low in saturated fats and includes plenty of fresh fruits, vegetables, and lean meats. High-fat, high-sodium foods as well as foods that are fried, overly processed, or have poor nutritional value should be avoided.  Maintain a healthy weight.   Stay physically active. It is recommended that you get at least 30 minutes of activity every day.   Do not use any tobacco products including cigarettes, chewing tobacco, or electronic cigarettes. If you need help quitting, ask your health care provider.  Limit alcohol use to:   No more than 2 drinks per day for men.   No more than 1 drink per  day for nonpregnant women.   Do not use illegal drugs.   Keep all follow-up visits as directed by your health care provider.  SEEK IMMEDIATE MEDICAL CARE IF:  You develop TIA or stroke symptoms.  These include:   Sudden weakness or numbness on one side of the body, such as in the face, arm, or leg.   Sudden confusion.   Trouble speaking (aphasia) or understanding.   Sudden trouble seeing out of one or both eyes.   Sudden trouble walking.   Dizziness or feeling like you might faint.   Loss of balance or coordination.   Sudden severe headache with no known cause.   Sudden trouble swallowing (dysphagia).  If you have any of these symptoms, call your local emergency services (911 in [place] Do not drive yourself to the clinic or hospital. This is a medical emergency.    This information is not intended to replace advice given to you by your health care provider. Make sure you discuss any questions you have with your health care provider.   Document Released: 02/28/2012 Document Revised: 12/27/2014 Document Reviewed: 06/06/2013 Elsevier Interactive Patient Education [employer].

## 2016-06-08 NOTE — Progress Notes (Signed)
Pre visit review using our clinic review tool, if applicable. No additional management support is needed unless otherwise documented below in the visit note. 

## 2016-06-11 ENCOUNTER — Ambulatory Visit (HOSPITAL_COMMUNITY): Payer: Medicare Other

## 2016-06-13 DIAGNOSIS — I739 Peripheral vascular disease, unspecified: Secondary | ICD-10-CM

## 2016-06-13 DIAGNOSIS — I779 Disorder of arteries and arterioles, unspecified: Secondary | ICD-10-CM | POA: Insufficient documentation

## 2016-06-13 NOTE — Assessment & Plan Note (Signed)
On Levothyroxine, continue to monitor 

## 2016-06-13 NOTE — Assessment & Plan Note (Signed)
Allowed to continue Tramadol prn

## 2016-06-13 NOTE — Progress Notes (Signed)
Patient ID: Amanda Carlson, female   DOB: 1946/02/22, 70 y.o.   MRN: [id number]   Subjective:    Patient ID: Amanda Carlson, female    DOB: 1946-10-21, 70 y.o.   MRN: [id number]  Chief Complaint  Patient presents with  . Follow-up    HPI Patient is in today for follow up. No recent illness or acute concerns. Continues to struggle with ongoing low back pain. Breathing is doing well not using Advair frequently. Denies CP/palp/SOB/HA/congestion/fevers/GI or GU c/o. Taking meds as prescribed  Past Medical History  Diagnosis Date  . Knee pain, right 10/16/2011  . Knee pain, bilateral 10/16/2011  . HYPOTHYROIDISM 01/22/2011    Qualifier: Diagnosis of  By: Charlett Blake MD, Boyce Medici with Dr Chalmers Cater   . Overweight(278.02) 01/22/2011    Qualifier: Diagnosis of  By: Charlett Blake MD, Erline Levine    . Intrinsic asthma, unspecified 01/22/2011    Qualifier: Diagnosis of  By: Charlett Blake MD, Erline Levine    . THYROID CYST 01/22/2011    Qualifier: Diagnosis of  By: Charlett Blake MD, Erline Levine    . ALLERGIC RHINITIS, SEASONAL 01/22/2011    Qualifier: Diagnosis of  By: Charlett Blake MD, Erline Levine    . GERD 01/22/2011    Qualifier: Diagnosis of  By: Charlett Blake MD, Erline Levine    . Irritable bowel syndrome 01/22/2011    Qualifier: Diagnosis of  By: Charlett Blake MD, Erline Levine    . CTS (carpal tunnel syndrome) 06/07/2012  . Tinea corporis 06/07/2012  . Insomnia 06/07/2012  . Low back pain 10/04/2012  . Tongue lesion 01/18/2014  . Lesion of breast 01/18/2014    Calcified Right side? Follows at [facility]  . Cervical cancer screening 04/11/2014  . Medicare annual wellness visit, subsequent 04/14/2014  . Family history of adverse reaction to anesthesia     Pts mother has a difficult time waking up after anesthesia  . Shortness of breath dyspnea     with ambulation  . Rheumatic fever     as a child  . Bronchitis     hx of  . Diabetes mellitus without complication ([place])   . Diabetes mellitus type 2 in obese ([place]) 06/29/2015  . Sleep apnea     wears CPAP machine  . Arthritis   .  Difficulty swallowing     Liquids and foods at times  . Numbness and tingling in hands   . Tongue cancer ([place])   . Tinea corporis 02/22/2016  . 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  SCC of tongue.      Past Surgical History  Procedure Laterality Date  . Knee arthroscopy      right  . Cholecystectomy    . Tubal ligation    . Cervical fusion, discectomy, metal plate and 2 screws in place    . Thyroidectomy, partial    . Radical neck dissection Left 07/11/2015    Procedure: RADICAL NECK DISSECTION;  Surgeon: Melissa Montane, MD;  Location: [facility];  Service: ENT;  Laterality: Left;  Left neck dissection and left tongue resection  . Excision of tongue lesion Left 07/11/2015    Procedure: EXCISION OF TONGUE LESION;  Surgeon: Melissa Montane, MD;  Location: [facility] OR;  Service: ENT;  Laterality: Left;    Family History  Problem Relation Age of Onset  . Atrial fibrillation Mother   . Other Mother     Back pain, CHF  . Arthritis Mother   . Osteoporosis Mother   .  Heart disease Father   . Hypertension Father   . Diabetes Father   . Sleep apnea Father   . Atrial fibrillation Father     paroxysmal  . Cancer Father     skin cancers on face  . Diabetes Brother   . Diabetes Maternal Aunt     X 2 aunts  . Cancer Maternal Grandmother   . Diabetes Maternal Grandfather   . Stroke Paternal Grandmother   . Heart disease Paternal Grandfather   . Stroke Paternal Grandfather     Social History   Social History  . Marital Status: Divorced    Spouse Name: N/A  . Number of Children: N/A  . Years of Education: N/A   Occupational History  . Not on file.   Social History Main Topics  . Smoking status: Never Smoker   . Smokeless tobacco: Never Used  . Alcohol Use: No     Comment: No intake of alcohol in greater than 10 yrs  . Drug Use: No  . Sexual Activity: Not on file   Other Topics Concern  . Not on file   Social History Narrative     Outpatient Prescriptions Prior to Visit  Medication Sig Dispense Refill  . ADVAIR DISKUS 250-50 MCG/DOSE AEPB INHALE 1 PUFF INTO THE LUNGS EVERY 12 HOURS 60 each 3  . albuterol (PROVENTIL HFA;VENTOLIN HFA) 108 (90 BASE) MCG/ACT inhaler Inhale 2 puffs into the lungs every 6 (six) hours as needed for wheezing. (Patient taking differently: Inhale 2 puffs into the lungs every 4 (four) hours as needed for wheezing or shortness of breath. ) 1 Inhaler 1  . ALPRAZolam (XANAX) 0.25 MG tablet TAKE 1 TABLET BY MOUTH EVERY DAY AS NEEDED FOR ANXIETY OR SLEEP DO NOT FILL BEFORE 03/16/16 30 tablet 1  . amoxicillin (AMOXIL) 500 MG tablet Take 2,000 mg by mouth See admin instructions. Take 4 tablets (2000 mg) one hour before dental appointment    . Ascorbic Acid (VITAMIN C) 1000 MG tablet Take 2,000 mg by mouth daily.    Marland Kitchen aspirin EC 81 MG tablet Take 81 mg by mouth daily.    . baclofen (LIORESAL) 20 MG tablet TAKE 1 TABLET 4 TIMES DAILY AS NEEDED FOR MUSCLE SPASMS 120 tablet 0  . Biotin 5000 MCG CAPS Take 5,000 mcg by mouth daily.    . cetirizine (ZYRTEC) 10 MG tablet Take 10 mg by mouth daily.     . Cholecalciferol (VITAMIN D) 1000 UNITS capsule Take 2,000 Units by mouth daily.     . fluticasone (FLONASE) 50 MCG/ACT nasal spray PLACE 2 SPRAYS INTO BOTH NOSTRILS DAILY AS NEEDED FOR ALLERGIES OR RHINITIS. 16 g 6  . Fluticasone-Salmeterol (ADVAIR DISKUS) 250-50 MCG/DOSE AEPB Inhale 1 puff into the lungs 2 (two) times daily.    . furosemide (LASIX) 20 MG tablet Take 1 tablet (20 mg total) by mouth daily as needed for fluid. (Patient taking differently: Take 20 mg by mouth daily as needed for fluid or edema. ) 30 tablet 6  . levothyroxine (SYNTHROID, LEVOTHROID) 100 MCG tablet Take 100 mcg by mouth daily before breakfast.    . loperamide (IMODIUM A-D) 2 MG tablet Take 2 mg by mouth 2 (two) times daily as needed for diarrhea or loose stools.    . metFORMIN (GLUCOPHAGE-XR) 500 MG 24 hr tablet Take 500 mg by mouth 2  (two) times daily.     . simvastatin (ZOCOR) 80 MG tablet Take 80 mg by mouth at bedtime.     Marland Kitchen  Vitamin D, Ergocalciferol, (DRISDOL) 50000 UNITS CAPS capsule Take 50,000 Units by mouth every 7 (seven) days. Mondays    . vitamin E 1000 UNIT capsule Take 3,000 Units by mouth daily.    Marland Kitchen zolpidem (AMBIEN) 10 MG tablet TAKE 1 TABLET BY MOUTH AT BEDTIME FILL 03/17/16 30 tablet 1  . traMADol (ULTRAM) 50 MG tablet TAKE 1 TABLET BY MOUTH EVERY 6 HOURS AS NEEDED FOR MODERATE TO SEVERE PAIN 120 tablet 0   No facility-administered medications prior to visit.    Allergies  Allergen Reactions  . Codeine Itching  . Tetanus Toxoid Swelling    Please do not give tetanus shot  . Adhesive [Tape] Rash    Please use paper tape    Review of Systems  Constitutional: Negative for fever and malaise/fatigue.  HENT: Negative for congestion.   Eyes: Negative for blurred vision.  Respiratory: Negative for shortness of breath.   Cardiovascular: Negative for chest pain, palpitations and leg swelling.  Gastrointestinal: Negative for nausea, abdominal pain and blood in stool.  Genitourinary: Negative for dysuria and frequency.  Musculoskeletal: Positive for back pain and joint pain. Negative for falls.  Skin: Negative for rash.  Neurological: Negative for dizziness, loss of consciousness and headaches.  Endo/Heme/Allergies: Negative for environmental allergies.  Psychiatric/Behavioral: Negative for depression. The patient is not nervous/anxious.        Objective:    Physical Exam  Constitutional: She is oriented to person, place, and time. She appears well-developed and well-nourished. No distress.  HENT:  Head: Normocephalic and atraumatic.  Nose: Nose normal.  Eyes: Right eye exhibits no discharge. Left eye exhibits no discharge.  Neck: Normal range of motion. Neck supple.  Cardiovascular: Normal rate and regular rhythm.   No murmur heard. Pulmonary/Chest: Effort normal and breath sounds normal.   Abdominal: Soft. Bowel sounds are normal. There is no tenderness.  Musculoskeletal: She exhibits no edema.  Neurological: She is alert and oriented to person, place, and time.  Skin: Skin is warm and dry.  Psychiatric: She has a normal mood and affect.  Nursing note and vitals reviewed.   BP 124/80 mmHg  Pulse 101  Temp(Src) 98.7 F (37.1 C) (Oral)  Ht 5\' 6"  (1.676 m)  Wt 186 lb 8 oz (84.596 kg)  BMI 30.12 kg/m2  SpO2 96% Wt Readings from Last 3 Encounters:  06/08/16 186 lb 8 oz (84.596 kg)  02/12/16 191 lb (86.637 kg)  02/12/16 191 lb 12.8 oz (87 kg)     Lab Results  Component Value Date   WBC 11.0* 07/11/2015   HGB 11.9* 07/11/2015   HCT 36.4 07/11/2015   PLT 208 07/11/2015   GLUCOSE 108* 07/04/2015   CHOL 162 02/05/2016   TRIG 106 02/05/2016   HDL 70 02/05/2016   LDLCALC 71 02/05/2016   ALT 27 02/05/2016   AST 22 02/05/2016   NA 140 07/04/2015   K 4.1 07/04/2015   CL 107 07/04/2015   CREATININE 0.7 02/05/2016   BUN 20 02/05/2016   CO2 25 07/04/2015   TSH 0.76 02/05/2016   HGBA1C 6.4 02/05/2016   MICROALBUR 0.9 02/12/2016    Lab Results  Component Value Date   TSH 0.76 02/05/2016   Lab Results  Component Value Date   WBC 11.0* 07/11/2015   HGB 11.9* 07/11/2015   HCT 36.4 07/11/2015   MCV 90.5 07/11/2015   PLT 208 07/11/2015   Lab Results  Component Value Date   NA 140 07/04/2015   K 4.1 07/04/2015  CO2 25 07/04/2015   GLUCOSE 108* 07/04/2015   BUN 20 02/05/2016   CREATININE 0.7 02/05/2016   AST 22 02/05/2016   ALT 27 02/05/2016   ALBUMIN 3.6 10/04/2012   CALCIUM 9.3 07/04/2015   ANIONGAP 8 07/04/2015   GFR 88.50 10/04/2012   Lab Results  Component Value Date   CHOL 162 02/05/2016   Lab Results  Component Value Date   HDL 70 02/05/2016   Lab Results  Component Value Date   LDLCALC 71 02/05/2016   Lab Results  Component Value Date   TRIG 106 02/05/2016   No results found for: CHOLHDL Lab Results  Component Value Date    HGBA1C 6.4 02/05/2016       Assessment & Plan:   Problem List Items Addressed This Visit    Low back pain    Allowed to continue Tramadol prn      Relevant Medications   traMADol (ULTRAM) 50 MG tablet   Hypothyroidism    On Levothyroxine, continue to monitor      Hyperlipidemia, mixed    Encouraged heart healthy diet, increase exercise, avoid trans fats, consider a krill oil cap daily      HTN (hypertension)    Well controlled, no changes to meds. Encouraged heart healthy diet such as the DASH diet and exercise as tolerated.       GERD    Avoid offending foods, start probiotics. Do not eat large meals in late evening and consider raising head of bed.       Diabetes mellitus type 2 in obese ([facility])    hgba1c acceptable, minimize simple carbs. Increase exercise as tolerated. Continue current meds      Carotid artery disease ([place]) - Primary    Bruit noted on right, carotid doppler is ordered      Relevant Orders   US Carotid Duplex Bilateral    Other Visit Diagnoses    Bruit of right carotid artery        Relevant Orders    US Carotid Duplex Bilateral       I have changed Ms. Sigmund's traMADol. I am also having her maintain her cetirizine, Vitamin D, metFORMIN, amoxicillin, levothyroxine, simvastatin, albuterol, furosemide, aspirin EC, vitamin E, vitamin C, Vitamin D (Ergocalciferol), Biotin, loperamide, fluticasone, Fluticasone-Salmeterol, baclofen, ADVAIR DISKUS, ALPRAZolam, and zolpidem.  Meds ordered this encounter  Medications  . traMADol (ULTRAM) 50 MG tablet    Sig: Take 1 tablet (50 mg total) by mouth every 6 (six) hours as needed.    Dispense:  120 tablet    Refill:  0    Not to exceed 5 additional fills before 09/18/2016.     Penni Homans, MD

## 2016-06-13 NOTE — Assessment & Plan Note (Signed)
Avoid offending foods, start probiotics. Do not eat large meals in late evening and consider raising head of bed.  

## 2016-06-13 NOTE — Assessment & Plan Note (Signed)
Encouraged heart healthy diet, increase exercise, avoid trans fats, consider a krill oil cap daily 

## 2016-06-13 NOTE — Assessment & Plan Note (Signed)
Well controlled, no changes to meds. Encouraged heart healthy diet such as the DASH diet and exercise as tolerated.  °

## 2016-06-13 NOTE — Assessment & Plan Note (Signed)
Bruit noted on right, carotid doppler is ordered

## 2016-06-13 NOTE — Assessment & Plan Note (Signed)
hgba1c acceptable, minimize simple carbs. Increase exercise as tolerated. Continue current meds 

## 2016-06-14 ENCOUNTER — Ambulatory Visit (HOSPITAL_COMMUNITY)
Admission: RE | Admit: 2016-06-14 | Discharge: 2016-06-14 | Disposition: A | Discharge status: Home / Self Care | Payer: Medicare Other | Source: Ambulatory Visit | Attending: Family Medicine | Admitting: Family Medicine

## 2016-06-14 ENCOUNTER — Telehealth: Payer: Self-pay | Admitting: Family Medicine

## 2016-06-14 DIAGNOSIS — I6523 Occlusion and stenosis of bilateral carotid arteries: Secondary | ICD-10-CM | POA: Insufficient documentation

## 2016-06-14 DIAGNOSIS — I739 Peripheral vascular disease, unspecified: Secondary | ICD-10-CM

## 2016-06-14 DIAGNOSIS — I779 Disorder of arteries and arterioles, unspecified: Secondary | ICD-10-CM

## 2016-06-14 DIAGNOSIS — R0989 Other specified symptoms and signs involving the circulatory and respiratory systems: Secondary | ICD-10-CM | POA: Diagnosis not present

## 2016-06-14 NOTE — Telephone Encounter (Signed)
Results mailed 

## 2016-06-14 NOTE — Telephone Encounter (Signed)
°  Relationship to patient: Self Can be reached: [phone]    Reason for call: Patient request that a copy of her recent labs be mailed to  [address] [place], [place] [address]

## 2016-06-29 ENCOUNTER — Encounter: Payer: Self-pay | Admitting: Family Medicine

## 2016-06-30 ENCOUNTER — Telehealth: Payer: Self-pay | Admitting: Family Medicine

## 2016-06-30 NOTE — Telephone Encounter (Signed)
Relation to [id number] Call back number: [phone] Pharmacy:  Reason for call:  Patient inquiring about her mamo results and would like results mailed to:  [place] [address]

## 2016-07-01 NOTE — Telephone Encounter (Signed)
Patient informed and upon request did mail a copy.

## 2016-07-01 NOTE — Telephone Encounter (Signed)
Radiology usually mails them but she is more than wlcome to another copy. They were normal

## 2016-07-14 DIAGNOSIS — Z85810 Personal history of malignant neoplasm of tongue: Secondary | ICD-10-CM | POA: Diagnosis not present

## 2016-08-03 ENCOUNTER — Other Ambulatory Visit: Payer: Self-pay | Admitting: Family Medicine

## 2016-08-03 NOTE — Telephone Encounter (Signed)
[facility] [place]

## 2016-08-03 NOTE — Telephone Encounter (Signed)
Last refill #30 with 1 refill 05/31/2016 Last office visit 06/08/2016 06/08/2016 contact

## 2016-08-28 DIAGNOSIS — Z23 Encounter for immunization: Secondary | ICD-10-CM | POA: Diagnosis not present

## 2016-09-03 ENCOUNTER — Other Ambulatory Visit: Payer: Self-pay | Admitting: Family Medicine

## 2016-09-10 ENCOUNTER — Telehealth: Payer: Self-pay

## 2016-09-10 NOTE — Telephone Encounter (Signed)
PA initiated for Zolpidem po QHS #30 for 30 days, Key: [id number] - PA Case ID: [id number]. Approved from 09/10/2016 through 09/11/2019. Approval letter sent to be scanned.     KP

## 2016-09-20 ENCOUNTER — Other Ambulatory Visit: Payer: Self-pay | Admitting: Family Medicine

## 2016-09-20 NOTE — Telephone Encounter (Signed)
[facility] [place]

## 2016-09-20 NOTE — Telephone Encounter (Signed)
Last seen 06/08/16 Last filled 09/03/16 #120-0 RF Please advise PC

## 2016-10-04 ENCOUNTER — Other Ambulatory Visit: Payer: Self-pay | Admitting: Family Medicine

## 2016-10-15 ENCOUNTER — Other Ambulatory Visit: Payer: Self-pay | Admitting: Family Medicine

## 2016-10-15 ENCOUNTER — Telehealth: Payer: Self-pay | Admitting: Family Medicine

## 2016-10-15 NOTE — Telephone Encounter (Signed)
Think she has been referred to [place] in the patient and think that Anderson Malta is aware of location

## 2016-10-15 NOTE — Telephone Encounter (Signed)
Patient call and wanted to ask Dr. Charlett Blake if she will call her insurance company and get them to approve her having a PET Scan instead of a CT Scan. States she wants to make sure everything in her mouth is ok and would prefer a PET Scan. Plse adv

## 2016-10-15 NOTE — Telephone Encounter (Signed)
Patient if ok with CT and would like to go to [place] to do this.

## 2016-10-15 NOTE — Telephone Encounter (Signed)
The radiology report after the PET scan earlier this year suggested a noncontrast CT of chest in 6 months. So that is what I will order otherwise I am afraid insurance will not pay. Let me know if she agrees and I will place order

## 2016-10-15 NOTE — Telephone Encounter (Signed)
Caller name: Relationship to patient: Self Can be reached: [phone] Pharmacy:  Reason for call: Patient request an order to have a PET Scan done at [facility] Plse adv

## 2016-10-15 NOTE — Telephone Encounter (Signed)
I will approve but she will be at 6 months on 12/20 and has no further appts. She should scedule

## 2016-10-15 NOTE — Telephone Encounter (Signed)
[facility] [place]  

## 2016-10-15 NOTE — Telephone Encounter (Signed)
[place] where? I do not use imaging in [place]. Maybe referral can help [place] identify a site

## 2016-10-15 NOTE — Telephone Encounter (Signed)
[facility] is where her last study was

## 2016-10-17 ENCOUNTER — Other Ambulatory Visit: Payer: Self-pay | Admitting: Family Medicine

## 2016-10-17 DIAGNOSIS — C069 Malignant neoplasm of mouth, unspecified: Secondary | ICD-10-CM

## 2016-10-17 NOTE — Telephone Encounter (Signed)
You can let her know I have ordered PET scan we will see if insurance approves.

## 2016-10-18 NOTE — Telephone Encounter (Signed)
Patient informed. 

## 2016-10-20 ENCOUNTER — Telehealth: Payer: Self-pay | Admitting: Family Medicine

## 2016-10-20 NOTE — Telephone Encounter (Signed)
Insurance auth # [id number], order sent to [facility] to scheduled

## 2016-10-20 NOTE — Telephone Encounter (Signed)
Caller name: Kerri-Anne Relation to pt: self Call back number: [phone] Pharmacy:  Reason for call: Pt states that from our office we are going to verify with insurance for Pet Scan approval and that she wanted to be sure that she receives any calls to confirm with insurance if it was approved to call her at the tel mentioned aboved, and to inform [place] to call her at this number too.

## 2016-10-29 ENCOUNTER — Ambulatory Visit (HOSPITAL_COMMUNITY): Payer: Medicare Other

## 2016-10-29 ENCOUNTER — Encounter (HOSPITAL_COMMUNITY)
Admission: RE | Admit: 2016-10-29 | Discharge: 2016-10-29 | Disposition: A | Discharge status: Home / Self Care | Payer: Medicare Other | Source: Ambulatory Visit | Attending: Family Medicine | Admitting: Family Medicine

## 2016-10-29 DIAGNOSIS — C069 Malignant neoplasm of mouth, unspecified: Secondary | ICD-10-CM

## 2016-10-29 DIAGNOSIS — C029 Malignant neoplasm of tongue, unspecified: Secondary | ICD-10-CM | POA: Diagnosis not present

## 2016-10-29 LAB — GLUCOSE, CAPILLARY: Glucose-Capillary: 130 mg/dL — ABNORMAL HIGH (ref 65–99)

## 2016-10-29 MED ORDER — FLUDEOXYGLUCOSE F - 18 (FDG) INJECTION
8.9400 | Freq: Once | INTRAVENOUS | Status: AC | PRN
  Administered 2016-10-29: 8.94 via INTRAVENOUS

## 2016-11-02 ENCOUNTER — Telehealth: Payer: Self-pay | Admitting: Family Medicine

## 2016-11-02 NOTE — Telephone Encounter (Signed)
-----   Message from Quintin Alto sent at 11/02/2016 11:15 AM EST ----- Regarding: Lung Doctor Contact: [phone] Patient request call back after 3 PM to ask about Lung doctor

## 2016-11-02 NOTE — Telephone Encounter (Signed)
The patient called back regarding the PET scan she had done on 10/29/16.  She is still concerned even though her results stated essentially unchanged.  She would like PCP to speak to have  Pulmonary specialist review and get back with her.  I did inform her it would be best as PCP suggested to have a lung MD referral.  But she states she is taking care of her mom and has a difficult time getting away. She wanted to confirm there is no cancer.

## 2016-11-03 ENCOUNTER — Telehealth: Payer: Self-pay | Admitting: Family Medicine

## 2016-11-03 NOTE — Telephone Encounter (Signed)
Pt called in to request to have a copy of her CAT scan sent to Dr. Harlow Mares her ENT provider.

## 2016-11-03 NOTE — Telephone Encounter (Signed)
It is difficult to give people absolutes. I do believe it would be in her best interest to see a pulmonologist. If she has ever seen one I can reach out to them, if not without an established relationship it is difficult for a pulmonologist to give an informed opinion. Sorry. Can alleviate her concerns more with a visit here or to the pulmonologist for an exam.

## 2016-11-03 NOTE — Telephone Encounter (Signed)
Called pt and confirmed that she wants PET scan sent to Dr. Melissa Montane. Results faxed as requested; fax confirmation received.

## 2016-11-04 NOTE — Telephone Encounter (Signed)
Spoke to the patient informed of PCP instructions. She will think about it and call back to request a pulmonary referral if decide to do one.

## 2016-11-05 ENCOUNTER — Other Ambulatory Visit: Payer: Self-pay | Admitting: Family Medicine

## 2016-11-05 NOTE — Telephone Encounter (Signed)
Phoned the prescription into the pharmacy.

## 2016-11-05 NOTE — Telephone Encounter (Signed)
[facility] [place]  Z[facility]em #30 with 1 refill. Had previously printed prescription/laid on counter for signature/later unable to locate prescription. Called the pharmacy and phoned in refill for this patient.

## 2016-11-05 NOTE — Telephone Encounter (Signed)
Requesting:  Zolipdem Contract   06/08/2016 UDS   Low next due  Last OV   06/08/2016 Last Refill   #30 with 1 refill on 08/03/2016  Please Advise

## 2016-11-05 NOTE — Telephone Encounter (Signed)
Faxed [facility] [place].

## 2016-11-10 DIAGNOSIS — E559 Vitamin D deficiency, unspecified: Secondary | ICD-10-CM | POA: Diagnosis not present

## 2016-11-10 DIAGNOSIS — E7800 Pure hypercholesterolemia, unspecified: Secondary | ICD-10-CM | POA: Diagnosis not present

## 2016-11-10 DIAGNOSIS — E039 Hypothyroidism, unspecified: Secondary | ICD-10-CM | POA: Diagnosis not present

## 2016-11-10 DIAGNOSIS — E119 Type 2 diabetes mellitus without complications: Secondary | ICD-10-CM | POA: Diagnosis not present

## 2016-11-22 ENCOUNTER — Telehealth: Payer: Self-pay | Admitting: Family Medicine

## 2016-11-22 NOTE — Telephone Encounter (Signed)
Mailed to [address]--[place] Patient informed ---PET Scan results mailed to her.

## 2016-11-22 NOTE — Telephone Encounter (Signed)
Cleaning out files - discarded RX from 04/27/16 for Ultram 50mg  #120 Dr. Charlett Blake

## 2016-11-22 NOTE — Telephone Encounter (Signed)
Patient request a copy of her PET Scan be mailed to her.

## 2016-11-25 DIAGNOSIS — K148 Other diseases of tongue: Secondary | ICD-10-CM | POA: Diagnosis not present

## 2016-11-25 DIAGNOSIS — R682 Dry mouth, unspecified: Secondary | ICD-10-CM | POA: Diagnosis not present

## 2016-11-25 DIAGNOSIS — Z85810 Personal history of malignant neoplasm of tongue: Secondary | ICD-10-CM | POA: Diagnosis not present

## 2016-12-07 ENCOUNTER — Other Ambulatory Visit: Payer: Self-pay | Admitting: Family Medicine

## 2016-12-07 NOTE — Telephone Encounter (Signed)
Requesting:  Alprazolam /  Tramadol / zolpidem Contract    06/08/2016 UDS   Low risk --due 12/08/16 Last OV   06/08/2016 Last Refill   Alprazolam  #30 with 2 refills on 09/03/16                    Tramadol   #120 with 0 refills on 10/15/2016                    Zolpidem  #30 with 1 refill on 11/05/2016  (Should have on refill left on this one)  Please Advise

## 2016-12-08 NOTE — Telephone Encounter (Signed)
Faxed hardcopy of all 3 alprazolam--tramadol--zolpidem [facility] [place]

## 2017-01-11 ENCOUNTER — Other Ambulatory Visit: Payer: Self-pay | Admitting: Family Medicine

## 2017-01-13 NOTE — Telephone Encounter (Signed)
Patient can have a one month supply of all 3 meds but since she has not been seen since June she needs an appt this month to keep these medications active. No more can be prescribed til seen

## 2017-01-13 NOTE — Telephone Encounter (Signed)
Requesting:   ALPRAZOLAM-TRAMADOL-BACLOFEN Contract   Signed on 06/08/2016 UDS    Low Risk---next was due on 12/08/2016 Last OV     06/08/2016---NO UPCOMING SCHEDULED APPOINTMENT Last Refill---ALPRAZOLAM--#30   12/07/2016                     tRAMADOL---#120       12/07/2016                     BACLOFEN ---#120     10/04/2016  Please Advise

## 2017-01-14 ENCOUNTER — Other Ambulatory Visit: Payer: Self-pay | Admitting: Family Medicine

## 2017-01-14 NOTE — Telephone Encounter (Signed)
FAXED TRAMADOL AND [facility]AZOLAM TO [facility] [place]--BACLOFEN WAS ELECTRONICALLY SETN. PATIENT AWARE SHE NEEDS APPOINTMENT FOR FURTHER REFILLS.

## 2017-02-10 ENCOUNTER — Other Ambulatory Visit: Payer: Self-pay | Admitting: Family Medicine

## 2017-02-10 NOTE — Telephone Encounter (Signed)
Faxed [facility] [place]

## 2017-02-10 NOTE — Telephone Encounter (Signed)
Last office visit 01/14/2017 Last refill #30  On 01/14/2017 06/08/1026 contract UDS Low risk is due

## 2017-02-14 ENCOUNTER — Other Ambulatory Visit: Payer: Self-pay | Admitting: Family Medicine

## 2017-02-14 NOTE — Telephone Encounter (Signed)
Relation to pt: self  Call back number:[phone] [facility]acy[id number] [place] [place] - [address] [place] [address] [place] [address] [phone] (Phone) [phone] (Fax)     Reason for call:  Patient requesting to pick up ALPRAZolam Duanne Moron) 0.25 MG tablet (which has been sent to pharmacy) and traMADol (ULTRAM) 50 MG tablet at the same time, requesting Tramadol refill today by noon, informed patient of office policy regarding medication refills patient states she has difficulty with transportation and trying to make one trip to the pharmacy, please advise

## 2017-02-14 NOTE — Telephone Encounter (Signed)
Last refill on 01/15/2016   #120 Last office visit on 06/08/2016---no future appt scheduled. Signed contract on 06/08/2016--low risk UDS due now.

## 2017-02-14 NOTE — Telephone Encounter (Signed)
She cannot have further refills til seen it has been over 6 months. OK to give #10 tabs to cover til she can be seen and she has to use it sparingly

## 2017-02-15 NOTE — Telephone Encounter (Signed)
Printed #10 Tramadol and contacted the patient of PCP's instructions.  She did agreed to do. Once signed will [facility] [place].

## 2017-03-10 DIAGNOSIS — E119 Type 2 diabetes mellitus without complications: Secondary | ICD-10-CM | POA: Diagnosis not present

## 2017-03-10 DIAGNOSIS — E039 Hypothyroidism, unspecified: Secondary | ICD-10-CM | POA: Diagnosis not present

## 2017-03-22 DIAGNOSIS — E559 Vitamin D deficiency, unspecified: Secondary | ICD-10-CM | POA: Diagnosis not present

## 2017-03-22 DIAGNOSIS — Z85810 Personal history of malignant neoplasm of tongue: Secondary | ICD-10-CM | POA: Diagnosis not present

## 2017-03-22 DIAGNOSIS — E049 Nontoxic goiter, unspecified: Secondary | ICD-10-CM | POA: Diagnosis not present

## 2017-03-22 DIAGNOSIS — E7800 Pure hypercholesterolemia, unspecified: Secondary | ICD-10-CM | POA: Diagnosis not present

## 2017-03-22 DIAGNOSIS — M810 Age-related osteoporosis without current pathological fracture: Secondary | ICD-10-CM | POA: Diagnosis not present

## 2017-03-23 ENCOUNTER — Other Ambulatory Visit (HOSPITAL_COMMUNITY): Payer: Self-pay | Admitting: Endocrinology

## 2017-03-23 DIAGNOSIS — E049 Nontoxic goiter, unspecified: Secondary | ICD-10-CM

## 2017-03-25 ENCOUNTER — Encounter (HOSPITAL_COMMUNITY): Payer: Self-pay

## 2017-03-25 ENCOUNTER — Ambulatory Visit (HOSPITAL_COMMUNITY)
Admission: RE | Admit: 2017-03-25 | Discharge: 2017-03-25 | Disposition: A | Discharge status: Home / Self Care | Payer: Medicare Other | Source: Ambulatory Visit | Attending: Endocrinology | Admitting: Endocrinology

## 2017-05-02 ENCOUNTER — Telehealth: Payer: Self-pay | Admitting: Family Medicine

## 2017-05-02 NOTE — Telephone Encounter (Signed)
Called patient to schedule awv. Lvm for patient to call office to schedule appt.  °

## 2017-05-04 DIAGNOSIS — T23201A Burn of second degree of right hand, unspecified site, initial encounter: Secondary | ICD-10-CM | POA: Diagnosis not present

## 2017-05-09 DIAGNOSIS — T23201A Burn of second degree of right hand, unspecified site, initial encounter: Secondary | ICD-10-CM | POA: Diagnosis not present

## 2017-05-13 DIAGNOSIS — T23201A Burn of second degree of right hand, unspecified site, initial encounter: Secondary | ICD-10-CM | POA: Diagnosis not present

## 2017-05-17 ENCOUNTER — Ambulatory Visit (HOSPITAL_COMMUNITY)
Admission: RE | Admit: 2017-05-17 | Discharge: 2017-05-17 | Disposition: A | Discharge status: Home / Self Care | Payer: Medicare Other | Source: Ambulatory Visit | Attending: Endocrinology | Admitting: Endocrinology

## 2017-05-17 DIAGNOSIS — Z9889 Other specified postprocedural states: Secondary | ICD-10-CM | POA: Diagnosis not present

## 2017-05-17 DIAGNOSIS — E049 Nontoxic goiter, unspecified: Secondary | ICD-10-CM | POA: Diagnosis present

## 2017-05-17 DIAGNOSIS — E042 Nontoxic multinodular goiter: Secondary | ICD-10-CM | POA: Insufficient documentation

## 2017-05-17 DIAGNOSIS — E048 Other specified nontoxic goiter: Secondary | ICD-10-CM | POA: Diagnosis not present

## 2017-05-19 DIAGNOSIS — T23201S Burn of second degree of right hand, unspecified site, sequela: Secondary | ICD-10-CM | POA: Diagnosis not present

## 2017-05-26 ENCOUNTER — Ambulatory Visit: Payer: Medicare Other | Admitting: Family Medicine

## 2017-06-08 ENCOUNTER — Telehealth: Payer: Self-pay | Admitting: *Deleted

## 2017-06-08 NOTE — Telephone Encounter (Signed)
LM for patient to return call to schedule AWV.   

## 2017-06-14 ENCOUNTER — Ambulatory Visit (INDEPENDENT_AMBULATORY_CARE_PROVIDER_SITE_OTHER): Payer: Medicare Other | Admitting: Family Medicine

## 2017-06-14 ENCOUNTER — Other Ambulatory Visit: Payer: Self-pay | Admitting: Family Medicine

## 2017-06-14 ENCOUNTER — Encounter: Payer: Self-pay | Admitting: Family Medicine

## 2017-06-14 VITALS — BP 128/68 | HR 81 | Temp 97.8°F | Resp 14 | Ht 66.0 in | Wt 184.6 lb

## 2017-06-14 DIAGNOSIS — E1169 Type 2 diabetes mellitus with other specified complication: Secondary | ICD-10-CM

## 2017-06-14 DIAGNOSIS — E669 Obesity, unspecified: Secondary | ICD-10-CM

## 2017-06-14 DIAGNOSIS — K219 Gastro-esophageal reflux disease without esophagitis: Secondary | ICD-10-CM | POA: Diagnosis not present

## 2017-06-14 DIAGNOSIS — I779 Disorder of arteries and arterioles, unspecified: Secondary | ICD-10-CM | POA: Diagnosis not present

## 2017-06-14 DIAGNOSIS — C021 Malignant neoplasm of border of tongue: Secondary | ICD-10-CM

## 2017-06-14 DIAGNOSIS — I10 Essential (primary) hypertension: Secondary | ICD-10-CM | POA: Diagnosis not present

## 2017-06-14 DIAGNOSIS — R918 Other nonspecific abnormal finding of lung field: Secondary | ICD-10-CM

## 2017-06-14 DIAGNOSIS — E782 Mixed hyperlipidemia: Secondary | ICD-10-CM

## 2017-06-14 DIAGNOSIS — Z1239 Encounter for other screening for malignant neoplasm of breast: Secondary | ICD-10-CM

## 2017-06-14 DIAGNOSIS — E039 Hypothyroidism, unspecified: Secondary | ICD-10-CM | POA: Diagnosis not present

## 2017-06-14 DIAGNOSIS — I739 Peripheral vascular disease, unspecified: Secondary | ICD-10-CM

## 2017-06-14 HISTORY — DX: Other nonspecific abnormal finding of lung field: R91.8

## 2017-06-14 MED ORDER — TRAMADOL HCL 50 MG PO TABS: 50.0000 mg | ORAL_TABLET | Freq: Three times a day (TID) | ORAL | 0 refills | Status: DC | PRN

## 2017-06-14 MED ORDER — ALPRAZOLAM 0.25 MG PO TABS: ORAL_TABLET | ORAL | 3 refills | Status: DC

## 2017-06-14 MED ORDER — FLUTICASONE PROPIONATE 50 MCG/ACT NA SUSP: NASAL | 6 refills | Status: DC

## 2017-06-14 MED ORDER — BACLOFEN 20 MG PO TABS: ORAL_TABLET | ORAL | 3 refills | Status: DC

## 2017-06-14 NOTE — Patient Instructions (Addendum)
At least 2-4 hours between dosing of Tramadol, Baclofen and Alprazolam Prefer only 1 Tramadol in am and then Bactofen and Tylenol/Actaminophen ES 500 mg tabs, 2 tabs at supper then Alprazolam only as needed at bed  Consider switching to 2 tylenol twice daily every day and skipping Tramadol altogether if you tolerate the above change.   Try Mylanta for heartburn and let us know if it starts happening more or mylanta does not help  Call us when you are ready to see Dr Domenic Polite in [place] so we can place referral   Carbohydrate Counting for Diabetes Mellitus, Adult Carbohydrate counting is a method for keeping track of how many carbohydrates you eat. Eating carbohydrates naturally increases the amount of sugar (glucose) in the blood. Counting how many carbohydrates you eat helps keep your blood glucose within normal limits, which helps you manage your diabetes (diabetes mellitus). It is important to know how many carbohydrates you can safely have in each meal. This is different for every person. A diet and nutrition specialist (registered dietitian) can help you make a meal plan and calculate how many carbohydrates you should have at each meal and snack. Carbohydrates are found in the following foods:  Grains, such as breads and cereals.  Dried beans and soy products.  Starchy vegetables, such as potatoes, peas, and corn.  Fruit and fruit juices.  Milk and yogurt.  Sweets and snack foods, such as cake, cookies, candy, chips, and soft drinks.  How do I count carbohydrates? There are two ways to count carbohydrates in food. You can use either of the methods or a combination of both. Reading "Nutrition Facts" on packaged food The "Nutrition Facts" list is included on the labels of almost all packaged foods and beverages in the [place] It includes:  The serving size.  Information about nutrients in each serving, including the grams (g) of carbohydrate per serving.  To use the "Nutrition  Facts":  Decide how many servings you will have.  Multiply the number of servings by the number of carbohydrates per serving.  The resulting number is the total amount of carbohydrates that you will be having.  Learning standard serving sizes of other foods When you eat foods containing carbohydrates that are not packaged or do not include "Nutrition Facts" on the label, you need to measure the servings in order to count the amount of carbohydrates:  Measure the foods that you will eat with a food scale or measuring cup, if needed.  Decide how many standard-size servings you will eat.  Multiply the number of servings by 15. Most carbohydrate-rich foods have about 15 g of carbohydrates per serving. ? For example, if you eat 8 oz (170 g) of strawberries, you will have eaten 2 servings and 30 g of carbohydrates (2 servings x 15 g = 30 g).  For foods that have more than one food mixed, such as soups and casseroles, you must count the carbohydrates in each food that is included.  The following list contains standard serving sizes of common carbohydrate-rich foods. Each of these servings has about 15 g of carbohydrates:   hamburger bun or  English muffin.   oz (15 mL) syrup.   oz (14 g) jelly.  1 slice of bread.  1 six-inch tortilla.  3 oz (85 g) cooked rice or pasta.  4 oz (113 g) cooked dried beans.  4 oz (113 g) starchy vegetable, such as peas, corn, or potatoes.  4 oz (113 g) hot cereal.  4  oz (113 g) mashed potatoes or  of a large baked potato.  4 oz (113 g) canned or frozen fruit.  4 oz (120 mL) fruit juice.  4-6 crackers.  6 chicken nuggets.  6 oz (170 g) unsweetened dry cereal.  6 oz (170 g) plain fat-free yogurt or yogurt sweetened with artificial sweeteners.  8 oz (240 mL) milk.  8 oz (170 g) fresh fruit or one small piece of fruit.  24 oz (680 g) popped popcorn.  Example of carbohydrate counting Sample meal  3 oz (85 g) chicken breast.  6 oz  (170 g) brown rice.  4 oz (113 g) corn.  8 oz (240 mL) milk.  8 oz (170 g) strawberries with sugar-free whipped topping. Carbohydrate calculation 1. Identify the foods that contain carbohydrates: ? Rice. ? Corn. ? Milk. ? Strawberries. 2. Calculate how many servings you have of each food: ? 2 servings rice. ? 1 serving corn. ? 1 serving milk. ? 1 serving strawberries. 3. Multiply each number of servings by 15 g: ? 2 servings rice x 15 g = 30 g. ? 1 serving corn x 15 g = 15 g. ? 1 serving milk x 15 g = 15 g. ? 1 serving strawberries x 15 g = 15 g. 4. Add together all of the amounts to find the total grams of carbohydrates eaten: ? 30 g + 15 g + 15 g + 15 g = 75 g of carbohydrates total. This information is not intended to replace advice given to you by your health care provider. Make sure you discuss any questions you have with your health care provider. Document Released: 12/06/2005 Document Revised: 06/25/2016 Document Reviewed: 05/19/2016 Elsevier Interactive Patient Education  [employer].

## 2017-06-14 NOTE — Assessment & Plan Note (Signed)
Tolerating statin, encouraged heart healthy diet, avoid trans fats, minimize simple carbs and saturated fats. Increase exercise as tolerated 

## 2017-06-14 NOTE — Assessment & Plan Note (Signed)
She has been seeing endocrinology for years and her sugar has been stable, she has asked [place] to take over prescribing and monitoring and I have agreed. We will request Dr Almetta Lovely last set of labs and plan to check labs with next visit.

## 2017-06-14 NOTE — Progress Notes (Signed)
Patient ID: Amanda Carlson, female   DOB: 28-Oct-1946, 71 y.o.   MRN: [id number]   Subjective:    Patient ID: Amanda Carlson, female    DOB: Nov 04, 1946, 71 y.o.   MRN: [id number]  Chief Complaint  Patient presents with  . Follow-up    Pt here for medication follow up, talk about CT scan results, breats exam,wants mammogtam today     HPI Patient is in today for follow up on numerous health concerns. She is requesting that she switch the monitoring of her DM, thyroid and cholesterol here from her endocrinologists office since she has been stable for some time. We would take over the prescribing her meds for above. She feels well. No recent febrile illness but Had two episodes of substernal burning she believes was heartburn without any associated symptoms. Denies palp/SOB/HA/congestion/fevers or GU c/o. Taking meds as prescribed  Past Medical History:  Diagnosis Date  . ALLERGIC RHINITIS, SEASONAL 01/22/2011   Qualifier: Diagnosis of  By: Charlett Blake MD, Erline Levine    . Arthritis   . Breast cancer screening 01/18/2014   Calcified Right side? Follows at [facility]   . Bronchitis    hx of  . Cervical cancer screening 04/11/2014  . CTS (carpal tunnel syndrome) 06/07/2012  . Diabetes mellitus type 2 in obese ([place]) 06/29/2015  . Diabetes mellitus without complication ([place])   . Difficulty swallowing    Liquids and foods at times  . Family history of adverse reaction to anesthesia    Pts mother has a difficult time waking up after anesthesia  . GERD 01/22/2011   Qualifier: Diagnosis of  By: Charlett Blake MD, Erline Levine    . HYPOTHYROIDISM 01/22/2011   Qualifier: Diagnosis of  By: Charlett Blake MD, Boyce Medici with Dr Chalmers Cater   . Insomnia 06/07/2012  . Intrinsic asthma, unspecified 01/22/2011   Qualifier: Diagnosis of  By: Charlett Blake MD, Erline Levine    . Irritable bowel syndrome 01/22/2011   Qualifier: Diagnosis of  By: Charlett Blake MD, Erline Levine    . Knee pain, bilateral 10/16/2011  . Knee pain, right 10/16/2011  . Lesion of breast 01/18/2014   Calcified Right side? Follows at [facility]  . Low back pain 10/04/2012  . Medicare annual wellness visit, subsequent 04/14/2014  . Numbness and tingling in hands   . Overweight(278.02) 01/22/2011   Qualifier: Diagnosis of  By: Charlett Blake MD, Erline Levine    . Pulmonary nodules 06/14/2017  . Rheumatic fever    as a child  . Shortness of breath dyspnea    with ambulation  . Sleep apnea    wears CPAP machine  . 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  SCC of tongue.    . THYROID CYST 01/22/2011   Qualifier: Diagnosis of  By: Charlett Blake MD, Erline Levine    . Tinea corporis 06/07/2012  . Tinea corporis 02/22/2016  . Tongue cancer ([place])   . Tongue lesion 01/18/2014    Past Surgical History:  Procedure Laterality Date  . cervical fusion, discectomy, metal plate and 2 screws in place    . CHOLECYSTECTOMY    . EXCISION OF TONGUE LESION Left 07/11/2015   Procedure: EXCISION OF TONGUE LESION;  Surgeon: Melissa Montane, MD;  Location: [facility];  Service: ENT;  Laterality: Left;  . KNEE ARTHROSCOPY     right  . RADICAL NECK DISSECTION Left 07/11/2015   Procedure: RADICAL NECK DISSECTION;  Surgeon: Melissa Montane, MD;  Location: [facility];  Service: ENT;  Laterality:  Left;  Left neck dissection and left tongue resection  . THYROIDECTOMY, PARTIAL    . TUBAL LIGATION      Family History  Problem Relation Age of Onset  . Atrial fibrillation Mother   . Other Mother        Back pain, CHF  . Arthritis Mother   . Osteoporosis Mother   . Heart disease Father   . Hypertension Father   . Diabetes Father   . Sleep apnea Father   . Atrial fibrillation Father        paroxysmal  . Cancer Father        skin cancers on face  . Diabetes Brother   . Diabetes Maternal Aunt        X 2 aunts  . Cancer Maternal Grandmother   . Diabetes Maternal Grandfather   . Stroke Paternal Grandmother   . Heart disease Paternal Grandfather   . Stroke Paternal Grandfather     Social History   Social  History  . Marital status: Divorced    Spouse name: N/A  . Number of children: N/A  . Years of education: N/A   Occupational History  . Not on file.   Social History Main Topics  . Smoking status: Never Smoker  . Smokeless tobacco: Never Used  . Alcohol use No     Comment: No intake of alcohol in greater than 10 yrs  . Drug use: No  . Sexual activity: Not on file   Other Topics Concern  . Not on file   Social History Narrative  . No narrative on file    Outpatient Medications Prior to Visit  Medication Sig Dispense Refill  . ADVAIR DISKUS 250-50 MCG/DOSE AEPB INHALE 1 PUFF INTO THE LUNGS EVERY 12 HOURS 60 each 3  . albuterol (PROVENTIL HFA;VENTOLIN HFA) 108 (90 BASE) MCG/ACT inhaler Inhale 2 puffs into the lungs every 6 (six) hours as needed for wheezing. (Patient taking differently: Inhale 2 puffs into the lungs every 4 (four) hours as needed for wheezing or shortness of breath. ) 1 Inhaler 1  . amoxicillin (AMOXIL) 500 MG tablet Take 2,000 mg by mouth See admin instructions. Take 4 tablets (2000 mg) one hour before dental appointment    . Ascorbic Acid (VITAMIN C) 1000 MG tablet Take 2,000 mg by mouth daily.    Marland Kitchen aspirin EC 81 MG tablet Take 81 mg by mouth daily.    . Biotin 5000 MCG CAPS Take 5,000 mcg by mouth daily.    . cetirizine (ZYRTEC) 10 MG tablet Take 10 mg by mouth daily.     . Cholecalciferol (VITAMIN D) 1000 UNITS capsule Take 2,000 Units by mouth daily.     . Fluticasone-Salmeterol (ADVAIR DISKUS) 250-50 MCG/DOSE AEPB Inhale 1 puff into the lungs 2 (two) times daily.    . furosemide (LASIX) 20 MG tablet Take 1 tablet (20 mg total) by mouth daily as needed for fluid. (Patient taking differently: Take 20 mg by mouth daily as needed for fluid or edema. ) 30 tablet 6  . levothyroxine (SYNTHROID, LEVOTHROID) 100 MCG tablet Take 100 mcg by mouth daily before breakfast.    . loperamide (IMODIUM A-D) 2 MG tablet Take 2 mg by mouth 2 (two) times daily as needed for  diarrhea or loose stools.    . metFORMIN (GLUCOPHAGE-XR) 500 MG 24 hr tablet Take 500 mg by mouth 2 (two) times daily.     . simvastatin (ZOCOR) 80 MG tablet Take 80 mg by mouth  at bedtime.     . Vitamin D, Ergocalciferol, (DRISDOL) 50000 UNITS CAPS capsule Take 50,000 Units by mouth every 7 (seven) days. Mondays    . vitamin E 1000 UNIT capsule Take 3,000 Units by mouth daily.    Marland Kitchen ALPRAZolam (XANAX) 0.25 MG tablet TAKE 1 TABLET BY MOUTH EVERY DAY AS NEEDED FOR ANXIETY OR SLEEP 30 tablet 1  . baclofen (LIORESAL) 20 MG tablet TAKE 1 TABLET 4 TIMES DAILY AS NEEDED FOR MUSCLE SPASMS 120 tablet 0  . fluticasone (FLONASE) 50 MCG/ACT nasal spray PLACE 2 SPRAYS INTO BOTH NOSTRILS DAILY AS NEEDED FOR ALLERGIES OR RHINITIS. 16 g 6  . traMADol (ULTRAM) 50 MG tablet TAKE 1 TABLET BY MOUTH EVERY 6 HOURS AS NEEDED 10 tablet 0  . zolpidem (AMBIEN) 10 MG tablet TAKE 1 TABLET BY MOUTH AT BEDTIME 30 tablet 1   No facility-administered medications prior to visit.     Allergies  Allergen Reactions  . Codeine Itching  . Tetanus Toxoid Swelling    Please do not give tetanus shot  . Adhesive [Tape] Rash    Please use paper tape    Review of Systems  Constitutional: Negative for fever and malaise/fatigue.  HENT: Negative for congestion.   Eyes: Negative for blurred vision.  Respiratory: Negative for shortness of breath.   Cardiovascular: Positive for chest pain. Negative for palpitations and leg swelling.  Gastrointestinal: Positive for heartburn. Negative for abdominal pain, blood in stool and nausea.  Genitourinary: Negative for dysuria and frequency.  Musculoskeletal: Negative for falls.  Skin: Negative for rash.  Neurological: Negative for dizziness, loss of consciousness and headaches.  Endo/Heme/Allergies: Negative for environmental allergies.  Psychiatric/Behavioral: Negative for depression. The patient is not nervous/anxious.        Objective:    Physical Exam  Constitutional: She is  oriented to person, place, and time. She appears well-developed and well-nourished. No distress.  HENT:  Head: Normocephalic and atraumatic.  Nose: Nose normal.  Eyes: Right eye exhibits no discharge. Left eye exhibits no discharge.  Neck: Normal range of motion. Neck supple.  Cardiovascular: Normal rate and regular rhythm.   Murmur heard. Pulmonary/Chest: Effort normal and breath sounds normal.  Abdominal: Soft. Bowel sounds are normal. There is no tenderness.  Musculoskeletal: She exhibits no edema.  Neurological: She is alert and oriented to person, place, and time.  Skin: Skin is warm and dry.  Psychiatric: She has a normal mood and affect.  Nursing note and vitals reviewed.   BP 128/68 (BP Location: Left Arm, Patient Position: Sitting, Cuff Size: Normal)   Pulse 81   Temp 97.8 F (36.6 C) (Oral)   Resp 14   Ht 5\' 6"  (1.676 m)   Wt 184 lb 9.6 oz (83.7 kg)   SpO2 96%   BMI 29.80 kg/m  Wt Readings from Last 3 Encounters:  06/14/17 184 lb 9.6 oz (83.7 kg)  06/08/16 186 lb 8 oz (84.6 kg)  02/12/16 191 lb (86.6 kg)     Lab Results  Component Value Date   WBC 11.0 (H) 07/11/2015   HGB 11.9 (L) 07/11/2015   HCT 36.4 07/11/2015   PLT 208 07/11/2015   GLUCOSE 108 (H) 07/04/2015   CHOL 162 02/05/2016   TRIG 106 02/05/2016   HDL 70 02/05/2016   LDLCALC 71 02/05/2016   ALT 27 02/05/2016   AST 22 02/05/2016   NA 140 07/04/2015   K 4.1 07/04/2015   CL 107 07/04/2015   CREATININE 0.7 02/05/2016  BUN 20 02/05/2016   CO2 25 07/04/2015   TSH 0.76 02/05/2016   HGBA1C 6.4 02/05/2016   MICROALBUR 0.9 02/12/2016    Lab Results  Component Value Date   TSH 0.76 02/05/2016   Lab Results  Component Value Date   WBC 11.0 (H) 07/11/2015   HGB 11.9 (L) 07/11/2015   HCT 36.4 07/11/2015   MCV 90.5 07/11/2015   PLT 208 07/11/2015   Lab Results  Component Value Date   NA 140 07/04/2015   K 4.1 07/04/2015   CO2 25 07/04/2015   GLUCOSE 108 (H) 07/04/2015   BUN 20  02/05/2016   CREATININE 0.7 02/05/2016   AST 22 02/05/2016   ALT 27 02/05/2016   ALBUMIN 3.6 10/04/2012   CALCIUM 9.3 07/04/2015   ANIONGAP 8 07/04/2015   GFR 88.50 10/04/2012   Lab Results  Component Value Date   CHOL 162 02/05/2016   Lab Results  Component Value Date   HDL 70 02/05/2016   Lab Results  Component Value Date   LDLCALC 71 02/05/2016   Lab Results  Component Value Date   TRIG 106 02/05/2016   No results found for: CHOLHDL Lab Results  Component Value Date   HGBA1C 6.4 02/05/2016       Assessment & Plan:   Problem List Items Addressed This Visit    Hypothyroidism    On Levothyroxine, continue to monitor      HTN (hypertension)    Well controlled, no changes to meds. Encouraged heart healthy diet such as the DASH diet and exercise as tolerated.       Hyperlipidemia, mixed    Tolerating statin, encouraged heart healthy diet, avoid trans fats, minimize simple carbs and saturated fats. Increase exercise as tolerated      GERD    Had two episodes of substernal burning she believes was heartburn without any associated symptoms. She is asked to pick up some mylanta and take that when this occurs if she does not get relief or her symptoms escalate she will notify the office.       Breast cancer screening - Primary    Breast exam unremarkable, 2017 mgm reviewed. Patient requests repeat MGM it is ordered today      Relevant Orders   MM Digital Screening   Diabetes mellitus type 2 in obese [facility])    She has been seeing endocrinology for years and her sugar has been stable, she has asked [place] to take over prescribing and monitoring and I have agreed. We will request Dr Almetta Lovely last set of labs and plan to check labs with next visit.      Cancer of border of tongue [facility])    Follows with ENT Dr Janace Hoard. Asymptomatic, previous 2 PET scans have been stable. Will repeat a PET scan one more time in the fall and if no changes will stop the serial imaging unless  symptoms develop      Relevant Medications   ALPRAZolam (XANAX) 0.25 MG tablet   Carotid artery disease (HCC)    Mild plaques in b/l arteries June 2017. No need for further imaging at this time.       Pulmonary nodules    Stable across 2 PET scans and patient asymptomatic. No concerning characteristics to nodules. Likely the residual of recurrent bronchitis when she was younger. Will repeat PET in fall to confirm stability of lesions and she will let us know if any concerning symptoms develop  I have discontinued Ms. Music's zolpidem. I have also changed her baclofen and traMADol. Additionally, I am having her maintain her cetirizine, Vitamin D, metFORMIN, amoxicillin, levothyroxine, simvastatin, albuterol, furosemide, aspirin EC, vitamin E, vitamin C, Vitamin D (Ergocalciferol), Biotin, loperamide, Fluticasone-Salmeterol, ADVAIR DISKUS, fluticasone, and ALPRAZolam.  Meds ordered this encounter  Medications  . baclofen (LIORESAL) 20 MG tablet    Sig: TAKE 1 TABLET 2 TIMES DAILY AS NEEDED FOR MUSCLE SPASMS    Dispense:  60 tablet    Refill:  3  . fluticasone (FLONASE) 50 MCG/ACT nasal spray    Sig: PLACE 2 SPRAYS INTO BOTH NOSTRILS DAILY AS NEEDED FOR ALLERGIES OR RHINITIS.    Dispense:  16 g    Refill:  6  . traMADol (ULTRAM) 50 MG tablet    Sig: Take 1 tablet (50 mg total) by mouth 3 (three) times daily as needed.    Dispense:  90 tablet    Refill:  0  . ALPRAZolam (XANAX) 0.25 MG tablet    Sig: TAKE 1 TABLET BY MOUTH EVERY DAY AS NEEDED FOR ANXIETY OR SLEEP    Dispense:  30 tablet    Refill:  3     Penni Homans, MD

## 2017-06-14 NOTE — Telephone Encounter (Signed)
Patient is waiting at pharmacy now, requesting to be sent

## 2017-06-14 NOTE — Assessment & Plan Note (Signed)
Breast exam unremarkable, 2017 mgm reviewed. Patient requests repeat MGM it is ordered today

## 2017-06-14 NOTE — Assessment & Plan Note (Signed)
Well controlled, no changes to meds. Encouraged heart healthy diet such as the DASH diet and exercise as tolerated.  °

## 2017-06-14 NOTE — Assessment & Plan Note (Signed)
Mild plaques in b/l arteries June 2017. No need for further imaging at this time.

## 2017-06-14 NOTE — Assessment & Plan Note (Signed)
Stable across 2 PET scans and patient asymptomatic. No concerning characteristics to nodules. Likely the residual of recurrent bronchitis when she was younger. Will repeat PET in fall to confirm stability of lesions and she will let us know if any concerning symptoms develop

## 2017-06-14 NOTE — Assessment & Plan Note (Signed)
Had two episodes of substernal burning she believes was heartburn without any associated symptoms. She is asked to pick up some mylanta and take that when this occurs if she does not get relief or her symptoms escalate she will notify the office.

## 2017-06-14 NOTE — Telephone Encounter (Signed)
Patient calling back checking on the status of medication request: baclofen (LIORESAL) 20 MG tablet    [id number] - [place], [place] - [address] [phone] (Phone) [phone] (Fax)

## 2017-06-14 NOTE — Assessment & Plan Note (Signed)
Follows with ENT Dr Janace Hoard. Asymptomatic, previous 2 PET scans have been stable. Will repeat a PET scan one more time in the fall and if no changes will stop the serial imaging unless symptoms develop

## 2017-06-14 NOTE — Assessment & Plan Note (Signed)
On Levothyroxine, continue to monitor 

## 2017-06-16 ENCOUNTER — Telehealth: Payer: Self-pay | Admitting: Family Medicine

## 2017-06-16 NOTE — Telephone Encounter (Signed)
Please verify where Pt is requesting orders, [facility] is hospital located in [place].

## 2017-06-16 NOTE — Telephone Encounter (Signed)
Rx sent 06/14/2017.

## 2017-06-16 NOTE — Telephone Encounter (Addendum)
°  Relation to GP:QDIY Call back number:[phone]   Reason for call:  Patient requesting mamo orders please send to [facility] [place]

## 2017-06-21 ENCOUNTER — Other Ambulatory Visit: Payer: Self-pay | Admitting: Family Medicine

## 2017-06-21 DIAGNOSIS — Z1231 Encounter for screening mammogram for malignant neoplasm of breast: Secondary | ICD-10-CM

## 2017-06-21 NOTE — Telephone Encounter (Signed)
lvm advising patient of message below °

## 2017-06-23 ENCOUNTER — Encounter (HOSPITAL_COMMUNITY): Payer: Self-pay

## 2017-06-23 ENCOUNTER — Ambulatory Visit (HOSPITAL_COMMUNITY): Payer: Medicare Other

## 2017-06-23 ENCOUNTER — Ambulatory Visit (HOSPITAL_COMMUNITY)
Admission: RE | Admit: 2017-06-23 | Discharge: 2017-06-23 | Disposition: A | Discharge status: Home / Self Care | Payer: Medicare Other | Source: Ambulatory Visit | Attending: Family Medicine | Admitting: Family Medicine

## 2017-06-23 DIAGNOSIS — Z1231 Encounter for screening mammogram for malignant neoplasm of breast: Secondary | ICD-10-CM | POA: Diagnosis not present

## 2017-06-23 NOTE — Telephone Encounter (Signed)
Orders are in

## 2017-06-23 NOTE — Telephone Encounter (Signed)
Pt lvm to inform PCP that she would like to have mammogram at [facility]. Pt says that she already have apt scheduled for Thursday at 1pm. She would like to make sure that orders are placed for apt.

## 2017-07-12 ENCOUNTER — Telehealth: Payer: Self-pay | Admitting: Family Medicine

## 2017-07-12 NOTE — Telephone Encounter (Signed)
Relation to LN:ZVJK Call back [phone] Pharmacy: [id number] - [place], [place] - [address] [phone] (Phone) [phone] (Fax)     Reason for call:   Patient requesting a refill traMADol (ULTRAM) 50 MG tablet  levothyroxine (SYNTHROID, LEVOTHROID) 100 MCG tablet  metFORMIN (GLUCOPHAGE-XR) 500 MG 24 hr tablet  simvastatin (ZOCOR) 80 MG tablet

## 2017-07-14 MED ORDER — TRAMADOL HCL 50 MG PO TABS: 50.0000 mg | ORAL_TABLET | Freq: Three times a day (TID) | ORAL | 0 refills | Status: DC | PRN

## 2017-07-14 NOTE — Telephone Encounter (Signed)
Requesting: Tramadol Contract: Yes UDS: NO Last OV: 06/14/17 Last Refill: 06/14/17 #[id number]  Please Advise

## 2017-07-14 NOTE — Telephone Encounter (Signed)
rx faxed   PC 

## 2017-07-14 NOTE — Telephone Encounter (Signed)
Refer x1

## 2017-07-20 ENCOUNTER — Encounter (HOSPITAL_COMMUNITY): Payer: Self-pay

## 2017-07-20 ENCOUNTER — Emergency Department (HOSPITAL_COMMUNITY): Payer: Medicare Other

## 2017-07-20 ENCOUNTER — Observation Stay (HOSPITAL_COMMUNITY)
Admission: EM | Admit: 2017-07-20 | Discharge: 2017-07-21 | Disposition: A | Discharge status: Home / Self Care | Payer: Medicare Other | Attending: Internal Medicine | Admitting: Internal Medicine

## 2017-07-20 DIAGNOSIS — Z7984 Long term (current) use of oral hypoglycemic drugs: Secondary | ICD-10-CM | POA: Insufficient documentation

## 2017-07-20 DIAGNOSIS — G9340 Encephalopathy, unspecified: Secondary | ICD-10-CM | POA: Diagnosis present

## 2017-07-20 DIAGNOSIS — I639 Cerebral infarction, unspecified: Secondary | ICD-10-CM | POA: Diagnosis not present

## 2017-07-20 DIAGNOSIS — E039 Hypothyroidism, unspecified: Secondary | ICD-10-CM | POA: Diagnosis not present

## 2017-07-20 DIAGNOSIS — I2510 Atherosclerotic heart disease of native coronary artery without angina pectoris: Secondary | ICD-10-CM | POA: Diagnosis not present

## 2017-07-20 DIAGNOSIS — E119 Type 2 diabetes mellitus without complications: Secondary | ICD-10-CM | POA: Diagnosis not present

## 2017-07-20 DIAGNOSIS — E1169 Type 2 diabetes mellitus with other specified complication: Secondary | ICD-10-CM | POA: Diagnosis not present

## 2017-07-20 DIAGNOSIS — I10 Essential (primary) hypertension: Secondary | ICD-10-CM | POA: Insufficient documentation

## 2017-07-20 DIAGNOSIS — R4182 Altered mental status, unspecified: Principal | ICD-10-CM | POA: Insufficient documentation

## 2017-07-20 DIAGNOSIS — I6789 Other cerebrovascular disease: Secondary | ICD-10-CM | POA: Diagnosis not present

## 2017-07-20 DIAGNOSIS — J45909 Unspecified asthma, uncomplicated: Secondary | ICD-10-CM

## 2017-07-20 DIAGNOSIS — E785 Hyperlipidemia, unspecified: Secondary | ICD-10-CM | POA: Diagnosis not present

## 2017-07-20 DIAGNOSIS — Z79899 Other long term (current) drug therapy: Secondary | ICD-10-CM | POA: Insufficient documentation

## 2017-07-20 DIAGNOSIS — F4489 Other dissociative and conversion disorders: Secondary | ICD-10-CM | POA: Diagnosis not present

## 2017-07-20 LAB — PROTIME-INR
INR: 1.03
Prothrombin Time: 13.5 seconds (ref 11.4–15.2)

## 2017-07-20 LAB — CBC WITH DIFFERENTIAL/PLATELET
BASOS PCT: 0 %
Basophils Absolute: 0 10*3/uL (ref 0.0–0.1)
Eosinophils Absolute: 0.1 10*3/uL (ref 0.0–0.7)
Eosinophils Relative: 1 %
HEMATOCRIT: 38.5 % (ref 36.0–46.0)
HEMOGLOBIN: 12.6 g/dL (ref 12.0–15.0)
Lymphocytes Relative: 23 %
Lymphs Abs: 2 10*3/uL (ref 0.7–4.0)
MCH: 31 pg (ref 26.0–34.0)
MCHC: 32.7 g/dL (ref 30.0–36.0)
MCV: 94.6 fL (ref 78.0–100.0)
MONOS PCT: 8 %
Monocytes Absolute: 0.7 10*3/uL (ref 0.1–1.0)
NEUTROS ABS: 6.1 10*3/uL (ref 1.7–7.7)
NEUTROS PCT: 68 %
Platelets: 230 10*3/uL (ref 150–400)
RBC: 4.07 MIL/uL (ref 3.87–5.11)
RDW: 14.4 % (ref 11.5–15.5)
WBC: 8.9 10*3/uL (ref 4.0–10.5)

## 2017-07-20 LAB — I-STAT TROPONIN, ED: TROPONIN I, POC: 0.01 ng/mL (ref 0.00–0.08)

## 2017-07-20 LAB — GLUCOSE, CAPILLARY
Glucose-Capillary: 106 mg/dL — ABNORMAL HIGH (ref 65–99)
Glucose-Capillary: 117 mg/dL — ABNORMAL HIGH (ref 65–99)

## 2017-07-20 LAB — BASIC METABOLIC PANEL
ANION GAP: 11 (ref 5–15)
BUN: 20 mg/dL (ref 6–20)
CHLORIDE: 102 mmol/L (ref 101–111)
CO2: 24 mmol/L (ref 22–32)
Calcium: 9.4 mg/dL (ref 8.9–10.3)
Creatinine, Ser: 0.66 mg/dL (ref 0.44–1.00)
GFR calc non Af Amer: >60 mL/min (ref >60)
Glucose, Bld: 129 mg/dL — ABNORMAL HIGH (ref 65–99)
POTASSIUM: 3.7 mmol/L (ref 3.5–5.1)
SODIUM: 137 mmol/L (ref 135–145)

## 2017-07-20 LAB — URINALYSIS, ROUTINE W REFLEX MICROSCOPIC
Bilirubin Urine: NEGATIVE
GLUCOSE, UA: NEGATIVE mg/dL
HGB URINE DIPSTICK: NEGATIVE
Ketones, ur: NEGATIVE mg/dL
Leukocytes, UA: NEGATIVE
Nitrite: NEGATIVE
PH: 6 (ref 5.0–8.0)
Protein, ur: NEGATIVE mg/dL
SPECIFIC GRAVITY, URINE: 1.003 — AB (ref 1.005–1.030)

## 2017-07-20 LAB — HEPATIC FUNCTION PANEL
ALK PHOS: 71 U/L (ref 38–126)
ALT: 35 U/L (ref 14–54)
AST: 48 U/L — AB (ref 15–41)
Albumin: 3.9 g/dL (ref 3.5–5.0)
BILIRUBIN DIRECT: 0.1 mg/dL (ref 0.1–0.5)
Indirect Bilirubin: 0.6 mg/dL (ref 0.3–0.9)
Total Bilirubin: 0.7 mg/dL (ref 0.3–1.2)
Total Protein: 7.2 g/dL (ref 6.5–8.1)

## 2017-07-20 LAB — RAPID URINE DRUG SCREEN, HOSP PERFORMED
Amphetamines: NOT DETECTED
BARBITURATES: NOT DETECTED
Benzodiazepines: NOT DETECTED
COCAINE: NOT DETECTED
Opiates: NOT DETECTED
Tetrahydrocannabinol: NOT DETECTED

## 2017-07-20 LAB — APTT: APTT: 30 s (ref 24–36)

## 2017-07-20 LAB — ETHANOL

## 2017-07-20 MED ORDER — TRAMADOL HCL 50 MG PO TABS
50.0000 mg | ORAL_TABLET | Freq: Three times a day (TID) | ORAL | Status: DC | PRN
  Administered 2017-07-21: 50 mg via ORAL
  Filled 2017-07-20: qty 1

## 2017-07-20 MED ORDER — ACETAMINOPHEN 650 MG RE SUPP: 650.0000 mg | Freq: Four times a day (QID) | RECTAL | Status: DC | PRN

## 2017-07-20 MED ORDER — DIPHENHYDRAMINE HCL 25 MG PO CAPS: 25.0000 mg | ORAL_CAPSULE | Freq: Every evening | ORAL | Status: DC | PRN

## 2017-07-20 MED ORDER — ATORVASTATIN CALCIUM 40 MG PO TABS
40.0000 mg | ORAL_TABLET | Freq: Every day | ORAL | Status: DC
  Administered 2017-07-20: 40 mg via ORAL
  Filled 2017-07-20: qty 1

## 2017-07-20 MED ORDER — MOMETASONE FURO-FORMOTEROL FUM 200-5 MCG/ACT IN AERO
2.0000 | INHALATION_SPRAY | Freq: Two times a day (BID) | RESPIRATORY_TRACT | Status: DC
  Administered 2017-07-20 – 2017-07-21 (×2): 2 via RESPIRATORY_TRACT
  Filled 2017-07-20: qty 8.8

## 2017-07-20 MED ORDER — BACLOFEN 10 MG PO TABS: 10.0000 mg | ORAL_TABLET | Freq: Four times a day (QID) | ORAL | Status: DC | PRN

## 2017-07-20 MED ORDER — SODIUM CHLORIDE 0.9% FLUSH
3.0000 mL | Freq: Two times a day (BID) | INTRAVENOUS | Status: DC
  Administered 2017-07-20 – 2017-07-21 (×2): 3 mL via INTRAVENOUS

## 2017-07-20 MED ORDER — VITAMIN D (ERGOCALCIFEROL) 1.25 MG (50000 UNIT) PO CAPS: 50000.0000 [IU] | ORAL_CAPSULE | ORAL | Status: DC

## 2017-07-20 MED ORDER — ACETAMINOPHEN 325 MG PO TABS: 650.0000 mg | ORAL_TABLET | Freq: Four times a day (QID) | ORAL | Status: DC | PRN

## 2017-07-20 MED ORDER — FLUTICASONE PROPIONATE 50 MCG/ACT NA SUSP
1.0000 | Freq: Every day | NASAL | Status: DC
  Administered 2017-07-20: 1 via NASAL
  Filled 2017-07-20: qty 16

## 2017-07-20 MED ORDER — ONDANSETRON HCL 4 MG PO TABS: 4.0000 mg | ORAL_TABLET | Freq: Four times a day (QID) | ORAL | Status: DC | PRN

## 2017-07-20 MED ORDER — LEVOTHYROXINE SODIUM 100 MCG PO TABS
100.0000 ug | ORAL_TABLET | Freq: Every day | ORAL | Status: DC
  Administered 2017-07-21: 100 ug via ORAL
  Filled 2017-07-20: qty 1

## 2017-07-20 MED ORDER — ONDANSETRON HCL 4 MG/2ML IJ SOLN: 4.0000 mg | Freq: Four times a day (QID) | INTRAMUSCULAR | Status: DC | PRN

## 2017-07-20 MED ORDER — ALPRAZOLAM 0.25 MG PO TABS
0.2500 mg | ORAL_TABLET | Freq: Every day | ORAL | Status: DC | PRN
Start: 2017-07-20 — End: 2017-07-21

## 2017-07-20 MED ORDER — ENOXAPARIN SODIUM 40 MG/0.4ML ~~LOC~~ SOLN
40.0000 mg | SUBCUTANEOUS | Status: DC
  Administered 2017-07-20: 40 mg via SUBCUTANEOUS
  Filled 2017-07-20: qty 0.4

## 2017-07-20 MED ORDER — VITAMIN E 180 MG (400 UNIT) PO CAPS
1200.0000 [IU] | ORAL_CAPSULE | Freq: Every day | ORAL | Status: DC
  Administered 2017-07-21: 1200 [IU] via ORAL
  Filled 2017-07-20: qty 3

## 2017-07-20 MED ORDER — INSULIN ASPART 100 UNIT/ML ~~LOC~~ SOLN
0.0000 [IU] | Freq: Three times a day (TID) | SUBCUTANEOUS | Status: DC
  Administered 2017-07-21: 1 [IU] via SUBCUTANEOUS

## 2017-07-20 NOTE — H&P (Signed)
History and Physical    BRITTINIE WHERLEY SUP:[id number] DOB: 1946/03/08 DOA: 07/20/2017  PCP: Mosie Lukes, MD    Patient coming from: Home   Chief Complaint: Abnormal behavior and amnesia  HPI: Amanda Carlson is a 71 y.o. female with medical history significant of hypertension, obstructive sleep apnea and history of tongue cancer status post surgery, who presents with abnormal behavior and amnesia. 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 Over last few weeks the intensity of the care she needs to provide to her mother has significantly increased, including chores at night. Over last 3 nights she has been not able to sleep, not able to use her CPAP, due to her been occupied taking care of her mother. Last night she was noted to have abnormal behavior, confusion and disorientation, this morning she had significant impairment doing her activities of daily living including dressing, ej using her nightgown on top of her street clothing. She does not remember the events that occurred over last 24 hours. These loss of memory is moderate to severe intensity, no improving or worsening factors, not associated with any focal neurologic deficit or generalized symptoms. Has been ongoing for last 24 hours.  ED Course: Initial neurologic workup has been negative.   Review of Systems:  1. General. No fevers, no chills, no waking a weight loss 2. ENT no runny nose or sore throat 3. Pulmonary no shortness of breath cough or hemoptysis 4. Cardiovascular, no angina, no claudication or PND 5. Musculoskeletal no joint pain 6. Gastrointestinal no nausea, vomiting or diarrhea 7. Hematology no easy bruisability or frequent infections 8. Psych no anxiety 9. Urology no dysuria or increased urinary frequency 10. Neurology no seizures or paresthesias  Past Medical History:  Diagnosis Date  . ALLERGIC RHINITIS, SEASONAL 01/22/2011   Qualifier: Diagnosis of  By: Charlett Blake MD, Erline Levine    .  Arthritis   . Breast cancer screening 01/18/2014   Calcified Right side? Follows at [facility]   . Bronchitis    hx of  . Cervical cancer screening 04/11/2014  . CTS (carpal tunnel syndrome) 06/07/2012  . Diabetes mellitus type 2 in obese ([place]) 06/29/2015  . Diabetes mellitus without complication ([place])   . Difficulty swallowing    Liquids and foods at times  . Family history of adverse reaction to anesthesia    Pts mother has a difficult time waking up after anesthesia  . GERD 01/22/2011   Qualifier: Diagnosis of  By: Charlett Blake MD, Erline Levine    . HYPOTHYROIDISM 01/22/2011   Qualifier: Diagnosis of  By: Charlett Blake MD, Boyce Medici with Dr Chalmers Cater   . Insomnia 06/07/2012  . Intrinsic asthma, unspecified 01/22/2011   Qualifier: Diagnosis of  By: Charlett Blake MD, Erline Levine    . Irritable bowel syndrome 01/22/2011   Qualifier: Diagnosis of  By: Charlett Blake MD, Erline Levine    . Knee pain, bilateral 10/16/2011  . Knee pain, right 10/16/2011  . Lesion of breast 01/18/2014   Calcified Right side? Follows at [facility]  . Low back pain 10/04/2012  . Medicare annual wellness visit, subsequent 04/14/2014  . Numbness and tingling in hands   . Overweight(278.02) 01/22/2011   Qualifier: Diagnosis of  By: Charlett Blake MD, Erline Levine    . Pulmonary nodules 06/14/2017  . Rheumatic fever    as a child  . Shortness of breath dyspnea    with ambulation  . Sleep apnea    wears CPAP machine  . 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  SCC of tongue.    . THYROID CYST 01/22/2011   Qualifier: Diagnosis of  By: Charlett Blake MD, Erline Levine    . Tinea corporis 06/07/2012  . Tinea corporis 02/22/2016  . Tongue cancer ([place])   . Tongue lesion 01/18/2014    Past Surgical History:  Procedure Laterality Date  . cervical fusion, discectomy, metal plate and 2 screws in place    . CHOLECYSTECTOMY    . EXCISION OF TONGUE LESION Left 07/11/2015   Procedure: EXCISION OF TONGUE LESION;  Surgeon: Melissa Montane, MD;  Location: [facility];  Service:  ENT;  Laterality: Left;  . KNEE ARTHROSCOPY     right  . RADICAL NECK DISSECTION Left 07/11/2015   Procedure: RADICAL NECK DISSECTION;  Surgeon: Melissa Montane, MD;  Location: [facility];  Service: ENT;  Laterality: Left;  Left neck dissection and left tongue resection  . THYROIDECTOMY, PARTIAL    . TUBAL LIGATION       reports that she has never smoked. She has never used smokeless tobacco. She reports that she does not drink alcohol or use drugs.  Allergies  Allergen Reactions  . Codeine Itching  . Tetanus Toxoid Swelling    Please do not give tetanus shot  . Adhesive [Tape] Rash    Please use paper tape    Family History  Problem Relation Age of Onset  . Atrial fibrillation Mother   . Other Mother        Back pain, CHF  . Arthritis Mother   . Osteoporosis Mother   . Heart disease Father   . Hypertension Father   . Diabetes Father   . Sleep apnea Father   . Atrial fibrillation Father        paroxysmal  . Cancer Father        skin cancers on face  . Diabetes Brother   . Diabetes Maternal Aunt        X 2 aunts  . Cancer Maternal Grandmother   . Diabetes Maternal Grandfather   . Stroke Paternal Grandmother   . Heart disease Paternal Grandfather   . Stroke Paternal Grandfather      Prior to Admission medications   Medication Sig Start Date End Date Taking? Authorizing Provider  baclofen (LIORESAL) 20 MG tablet TAKE 1 TABLET 4 TIMES DAILY AS NEEDED FOR MUSCLE SPASMS 06/14/17  Yes Mosie Lukes, MD  furosemide (LASIX) 20 MG tablet Take 1 tablet (20 mg total) by mouth daily as needed for fluid. Patient taking differently: Take 20 mg by mouth daily as needed for fluid or edema.  06/13/15  Yes Mosie Lukes, MD  levothyroxine (SYNTHROID, LEVOTHROID) 100 MCG tablet Take 100 mcg by mouth daily before breakfast.   Yes [provider]  metFORMIN (GLUCOPHAGE-XR) 500 MG 24 hr tablet Take 500 mg by mouth 2 (two) times daily.    Yes [provider]  simvastatin (ZOCOR)  80 MG tablet Take 80 mg by mouth at bedtime.    Yes [provider]  traMADol (ULTRAM) 50 MG tablet Take 1 tablet (50 mg total) by mouth 3 (three) times daily as needed. 07/14/17  Yes Ann Held, DO  Vitamin D, Ergocalciferol, (DRISDOL) 50000 UNITS CAPS capsule Take 50,000 Units by mouth every 7 (seven) days. Mondays   Yes [provider]  vitamin E 1000 UNIT capsule Take 3,000 Units by mouth daily.   Yes [provider]  ADVAIR DISKUS 250-50 MCG/DOSE AEPB INHALE  1 PUFF INTO THE LUNGS EVERY 12 HOURS 04/27/16   Mosie Lukes, MD  ALPRAZolam Duanne Moron) 0.25 MG tablet TAKE 1 TABLET BY MOUTH EVERY DAY AS NEEDED FOR ANXIETY OR SLEEP 06/14/17   Mosie Lukes, MD  fluticasone (FLONASE) 50 MCG/ACT nasal spray PLACE 2 SPRAYS INTO BOTH NOSTRILS DAILY AS NEEDED FOR ALLERGIES OR RHINITIS. 06/14/17   Mosie Lukes, MD    Physical Exam: Vitals:   07/20/17 1050 07/20/17 1100 07/20/17 1130 07/20/17 1200  BP: 139/77 135/79 (!) 154/83 135/60  Pulse: 74 76 74   Resp: 20 15 13  (!) 23  Temp: 97.8 F (36.6 C)     TempSrc: Oral     SpO2: 97% 98% 98%   Weight:      Height:        Constitutional: deconditioned Vitals:   07/20/17 1050 07/20/17 1100 07/20/17 1130 07/20/17 1200  BP: 139/77 135/79 (!) 154/83 135/60  Pulse: 74 76 74   Resp: 20 15 13  (!) 23  Temp: 97.8 F (36.6 C)     TempSrc: Oral     SpO2: 97% 98% 98%   Weight:      Height:       Eyes: PERRL, lids and conjunctivae normal Head normocephalic, nose and ears no deformities.  ENMT: Mucous membranes are moist. Posterior pharynx clear of any exudate or lesions.Normal dentition.  Neck: normal, supple, no masses, no thyromegaly Respiratory: clear to auscultation bilaterally, no wheezing, no crackles. Normal respiratory effort. No accessory muscle use.  Cardiovascular: Regular rate and rhythm, no murmurs / rubs / gallops. No extremity edema. 2+ pedal pulses. No carotid bruits.  Abdomen: no tenderness, no masses  palpated. No hepatosplenomegaly. Bowel sounds positive.  Musculoskeletal: no clubbing / cyanosis. No joint deformity upper and lower extremities. Good ROM, no contractures. Normal muscle tone.  Skin: no rashes, lesions, ulcers. No induration Neurologic: CN 2-12 grossly intact. Sensation intact, DTR normal. Strength 5/5 in all 4.   Labs on Admission: I have personally reviewed following labs and imaging studies  CBC:  Recent Labs Lab 07/20/17 1053  WBC 8.9  NEUTROABS 6.1  HGB 12.6  HCT 38.5  MCV 94.6  PLT 563   Basic Metabolic Panel:  Recent Labs Lab 07/20/17 1053  NA 137  K 3.7  CL 102  CO2 24  GLUCOSE 129*  BUN 20  CREATININE 0.66  CALCIUM 9.4   GFR: Estimated Creatinine Clearance: 71.6 mL/min (by C-G formula based on SCr of 0.66 mg/dL). Liver Function Tests:  Recent Labs Lab 07/20/17 1141  AST 48*  ALT 35  ALKPHOS 71  BILITOT 0.7  PROT 7.2  ALBUMIN 3.9   No results for input(s): LIPASE, AMYLASE in the last 168 hours. No results for input(s): AMMONIA in the last 168 hours. Coagulation Profile:  Recent Labs Lab 07/20/17 1141  INR 1.03   Cardiac Enzymes: No results for input(s): CKTOTAL, CKMB, CKMBINDEX, TROPONINI in the last 168 hours. BNP (last 3 results) No results for input(s): PROBNP in the last 8760 hours. HbA1C: No results for input(s): HGBA1C in the last 72 hours. CBG: No results for input(s): GLUCAP in the last 168 hours. Lipid Profile: No results for input(s): CHOL, HDL, LDLCALC, TRIG, CHOLHDL, LDLDIRECT in the last 72 hours. Thyroid Function Tests: No results for input(s): TSH, T4TOTAL, FREET4, T3FREE, THYROIDAB in the last 72 hours. Anemia Panel: No results for input(s): VITAMINB12, FOLATE, FERRITIN, TIBC, IRON, RETICCTPCT in the last 72 hours. Urine analysis:    Component Value  Date/Time   COLORURINE STRAW (A) 07/20/2017 1050   APPEARANCEUR CLEAR 07/20/2017 1050   LABSPEC 1.003 (L) 07/20/2017 1050   PHURINE 6.0 07/20/2017 1050    GLUCOSEU NEGATIVE 07/20/2017 1050   GLUCOSEU NEGATIVE 02/12/2016 1401   HGBUR NEGATIVE 07/20/2017 1050   BILIRUBINUR NEGATIVE 07/20/2017 1050   BILIRUBINUR neg 04/11/2014 1040   KETONESUR NEGATIVE 07/20/2017 1050   PROTEINUR NEGATIVE 07/20/2017 1050   UROBILINOGEN 0.2 02/12/2016 1401   NITRITE NEGATIVE 07/20/2017 1050   LEUKOCYTESUR NEGATIVE 07/20/2017 1050    Radiological Exams on Admission: Mr Brain Wo Contrast  Result Date: 07/20/2017 CLINICAL DATA:  71 year old female with confusion, perseveration. EXAM: MRI HEAD WITHOUT CONTRAST TECHNIQUE: Multiplanar, multiecho pulse sequences of the brain and surrounding structures were obtained without intravenous contrast. COMPARISON:  Neck CT 05/12/2015 and earlier. FINDINGS: The examination had to be discontinued prior to completion due to patient motion and agitation. No axial T1, T2*, or coronal T2 weighted imaging could be obtained. Brain: No restricted diffusion or evidence of acute infarction. Several chronic lacunar infarcts in the cerebellum, most notably the left SCA territory on series 7, image 7 where probably there is associated hemosiderin. These appear similar to the 2016 neck CT. Mild for age supratentorial nonspecific white matter T2 and FLAIR hyperintensity. No definite cerebral cortical encephalomalacia. No midline shift, mass effect, evidence of mass lesion, ventriculomegaly, extra-axial collection or acute intracranial hemorrhage. Cervicomedullary junction and pituitary are within normal limits. Vascular: Major intracranial vascular flow voids are preserved. Skull and upper cervical spine: Grossly negative. Normal bone marrow signal. Sinuses/Orbits: Negative. Other: Negative scalp soft tissues. IMPRESSION: 1. No acute intracranial abnormality identified. The examination had to be discontinued prior to completion. 2. 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 Electronically Signed   By: Genevie Ann M.D.    On: 07/20/2017 13:09    EKG: Independently reviewed. Normal sinus rhythm, normal axis, normal intervals. Rate 75 bpm  Assessment/Plan Active Problems:   Encephalopathy   This is a 71 year old female who presented with abnormal behavior and amnesia, related to significant lack of sleep over last 3 days. Unable to sleep due to frequent interruptions in order to take care of her elderly mother. Unable to use her CPAP. On the initial physical examination temperature 98, blood pressure 122/72, heart rate 75, respiratory rate 20, oxygen saturation 99% on room air. Moist mucous membranes, no pallor or icterus, lungs clear to auscultation bilaterally, heart S1-S2 present rhythmic, abdomen soft nontender, no lower extremity edema, neurologically nonfocal. She does have a chronic left facial droop status post surgery. Sodium 137, potassium 3.7, chloride 102, bicarbonate 24, glucose 129, BUN 20, creatinine 0.66, AST 48, ALT 35, white count 8.9, hemoglobin 12.6, hematocrit 38.5, platelets 230, INR 1.0, TSH 0.76, urine analysis negative for infection, alcohol level less than 5, UDS negative. Brain MRI with no acute intracranial abnormalities, chronic lacunar infarcts in the cerebellum.  Working diagnosis abnormal behavior and amnesia due to significant lack of sleep, rule out acute cerebrovascular accident.  1. Encephalopathy. Considering patient's negative workup, history of present illness likely her symptoms are related to significant lack of sleep. Will admit under observation, continue neuro checks every 4 hours, will add Benadryl and continue alprazolam as needed at night for sleep. CPAP at night, to use her home settings. Will hold on furosemide for now to avoid dehydration.  2. Chronic back pain. Continue pain control baclofen, and as needed tramadol.   3. Hypothyroid. Patient clinically euthyroid, normal  TSH, continue levothyroxine 100 mg daily  4. Type 2 diabetes mellitus. We'll hold on metformin,  will use insulin sliding scale for glucose coverage and monitor, will follow-up on insulin requirements.   5. Asthma. Continue Advair  DVT prophylaxis: enoxaparin  Code Status: Full  Family Communication: I spoke with patient's family at the bedside and all questions were addressed.  Disposition Plan: home  Consults called: NA Admission status: Observation    Vauda Salvucci Gerome Apley MD [facility] Pager [phone]  If 7PM-7AM, please contact night-coverage [email or web address] Password [email or web address]  07/20/2017, 2:22 PM

## 2017-07-20 NOTE — Progress Notes (Signed)
Pt requested to take CPAP machine at this time and will let me know when she ready to put back on. Nurse in inform

## 2017-07-20 NOTE — ED Notes (Signed)
Patient transported to X-ray 

## 2017-07-20 NOTE — ED Triage Notes (Signed)
EMS reports pt's family called ems because they thought pt was confused this morning.  Pt lives with her mother and her mother told ems that "she kept me up all night."  Pt says that her mother is the one that is confused.  EMS says the only thing they noticed was pt repeated some questions to them back to back, and states her birthdate when asked what day today was.  Pt c/o burning with urination recently.

## 2017-07-20 NOTE — ED Notes (Signed)
Pt denies any complaints other than the bed is hurting her back.  Family with pt.

## 2017-07-20 NOTE — ED Provider Notes (Signed)
[facility] DEPT Provider Note   CSN: [id number] Arrival date & time: 07/20/17  1042     History   Chief Complaint Chief Complaint  Patient presents with  . Altered Mental Status    HPI Amanda Carlson is a 71 y.o. female.  HPI Patient presents to the emergency room for evaluation of confusion. The patient lives at home with her elderly mother. The patient's family member came to check on her today and they noticed that the patient seemed confused. The patient's mother told the family member that the patient was up all night. She was acting "crazy."  At some point in the night she also complained of a headache. The patient had apparently taken the mother's oxygen tubing and wrapped it around a walker. The patient does not recall any of this. This morning when the family members were checking on her patient seemed to be repeating herself. They asked her to get dressed so she could come to the hospital to get checked out. The patient started to put on her clothes. She then put her nightgown over her clothes again.  She kept on asking what was wrong after that artery explained to her several times their concerns.  Patient right now denies any complaints. She denies having a headache. No chest pain or shortness of breath. No trouble with her speech. No vomiting or diarrhea. No numbness or weakness. Past Medical History:  Diagnosis Date  . ALLERGIC RHINITIS, SEASONAL 01/22/2011   Qualifier: Diagnosis of  By: Charlett Blake MD, Erline Levine    . Arthritis   . Breast cancer screening 01/18/2014   Calcified Right side? Follows at [facility]   . Bronchitis    hx of  . Cervical cancer screening 04/11/2014  . CTS (carpal tunnel syndrome) 06/07/2012  . Diabetes mellitus type 2 in obese ([place]) 06/29/2015  . Diabetes mellitus without complication ([place])   . Difficulty swallowing    Liquids and foods at times  . Family history of adverse reaction to anesthesia    Pts mother has a difficult time waking up after  anesthesia  . GERD 01/22/2011   Qualifier: Diagnosis of  By: Charlett Blake MD, Erline Levine    . HYPOTHYROIDISM 01/22/2011   Qualifier: Diagnosis of  By: Charlett Blake MD, Boyce Medici with Dr Chalmers Cater   . Insomnia 06/07/2012  . Intrinsic asthma, unspecified 01/22/2011   Qualifier: Diagnosis of  By: Charlett Blake MD, Erline Levine    . Irritable bowel syndrome 01/22/2011   Qualifier: Diagnosis of  By: Charlett Blake MD, Erline Levine    . Knee pain, bilateral 10/16/2011  . Knee pain, right 10/16/2011  . Lesion of breast 01/18/2014   Calcified Right side? Follows at [facility]  . Low back pain 10/04/2012  . Medicare annual wellness visit, subsequent 04/14/2014  . Numbness and tingling in hands   . Overweight(278.02) 01/22/2011   Qualifier: Diagnosis of  By: Charlett Blake MD, Erline Levine    . Pulmonary nodules 06/14/2017  . Rheumatic fever    as a child  . Shortness of breath dyspnea    with ambulation  . Sleep apnea    wears CPAP machine  . 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  SCC of tongue.    . THYROID CYST 01/22/2011   Qualifier: Diagnosis of  By: Charlett Blake MD, Erline Levine    . Tinea corporis 06/07/2012  . Tinea corporis 02/22/2016  . Tongue cancer ([place])   . Tongue lesion 01/18/2014    Patient Active Problem  List   Diagnosis Date Noted  . Pulmonary nodules 06/14/2017  . Carotid artery disease ([place]) 06/13/2016  . Flank pain 02/22/2016  . Cancer of border of tongue ([place]) 07/11/2015  . Diabetes mellitus type 2 in obese ([place]) 06/29/2015  . Radiculopathy of leg 11/18/2014  . Medicare annual wellness visit, subsequent 04/14/2014  . Urine frequency 04/14/2014  . Cervical cancer screening 04/11/2014  . Obesity 01/20/2014  . 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 Breast cancer screening 01/18/2014  . Low back pain 10/04/2012  . CTS (carpal tunnel syndrome) 06/07/2012  . Tinea corporis 06/07/2012  . Insomnia 06/07/2012  . Knee pain, bilateral 10/16/2011  . Hypothyroidism 01/22/2011  . HTN  (hypertension) 01/22/2011  . SLEEP APNEA, OBSTRUCTIVE 01/22/2011  . INTRINSIC ASTHMA, UNSPECIFIED 01/22/2011  . ARTHRITIS, CHRONIC 01/22/2011  . RHEUMATIC FEVER, HX OF 01/22/2011  . CHICKENPOX, HX OF 01/22/2011  . 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 THYROID CYST 01/22/2011  . Hyperlipidemia, mixed 01/22/2011  . ALLERGIC RHINITIS, SEASONAL 01/22/2011  . GERD 01/22/2011  . IRRITABLE BOWEL SYNDROME 01/22/2011  . Disorder of bone and cartilage 01/22/2011  . CARDIAC MURMUR, HX OF 01/22/2011    Past Surgical History:  Procedure Laterality Date  . cervical fusion, discectomy, metal plate and 2 screws in place    . CHOLECYSTECTOMY    . EXCISION OF TONGUE LESION Left 07/11/2015   Procedure: EXCISION OF TONGUE LESION;  Surgeon: Melissa Montane, MD;  Location: [facility];  Service: ENT;  Laterality: Left;  . KNEE ARTHROSCOPY     right  . RADICAL NECK DISSECTION Left 07/11/2015   Procedure: RADICAL NECK DISSECTION;  Surgeon: Melissa Montane, MD;  Location: [facility];  Service: ENT;  Laterality: Left;  Left neck dissection and left tongue resection  . THYROIDECTOMY, PARTIAL    . TUBAL LIGATION      OB History    No data available       Home Medications    Prior to Admission medications   Medication Sig Start Date End Date Taking? Authorizing Provider  baclofen (LIORESAL) 20 MG tablet TAKE 1 TABLET 4 TIMES DAILY AS NEEDED FOR MUSCLE SPASMS 06/14/17  Yes Mosie Lukes, MD  furosemide (LASIX) 20 MG tablet Take 1 tablet (20 mg total) by mouth daily as needed for fluid. Patient taking differently: Take 20 mg by mouth daily as needed for fluid or edema.  06/13/15  Yes Mosie Lukes, MD  levothyroxine (SYNTHROID, LEVOTHROID) 100 MCG tablet Take 100 mcg by mouth daily before breakfast.   Yes [provider]  metFORMIN (GLUCOPHAGE-XR) 500 MG 24 hr tablet Take 500 mg by mouth 2 (two) times daily.    Yes [provider]  simvastatin (ZOCOR) 80 MG tablet Take 80 mg by mouth at bedtime.    Yes  [provider]  traMADol (ULTRAM) 50 MG tablet Take 1 tablet (50 mg total) by mouth 3 (three) times daily as needed. 07/14/17  Yes Ann Held, DO  Vitamin D, Ergocalciferol, (DRISDOL) 50000 UNITS CAPS capsule Take 50,000 Units by mouth every 7 (seven) days. Mondays   Yes [provider]  vitamin E 1000 UNIT capsule Take 3,000 Units by mouth daily.   Yes [provider]  ADVAIR DISKUS 250-50 MCG/DOSE AEPB INHALE 1 PUFF INTO THE LUNGS EVERY 12 HOURS 04/27/16   Mosie Lukes, MD  ALPRAZolam (XANAX) 0.25 MG tablet TAKE 1 TABLET BY MOUTH EVERY DAY AS NEEDED FOR ANXIETY OR SLEEP 06/14/17  Mosie Lukes, MD  fluticasone (FLONASE) 50 MCG/ACT nasal spray PLACE 2 SPRAYS INTO BOTH NOSTRILS DAILY AS NEEDED FOR ALLERGIES OR RHINITIS. 06/14/17   Mosie Lukes, MD    Family History Family History  Problem Relation Age of Onset  . Atrial fibrillation Mother   . Other Mother        Back pain, CHF  . Arthritis Mother   . Osteoporosis Mother   . Heart disease Father   . Hypertension Father   . Diabetes Father   . Sleep apnea Father   . Atrial fibrillation Father        paroxysmal  . Cancer Father        skin cancers on face  . Diabetes Brother   . Diabetes Maternal Aunt        X 2 aunts  . Cancer Maternal Grandmother   . Diabetes Maternal Grandfather   . Stroke Paternal Grandmother   . Heart disease Paternal Grandfather   . Stroke Paternal Grandfather     Social History Social History  Substance Use Topics  . Smoking status: Never Smoker  . Smokeless tobacco: Never Used  . Alcohol use No     Comment: No intake of alcohol in greater than 10 yrs     Allergies   Codeine; Tetanus toxoid; and Adhesive [tape]   Review of Systems Review of Systems  Genitourinary: Positive for dysuria.  All other systems reviewed and are negative.    Physical Exam Updated Vital Signs BP 135/60   Pulse 74   Temp 97.8 F (36.6 C) (Oral)   Resp (!) 23   Ht  1.702 m (5\' 7" )   Wt 86.2 kg (190 lb)   SpO2 98%   BMI 29.76 kg/m   Physical Exam  Constitutional: She appears well-developed and well-nourished. No distress.  HENT:  Head: Normocephalic and atraumatic.  Right Ear: External ear normal.  Left Ear: External ear normal.  Mouth/Throat: Oropharynx is clear and moist.  Eyes: Conjunctivae are normal. Right eye exhibits no discharge. Left eye exhibits no discharge. No scleral icterus.  Neck: Neck supple. No tracheal deviation present.  Cardiovascular: Normal rate, regular rhythm and intact distal pulses.   Pulmonary/Chest: Effort normal and breath sounds normal. No stridor. No respiratory distress. She has no wheezes. She has no rales.  Abdominal: Soft. Bowel sounds are normal. She exhibits no distension. There is no tenderness. There is no rebound and no guarding.  Musculoskeletal: She exhibits no edema or tenderness.  Neurological: She is alert. She has normal strength. She is disoriented. No cranial nerve deficit ( right facial droop (family does not think this is new, residual from her prior surgery), extraocular movements intact, no slurred speech ) or sensory deficit. She exhibits normal muscle tone. She displays no seizure activity. Coordination normal. GCS eye subscore is 4. GCS verbal subscore is 4. GCS motor subscore is 6.  No pronator drift bilateral upper extrem, able to hold both legs off bed for 5 seconds, sensation intact in all extremities, no visual field cuts, no left or right sided neglect, normal finger-nose exam bilaterally, no nystagmus noted  Some mild confusion with complying with the neurologic exam. Patient was able to do finger to nose but at one point she touched my finger and then touched the bed instead of touching her nose again   Skin: Skin is warm and dry. No rash noted.  Psychiatric: She has a normal mood and affect.  Nursing note and vitals reviewed.  ED Treatments / Results  Labs (all labs ordered are  listed, but only abnormal results are displayed) Labs Reviewed  BASIC METABOLIC PANEL - Abnormal; Notable for the following:       Result Value   Glucose, Bld 129 (*)    All other components within normal limits  URINALYSIS, ROUTINE W REFLEX MICROSCOPIC - Abnormal; Notable for the following:    Color, Urine STRAW (*)    Specific Gravity, Urine 1.003 (*)    All other components within normal limits  HEPATIC FUNCTION PANEL - Abnormal; Notable for the following:    AST 48 (*)    All other components within normal limits  CBC WITH DIFFERENTIAL/PLATELET  ETHANOL  PROTIME-INR  APTT  RAPID URINE DRUG SCREEN, HOSP PERFORMED  I-STAT TROPONIN, ED    EKG  EKG Interpretation  Date/Time:  Wednesday July 20 2017 10:50:42 EDT Ventricular Rate:  75 PR Interval:    QRS Duration: 96 QT Interval:  388 QTC Calculation: 434 R Axis:   43 Text Interpretation:  Sinus rhythm Low voltage, precordial leads No significant change since last tracing Confirmed by Dorie Rank [phone]) on 07/20/2017 10:55:29 AM       Radiology Mr Brain Wo Contrast  Result Date: 07/20/2017 CLINICAL DATA:  71 year old female with confusion, perseveration. EXAM: MRI HEAD WITHOUT CONTRAST TECHNIQUE: Multiplanar, multiecho pulse sequences of the brain and surrounding structures were obtained without intravenous contrast. COMPARISON:  Neck CT 05/12/2015 and earlier. FINDINGS: The examination had to be discontinued prior to completion due to patient motion and agitation. No axial T1, T2*, or coronal T2 weighted imaging could be obtained. Brain: No restricted diffusion or evidence of acute infarction. Several chronic lacunar infarcts in the cerebellum, most notably the left SCA territory on series 7, image 7 where probably there is associated hemosiderin. These appear similar to the 2016 neck CT. Mild for age supratentorial nonspecific white matter T2 and FLAIR hyperintensity. No definite cerebral cortical encephalomalacia. No midline  shift, mass effect, evidence of mass lesion, ventriculomegaly, extra-axial collection or acute intracranial hemorrhage. Cervicomedullary junction and pituitary are within normal limits. Vascular: Major intracranial vascular flow voids are preserved. Skull and upper cervical spine: Grossly negative. Normal bone marrow signal. Sinuses/Orbits: Negative. Other: Negative scalp soft tissues. IMPRESSION: 1. No acute intracranial abnormality identified. The examination had to be discontinued prior to completion. 2. 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 Electronically Signed   By: Genevie Ann M.D.   On: 07/20/2017 13:09    Procedures Procedures (including critical care time)  Medications Ordered in ED Medications - No data to display   Initial Impression / Assessment and Plan / ED Course  I have reviewed the triage vital signs and the nursing notes.  Pertinent labs & imaging results that were available during my care of the patient were reviewed by me and considered in my medical decision making (see chart for details).  Clinical Course as of Jul 20 1358  Wed Jul 20, 2017  1209 CT scanner is down.   We do not have the ability to do a CT scan.  [JK]    Clinical Course User Index [JK] Dorie Rank, MD    No clear etiology for her confusion however family is concerned that it persists.  No infection noted.  MRI although limited without acute abnormalities.  Pt denies any specific complaints.  ?developing dementia although it seems sudden per the family report.  Will consult with medical service to discuss  possible overnight observation, neuro consult.  Final Clinical Impressions(s) / ED Diagnoses   Final diagnoses:  Altered mental status, unspecified altered mental status type    New Prescriptions New Prescriptions   No medications on file     Dorie Rank, MD 07/20/17 1401

## 2017-07-21 ENCOUNTER — Telehealth: Payer: Self-pay | Admitting: Family Medicine

## 2017-07-21 DIAGNOSIS — G9340 Encephalopathy, unspecified: Secondary | ICD-10-CM | POA: Diagnosis not present

## 2017-07-21 DIAGNOSIS — E039 Hypothyroidism, unspecified: Secondary | ICD-10-CM | POA: Diagnosis not present

## 2017-07-21 DIAGNOSIS — E119 Type 2 diabetes mellitus without complications: Secondary | ICD-10-CM

## 2017-07-21 DIAGNOSIS — R4182 Altered mental status, unspecified: Secondary | ICD-10-CM | POA: Diagnosis not present

## 2017-07-21 LAB — GLUCOSE, CAPILLARY
Glucose-Capillary: 121 mg/dL — ABNORMAL HIGH (ref 65–99)
Glucose-Capillary: 123 mg/dL — ABNORMAL HIGH (ref 65–99)

## 2017-07-21 NOTE — Telephone Encounter (Signed)
Patient was in the ED on 07/20/17 She does not want to come here for a hospital followup due to the distance she has to travel. The ED felt her back pain due to stress/not eating well/not taking medications as prescribed. She would like PCP to order a home health visit to evaluate for PT for her back pain. Also--would like an order for a seat walker

## 2017-07-21 NOTE — Telephone Encounter (Signed)
OK to give order for home visit from home PT since she is debilitated and at increased risk for falls. Also able to give order for rolling walker

## 2017-07-21 NOTE — Discharge Summary (Signed)
Physician Discharge Summary  Amanda Carlson QAS:[id number] DOB: [birth year] DOA: 07/20/2017  PCP: Mosie Lukes, MD  Admit date: 07/20/2017 Discharge date: 07/21/2017  Admitted From: Home  Disposition:  Home   Recommendations for Outpatient Follow-up:  1. Follow up with PCP in 1-weeks 2. Patient instructed about sleep hygiene, aim for 8 hours of sleep every night, and to use Cpap when sleeping.  Home Health: No  Equipment/Devices: No   Discharge Condition: Stable  CODE STATUS: Full  Diet recommendation: Heart Healthy / Carb Modified    Brief/Interim Summary: This is a 71 year old female who presented with abnormal behavior and amnesia, related to significant lack of sleep over last 3 days. Unable to sleep due to frequent interruptions in order to take care of her elderly mother. Unable to use her CPAP. On the initial physical examination temperature 98, blood pressure 122/72, heart rate 75, respiratory rate 20, oxygen saturation 99% on room air. Moist mucous membranes, no pallor or icterus, lungs clear to auscultation bilaterally, heart S1-S2 present rhythmic, abdomen soft nontender, no lower extremity edema, neurologically nonfocal. She does have a chronic left facial droop status post surgery. Sodium 137, potassium 3.7, chloride 102, bicarbonate 24, glucose 129, BUN 20, creatinine 0.66, AST 48, ALT 35, white count 8.9, hemoglobin 12.6, hematocrit 38.5, platelets 230, INR 1.0, TSH 0.76, urine analysis negative for infection, alcohol level less than 5, UDS negative. Brain MRI with no acute intracranial abnormalities, chronic lacunar infarcts in the cerebellum.  Working diagnosis abnormal behavior and amnesia due to significant lack of sleep, rule out acute cerebrovascular accident  1. Amnesia and abnormal behavior due to lack of sleep. Patient was admitted to the hospital for observation, she did not develop any focal neurologic findings, no further amnesic episodes. She was instructed about  sleep hygiene, to aim for 8 hour sleep and use CPAP at night. She was advised to get help in terms of taking care of her elderly mother. Cerebrovascular accident was ruled out. Patient takes alprazolam 0.25 mg as needed for sleep at night.   2. Chronic back pain. Patient will resume tramadol, and baclofen for pain control.  3. Hypothyroidism. Patient clinically euthyroid, will continue levothyroxine, to follow-up as an outpatient.  4. Type 2 diabetes mellitus. Continue metformin. Capillary glucose remained well-controlled.  Discharge Diagnoses:  Active Problems:   Encephalopathy    Discharge Instructions   Allergies as of 07/21/2017      Reactions   Codeine Itching   Tetanus Toxoid Swelling   Please do not give tetanus shot   Adhesive [tape] Rash   Please use paper tape      Medication List    TAKE these medications   ADVAIR DISKUS 250-50 MCG/DOSE Aepb Generic drug:  Fluticasone-Salmeterol INHALE 1 PUFF INTO THE LUNGS EVERY 12 HOURS   ALPRAZolam 0.25 MG tablet Commonly known as:  XANAX TAKE 1 TABLET BY MOUTH EVERY DAY AS NEEDED FOR ANXIETY OR SLEEP   baclofen 20 MG tablet Commonly known as:  LIORESAL TAKE 1 TABLET 4 TIMES DAILY AS NEEDED FOR MUSCLE SPASMS   fluticasone 50 MCG/ACT nasal spray Commonly known as:  FLONASE PLACE 2 SPRAYS INTO BOTH NOSTRILS DAILY AS NEEDED FOR ALLERGIES OR RHINITIS.   furosemide 20 MG tablet Commonly known as:  LASIX Take 1 tablet (20 mg total) by mouth daily as needed for fluid. What changed:  reasons to take this   levothyroxine 100 MCG tablet Commonly known as:  SYNTHROID, LEVOTHROID Take 100 mcg by mouth daily before  breakfast.   metFORMIN 500 MG 24 hr tablet Commonly known as:  GLUCOPHAGE-XR Take 500 mg by mouth 2 (two) times daily.   simvastatin 80 MG tablet Commonly known as:  ZOCOR Take 80 mg by mouth at bedtime.   traMADol 50 MG tablet Commonly known as:  ULTRAM Take 1 tablet (50 mg total) by mouth 3 (three) times  daily as needed.   Vitamin D (Ergocalciferol) 50000 units Caps capsule Commonly known as:  DRISDOL Take 50,000 Units by mouth every 7 (seven) days. Mondays   vitamin E 1000 UNIT capsule Take 3,000 Units by mouth daily.       Allergies  Allergen Reactions  . Codeine Itching  . Tetanus Toxoid Swelling    Please do not give tetanus shot  . Adhesive [Tape] Rash    Please use paper tape    Consultations:     Procedures/Studies: Mr Brain Wo Contrast  Result Date: 07/20/2017 CLINICAL DATA:  70 year old female with confusion, perseveration. EXAM: MRI HEAD WITHOUT CONTRAST TECHNIQUE: Multiplanar, multiecho pulse sequences of the brain and surrounding structures were obtained without intravenous contrast. COMPARISON:  Neck CT 05/12/2015 and earlier. FINDINGS: The examination had to be discontinued prior to completion due to patient motion and agitation. No axial T1, T2*, or coronal T2 weighted imaging could be obtained. Brain: No restricted diffusion or evidence of acute infarction. Several chronic lacunar infarcts in the cerebellum, most notably the left SCA territory on series 7, image 7 where probably there is associated hemosiderin. These appear similar to the 2016 neck CT. Mild for age supratentorial nonspecific white matter T2 and FLAIR hyperintensity. No definite cerebral cortical encephalomalacia. No midline shift, mass effect, evidence of mass lesion, ventriculomegaly, extra-axial collection or acute intracranial hemorrhage. Cervicomedullary junction and pituitary are within normal limits. Vascular: Major intracranial vascular flow voids are preserved. Skull and upper cervical spine: Grossly negative. Normal bone marrow signal. Sinuses/Orbits: Negative. Other: Negative scalp soft tissues. IMPRESSION: 1. No acute intracranial abnormality identified. The examination had to be discontinued prior to completion. 2. 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 Electronically Signed   By: Genevie Ann M.D.   On: 07/20/2017 13:09   Mm Screening Breast Tomo Bilateral  Result Date: 06/23/2017 CLINICAL DATA:  Screening. EXAM: 2D DIGITAL SCREENING BILATERAL MAMMOGRAM WITH CAD AND ADJUNCT TOMO COMPARISON:  Previous exam(s). ACR Breast Density Category b: There are scattered areas of fibroglandular density. FINDINGS: There are no findings suspicious for malignancy. Images were processed with CAD. IMPRESSION: No mammographic evidence of malignancy. A result letter of this screening mammogram will be mailed directly to the patient. RECOMMENDATION: Screening mammogram in one year. (Code:SM-B-01Y) BI-RADS CATEGORY  1: Negative. Electronically Signed   By: Pamelia Hoit M.D.   On: 06/23/2017 15:33       Subjective: Patient feeling better, no nausea or vomiting, no chest pain, no focal neurologic symptoms.   Discharge Exam: Vitals:   07/20/17 1957 07/21/17 0639  BP: 134/69 134/75  Pulse: 69 67  Resp: (!) 24 (!) 22  Temp: 98.8 F (37.1 C) 97.9 F (36.6 C)   Vitals:   07/20/17 2016 07/20/17 2239 07/21/17 0639 07/21/17 0709  BP:   134/75   Pulse:   67   Resp:   (!) 22   Temp:   97.9 F (36.6 C)   TempSrc:   Oral   SpO2: 96% 99% 99% 98%  Weight:      Height:  General: Pt is alert, awake, not in acute distress E ENT: no pallor or icterus, oral mucosa moist.  Cardiovascular: RRR, S1/S2 +, no rubs, no gallops Respiratory: CTA bilaterally, no wheezing, no rhonchi Abdominal: Soft, NT, ND, bowel sounds + Extremities: no edema, no cyanosis    The results of significant diagnostics from this hospitalization (including imaging, microbiology, ancillary and laboratory) are listed below for reference.     Microbiology: No results found for this or any previous visit (from the past 240 hour(s)).   Labs: BNP (last 3 results) No results for input(s): BNP in the last 8760 hours. Basic Metabolic Panel:  Recent Labs Lab 07/20/17 1053  NA 137  K  3.7  CL 102  CO2 24  GLUCOSE 129*  BUN 20  CREATININE 0.66  CALCIUM 9.4   Liver Function Tests:  Recent Labs Lab 07/20/17 1141  AST 48*  ALT 35  ALKPHOS 71  BILITOT 0.7  PROT 7.2  ALBUMIN 3.9   No results for input(s): LIPASE, AMYLASE in the last 168 hours. No results for input(s): AMMONIA in the last 168 hours. CBC:  Recent Labs Lab 07/20/17 1053  WBC 8.9  NEUTROABS 6.1  HGB 12.6  HCT 38.5  MCV 94.6  PLT 230   Cardiac Enzymes: No results for input(s): CKTOTAL, CKMB, CKMBINDEX, TROPONINI in the last 168 hours. BNP: Invalid input(s): POCBNP CBG:  Recent Labs Lab 07/20/17 1617 07/20/17 2033 07/21/17 0109 07/21/17 0742  GLUCAP 106* 117* 121* 123*   D-Dimer No results for input(s): DDIMER in the last 72 hours. Hgb A1c No results for input(s): HGBA1C in the last 72 hours. Lipid Profile No results for input(s): CHOL, HDL, LDLCALC, TRIG, CHOLHDL, LDLDIRECT in the last 72 hours. Thyroid function studies No results for input(s): TSH, T4TOTAL, T3FREE, THYROIDAB in the last 72 hours.  Invalid input(s): FREET3 Anemia work up No results for input(s): VITAMINB12, FOLATE, FERRITIN, TIBC, IRON, RETICCTPCT in the last 72 hours. Urinalysis    Component Value Date/Time   COLORURINE STRAW (A) 07/20/2017 1050   APPEARANCEUR CLEAR 07/20/2017 1050   LABSPEC 1.003 (L) 07/20/2017 1050   PHURINE 6.0 07/20/2017 1050   GLUCOSEU NEGATIVE 07/20/2017 1050   GLUCOSEU NEGATIVE 02/12/2016 1401   HGBUR NEGATIVE 07/20/2017 1050   BILIRUBINUR NEGATIVE 07/20/2017 1050   BILIRUBINUR neg 04/11/2014 1040   KETONESUR NEGATIVE 07/20/2017 1050   PROTEINUR NEGATIVE 07/20/2017 1050   UROBILINOGEN 0.2 02/12/2016 1401   NITRITE NEGATIVE 07/20/2017 1050   LEUKOCYTESUR NEGATIVE 07/20/2017 1050   Sepsis Labs Invalid input(s): PROCALCITONIN,  WBC,  LACTICIDVEN Microbiology No results found for this or any previous visit (from the past 240 hour(s)).   Time coordinating discharge: 45  minutes  SIGNED:   Tawni Millers, MD  [facility] 07/21/2017, 9:24 AM Pager   If 7PM-7AM, please contact night-coverage [email or web address] Password [email or web address]

## 2017-07-21 NOTE — Care Management Obs Status (Signed)
[facility] NOTIFICATION   Patient Details  Name: Amanda Carlson MRN: [id number] Date of Birth: July 21, 1946   Medicare Observation Status Notification Given:  No (discharged within 24 hours)    Deni Berti, Chauncey Reading, RN 07/21/2017, 10:02 AM

## 2017-07-21 NOTE — Telephone Encounter (Signed)
Called and line busy

## 2017-07-21 NOTE — Telephone Encounter (Signed)
°  Relation to pt: self Call back number:[phone]  Reason for call:  Patient was seen in the ED 07/21/17 and states she lives a hour away therefore declined a follow up appointment with PCP. Patient would like Dr. Charlett Blake insight regarding [facility] sending a physical therapist to her home, patient would like to speak with a nurse, please advise

## 2017-07-21 NOTE — Progress Notes (Signed)
Pt's IV catheter removed and intact. Pt's IV site clean dry and intact. Discharge instructions including medications and follow up  Appointments were reviewed and discussed with patient. All questions were answered and no further questions at this time. Pt verbalized understanding of discharge instructions. Pt in stable condition and in no acute distress at this time. Pt will be escorted by nurse tech.

## 2017-07-22 ENCOUNTER — Other Ambulatory Visit: Payer: Self-pay | Admitting: Family Medicine

## 2017-07-22 ENCOUNTER — Telehealth: Payer: Self-pay | Admitting: Family Medicine

## 2017-07-22 DIAGNOSIS — Z9181 History of falling: Secondary | ICD-10-CM

## 2017-07-22 NOTE — Telephone Encounter (Signed)
Patient called to request we just send a prescription for the walker to take to [place] in [place] since close to her home. Wrote prescription as requested Rolling walker with 4 wheels, padded seat and basket. Diagnosis increased risk of falls and debilitated per PCP instructions Will mail to her home Copied to scan.

## 2017-07-22 NOTE — Telephone Encounter (Signed)
Orders put in for both walker and HHRN for PT. Faxed over as well order for rolling walker with seat

## 2017-07-22 NOTE — Telephone Encounter (Signed)
Pt called in to request a copy of the report from hospital visit Forestine Na) mailed to her at the address on file.

## 2017-07-22 NOTE — Telephone Encounter (Signed)
Mailed her discharge summary to her.

## 2017-07-22 NOTE — Telephone Encounter (Signed)
Pt called in because she said that PCP is ordering a chair for her. She would like to make sure that it is a rolling walker with a padded seat and a basket attached.

## 2017-07-25 ENCOUNTER — Telehealth: Payer: Self-pay | Admitting: Family Medicine

## 2017-07-25 NOTE — Telephone Encounter (Signed)
Caller name:Magdelena Schoen Relationship to patient: Can be reached:[phone] Pharmacy:  Reason for call:Requesting to have outpatient PT at [facility]. Would also like a copy of the ER visit.

## 2017-07-25 NOTE — Telephone Encounter (Signed)
I have sent her ER notes to her home on Friday and referral for home health done

## 2017-07-26 ENCOUNTER — Other Ambulatory Visit: Payer: Self-pay | Admitting: Family Medicine

## 2017-07-26 DIAGNOSIS — M545 Low back pain: Secondary | ICD-10-CM

## 2017-07-26 NOTE — Telephone Encounter (Signed)
Referral placed.

## 2017-07-26 NOTE — Telephone Encounter (Signed)
Patient checking on the status of referral mentioned below and checking if ER notes were mailed. Confirmed with patient and patient wanted the # to [facility] confirmed # [phone]

## 2017-07-26 NOTE — Telephone Encounter (Signed)
Pt is requesting outpatient PT at [facility], will need new referral of outpatient. Can referral please be placed?

## 2017-07-27 NOTE — Telephone Encounter (Signed)
Patient would like to speak with Robin regarding message below, please advise

## 2017-07-28 NOTE — Telephone Encounter (Signed)
Pt called in to request a call back from [facility]

## 2017-07-28 NOTE — Telephone Encounter (Signed)
Called the patient to inform her that (see telephone note dated 07/21/17) prescription for her chair and ER discharge summary were mailed to her.

## 2017-08-04 ENCOUNTER — Encounter (HOSPITAL_COMMUNITY): Payer: Self-pay

## 2017-08-04 ENCOUNTER — Ambulatory Visit (HOSPITAL_COMMUNITY): Payer: Medicare Other | Attending: Family Medicine

## 2017-08-04 DIAGNOSIS — R262 Difficulty in walking, not elsewhere classified: Secondary | ICD-10-CM | POA: Diagnosis not present

## 2017-08-04 DIAGNOSIS — G8929 Other chronic pain: Secondary | ICD-10-CM

## 2017-08-04 DIAGNOSIS — R293 Abnormal posture: Secondary | ICD-10-CM

## 2017-08-04 DIAGNOSIS — R29898 Other symptoms and signs involving the musculoskeletal system: Secondary | ICD-10-CM | POA: Diagnosis not present

## 2017-08-04 DIAGNOSIS — M6281 Muscle weakness (generalized): Secondary | ICD-10-CM | POA: Diagnosis not present

## 2017-08-04 DIAGNOSIS — M545 Low back pain, unspecified: Secondary | ICD-10-CM

## 2017-08-04 NOTE — Patient Instructions (Signed)
  Quad stretch off edge of bed  Lie down on your back with your leg off the edge of your bed.  Ignore the rope in the picture!  Perform this 2-3x/day, just do what you can tolerate.

## 2017-08-04 NOTE — Therapy (Signed)
[place], [place], [address] Phone: [phone]   Fax:  [phone]  Physical Therapy Evaluation  Patient Details  Name: Amanda Carlson MRN: [id number] Date of Birth: 02/15/1946 Referring Provider: Mosie Lukes, MD  Encounter Date: 08/04/2017      PT End of Session - 08/04/17 1208    Visit Number 1   Number of Visits 17   Date for PT Re-Evaluation 09/01/17   Authorization Type Medicare Part A and B   Authorization Time Period 08/04/17 to 09/29/17   Authorization - Visit Number 1   Authorization - Number of Visits 10   PT Start Time 1110   PT Stop Time 7253   PT Time Calculation (min) 48 min   Activity Tolerance Patient limited by pain;Patient tolerated treatment well   Behavior During Therapy [facility] for tasks assessed/performed      Past Medical History:  Diagnosis Date  . ALLERGIC RHINITIS, SEASONAL 01/22/2011   Qualifier: Diagnosis of  By: Charlett Blake MD, Erline Levine    . Arthritis   . Breast cancer screening 01/18/2014   Calcified Right side? Follows at [facility]   . Bronchitis    hx of  . Cervical cancer screening 04/11/2014  . CTS (carpal tunnel syndrome) 06/07/2012  . Diabetes mellitus type 2 in obese ([place]) 06/29/2015  . Diabetes mellitus without complication ([place])   . Difficulty swallowing    Liquids and foods at times  . Family history of adverse reaction to anesthesia    Pts mother has a difficult time waking up after anesthesia  . GERD 01/22/2011   Qualifier: Diagnosis of  By: Charlett Blake MD, Erline Levine    . HYPOTHYROIDISM 01/22/2011   Qualifier: Diagnosis of  By: Charlett Blake MD, Boyce Medici with Dr Chalmers Cater   . Insomnia 06/07/2012  . Intrinsic asthma, unspecified 01/22/2011   Qualifier: Diagnosis of  By: Charlett Blake MD, Erline Levine    . Irritable bowel syndrome 01/22/2011   Qualifier: Diagnosis of  By: Charlett Blake MD, Erline Levine    . Knee pain, bilateral 10/16/2011  . Knee pain, right 10/16/2011  . Lesion of breast 01/18/2014   Calcified Right  side? Follows at [facility]  . Low back pain 10/04/2012  . Medicare annual wellness visit, subsequent 04/14/2014  . Numbness and tingling in hands   . Overweight(278.02) 01/22/2011   Qualifier: Diagnosis of  By: Charlett Blake MD, Erline Levine    . Pulmonary nodules 06/14/2017  . Rheumatic fever    as a child  . Shortness of breath dyspnea    with ambulation  . Sleep apnea    wears CPAP machine  . 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  SCC of tongue.    . THYROID CYST 01/22/2011   Qualifier: Diagnosis of  By: Charlett Blake MD, Erline Levine    . Tinea corporis 06/07/2012  . Tinea corporis 02/22/2016  . Tongue cancer ([place])   . Tongue lesion 01/18/2014    Past Surgical History:  Procedure Laterality Date  . cervical fusion, discectomy, metal plate and 2 screws in place    . CHOLECYSTECTOMY    . EXCISION OF TONGUE LESION Left 07/11/2015   Procedure: EXCISION OF TONGUE LESION;  Surgeon: Melissa Montane, MD;  Location: [facility];  Service: ENT;  Laterality: Left;  . KNEE ARTHROSCOPY     right  . RADICAL NECK DISSECTION Left 07/11/2015   Procedure: RADICAL NECK DISSECTION;  Surgeon: Melissa Montane, MD;  Location: [facility]  OR;  Service: ENT;  Laterality: Left;  Left neck dissection and left tongue resection  . THYROIDECTOMY, PARTIAL    . TUBAL LIGATION      There were no vitals filed for this visit.       Subjective Assessment - 08/04/17 1115    Subjective Pt states that she has had LBP for several years. She states that it has just been progressively getting worse and she feels like her hips are going to lock up. It is across her entire lower back and both hips. She denies any radicular symptoms, n/t, but does state that it she is having some trouble holding her urine. She reports her legs are getting weaker since her back pain started hurting more. She denies any falls or close calls in this last 6 months. She has required her RW or [facility] for the last few weeks due to the increased pain.  Getting up and down is the most difficult thing for her to do and walking also aggravates her pain. Rest makes her feel better. She helps take care of her mother where she mainly does her chores, but will help her bathe if she needs it.    Limitations Standing;Walking   How long can you sit comfortably? maybe an hour   How long can you stand comfortably? 15-20 mins, but has to lean on the counter   How long can you walk comfortably? maybe 15 mins    Patient Stated Goals to walk without a cane and walk normally    Currently in Pain? Yes   Pain Score 6    Pain Location Back   Pain Orientation Lower;Right;Left   Pain Descriptors / Indicators Tightness   Pain Type Chronic pain   Pain Onset More than a month ago   Pain Frequency Constant   Aggravating Factors  getting up and down, walking   Pain Relieving Factors rest   Effect of Pain on Daily Activities increases            [facility] PT Assessment - 08/04/17 0001      Assessment   Medical Diagnosis LBP   Referring Provider Mosie Lukes, MD   Onset Date/Surgical Date --  LBP for years, most recent bout going on for a few weeks   Next MD Visit end of October   Prior Therapy yes for current issue about 2 years ago     Precautions   Precautions None     Restrictions   Weight Bearing Restrictions No     Balance Screen   Has the patient fallen in the past 6 months No   Has the patient had a decrease in activity level because of a fear of falling?  Yes   Is the patient reluctant to leave their home because of a fear of falling?  No     Home Ecologist residence     Prior Function   Level of Independence Independent   Vocation Retired   Leisure read     Observation/Other Assessments   Focus on Therapeutic Outcomes (FOTO)  74% limitation     Sensation   Light Touch Appears Intact     Posture/Postural Control   Posture/Postural Control Postural limitations   Postural Limitations Anterior  pelvic tilt;Decreased thoracic kyphosis;Flexed trunk;Decreased lumbar lordosis     ROM / Strength   AROM / PROM / Strength AROM;Strength     AROM   Overall AROM Comments L hip IR and ER  significant limited and painful; R hip IR slightly limited, ER WNL   AROM Assessment Site Lumbar   Lumbar Flexion 50% limited   Lumbar Extension 90% limited, very minimal lumbar extension; REIS x10 reps decreased pain   Lumbar - Right Side Bend WNL   Lumbar - Left Side Bend WNL   Lumbar - Right Rotation WNL   Lumbar - Left Rotation 50% limited     Strength   Overall Strength Comments significant weakness of glutes as demo by difficulty with sit <> stands, but unable to objectively assess as pt unable to lie prone due to pain   Strength Assessment Site Hip;Knee;Ankle   Right Hip Flexion 4+/5   Right Hip ABduction 3+/5   Left Hip Flexion 4/5  LBP   Right Knee Flexion 4+/5   Right Knee Extension 4+/5   Left Knee Flexion 4+/5   Left Knee Extension 4+/5   Right Ankle Dorsiflexion 5/5   Left Ankle Dorsiflexion 5/5     Flexibility   Soft Tissue Assessment /Muscle Length yes   Hamstrings BLE WNL   Quadriceps Tight BLE based on standing posture as she has significant anterior pelvic tilt; and pt unable to fully straighten legs in supine   Piriformis L significantly tight; R min to mod limitations     Palpation   Spinal mobility not assessed   Palpation comment (performed in sidelying) multiple large palpable trigger points in bil lumbar paraspinals and glutes, L>R, tender to palpation throughout; increased restrictions of quads, adductors as well and tender to palpation     Ambulation/Gait   Ambulation Distance (Feet) 30 Feet  around gym   Assistive device Straight cane   Gait Pattern Decreased stride length;Trendelenburg;Antalgic;Trunk flexed  max limitations in hip ext   Gait Comments max limitations in gait noted; using wall for steadiness     Balance   Balance Assessed Yes     Static  Standing Balance   Static Standing - Balance Support No upper extremity supported   Static Standing Balance -  Activities  Single Leg Stance - Right Leg;Single Leg Stance - Left Leg   Static Standing - Comment/# of Minutes R: 5 sec no UE  L: unable due to pain     Standardized Balance Assessment   Standardized Balance Assessment Five Times Sit to Stand;Timed Up and Go Test   Five times sit to stand comments  30 sec from chair, BUE support     Timed Up and Go Test   TUG Normal TUG   Normal TUG (seconds) 35  SPC        Objective measurements completed on examination: See above findings.          PT Education - 08/04/17 1208    Education provided Yes   Education Details exam findings, POC, HEP   Person(s) Educated Patient   Methods Explanation;Demonstration;Handout   Comprehension Verbalized understanding;Returned demonstration          PT Short Term Goals - 08/04/17 1219      PT SHORT TERM GOAL #1   Title Pt will be independent with HEP and consistently perform in order to maximize return to PLOF.   Time 4   Period Weeks   Status New   Target Date 09/01/17     PT SHORT TERM GOAL #2   Title Pt will have improved knee extension AROM by 10 deg or > to demonstrate improved quad/hip flexor flexibility in order to maximize gait and decrease LBP.  Time 4   Period Weeks   Status New     PT SHORT TERM GOAL #3   Title Pt will have improved tolerance to standing and walking to 30 mins or > with 2 or fewer rest breaks required and 3/10 LBP or < to maximize her ability to perform chores for her mother   Time 4   Period Weeks   Status New           PT Long Term Goals - 08/04/17 1222      PT LONG TERM GOAL #1   Title Pt will have improved MMT by 1 grade or > in all tested muscle groups to demonstrate improved strength, decrease pain, and maximize gait.   Time 8   Period Weeks   Status New   Target Date 09/29/17     PT LONG TERM GOAL #2   Title Pt will have  improved 5xSTS to 15 sec or < from chair with no UE support to demonstrate decreased pain and improved functional BLE strength.   Time 8   Period Weeks   Status New     PT LONG TERM GOAL #3   Title Pt will have improved TUG with LRAD to 15 sec or < and with min to no gait deviations to demonstrate improved overall balance and function in order to promote participation at home and in the community.   Time 8   Period Weeks   Status New     PT LONG TERM GOAL #4   Title Pt will be able to perform SLS on BLE for 10 sec or > with no UE support to improve balance and maximize gait.   Time 8   Period Weeks   Status New     PT LONG TERM GOAL #5   Title Pt will have bil knee AROM to [facility] to demonstrate improved overall quad/hip flexor flexibility in order to decrease pain and maximize gait.   Time 8   Period Weeks   Status New     Additional Long Term Goals   Additional Long Term Goals Yes     PT LONG TERM GOAL #6   Title Pt will have improved lumbar extension AROM to 50% limited or better to decrease pain and demonstrate improved flexibility in order to maximize gait and overall function.   Time 8   Period Weeks   Status New                Plan - 08/04/17 1209    Clinical Impression Statement Pt is pleasant 71 YO F who presents to OPPT with c/o chronic LBP, which has progressively worsened over the last few weeks. She denies any radicular symptoms into her BLE, but does report hip pain. Pt has significant limitations in quad/hip flexor flexbility L>R, deficits in lumbar AROM, posture MMT, balance, gait, posture, and functional strength as well as increased soft tissue restricitons throughout lumbar spine, glutes, quads, and adductors. Pt's L knee AROM signifantly impaired due to tight hip flexors/quads, L>R, as she was unable to fully straighten her knees in supine position; L lacking 42deg from neutral and R lacking 10deg. These impairments are affecting pt's ability to perform  functional tasks at home and in the community and she needs skilled PT intervention to address these impairments in order to maximzie overall function and QOL.   History and Personal Factors relevant to plan of care: DM, in remission of tongue cancer, arthritis, chronic LBP, hypothyroidism  Clinical Presentation Stable   Clinical Presentation due to: flexbility of quads, deficits in MMT, ROM, soft tissue restrictions, balance, posture, 5xSTS, TUG with SPC   Clinical Decision Making Low   Rehab Potential Fair   PT Frequency 2x / week   PT Duration 8 weeks   PT Treatment/Interventions ADLs/Self Care Home Management;Cryotherapy;Electrical Stimulation;Moist Heat;DME Instruction;Gait training;Stair training;Functional mobility training;Therapeutic activities;Therapeutic exercise;Balance training;Neuromuscular re-education;Patient/family education;Manual techniques;Passive range of motion;Dry needling;Energy conservation;Taping   PT Next Visit Plan review goals and HEP, quad/rectus femoris/hip flexor stretching, posterior pelvic tilts in supine, bridging, clams, postural strengthening, potentially decompression exercises, manual to lumbar parapsinals and hip musculature   PT Home Exercise Plan eval: supine hip flexor stretch   Consulted and Agree with Plan of Care Patient      Patient will benefit from skilled therapeutic intervention in order to improve the following deficits and impairments:  Abnormal gait, Decreased activity tolerance, Decreased balance, Decreased endurance, Decreased mobility, Decreased range of motion, Decreased strength, Difficulty walking, Hypomobility, Increased muscle spasms, Impaired flexibility, Improper body mechanics, Postural dysfunction, Pain  Visit Diagnosis: Chronic bilateral low back pain without sciatica - Plan: PT plan of care cert/re-cert  Muscle weakness (generalized) - Plan: PT plan of care cert/re-cert  Difficulty in walking, not elsewhere classified - Plan:  PT plan of care cert/re-cert  Abnormal posture - Plan: PT plan of care cert/re-cert  Other symptoms and signs involving the musculoskeletal system - Plan: PT plan of care cert/re-cert      G-Codes - 53/64/68 1229    Functional Assessment Tool Used (Outpatient Only) clinical judgement, 5xSTS, TUG, MMT, AROM, flexbility   Functional Limitation Mobility: Walking and moving around   Mobility: Walking and Moving Around Current Status (E3212) At least 60 percent but less than 80 percent impaired, limited or restricted   Mobility: Walking and Moving Around Goal Status [phone]) At least 20 percent but less than 40 percent impaired, limited or restricted       Problem List Patient Active Problem List   Diagnosis Date Noted  . Encephalopathy 07/20/2017  . Pulmonary nodules 06/14/2017  . Carotid artery disease ([place]) 06/13/2016  . Flank pain 02/22/2016  . Cancer of border of tongue ([place]) 07/11/2015  . Diabetes mellitus type 2 in obese ([place]) 06/29/2015  . Radiculopathy of leg 11/18/2014  . Medicare annual wellness visit, subsequent 04/14/2014  . Urine frequency 04/14/2014  . Cervical cancer screening 04/11/2014  . Obesity 01/20/2014  . 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 Breast cancer screening 01/18/2014  . Low back pain 10/04/2012  . CTS (carpal tunnel syndrome) 06/07/2012  . Tinea corporis 06/07/2012  . Insomnia 06/07/2012  . Knee pain, bilateral 10/16/2011  . Hypothyroidism 01/22/2011  . HTN (hypertension) 01/22/2011  . SLEEP APNEA, OBSTRUCTIVE 01/22/2011  . INTRINSIC ASTHMA, UNSPECIFIED 01/22/2011  . ARTHRITIS, CHRONIC 01/22/2011  . RHEUMATIC FEVER, HX OF 01/22/2011  . CHICKENPOX, HX OF 01/22/2011  . 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 THYROID CYST 01/22/2011  . Hyperlipidemia, mixed 01/22/2011  . ALLERGIC RHINITIS, SEASONAL 01/22/2011  . GERD 01/22/2011  . IRRITABLE BOWEL SYNDROME 01/22/2011  . Disorder of bone and cartilage 01/22/2011  . CARDIAC MURMUR,  HX OF 01/22/2011      Geraldine Solar PT, DPT  [facility] [address] [place], [place], [address] Phone: [phone]   Fax:  [phone]  Name: Amanda Carlson MRN: [id number] Date of Birth: 03/02/1946

## 2017-08-08 ENCOUNTER — Ambulatory Visit (HOSPITAL_COMMUNITY): Payer: Medicare Other

## 2017-08-08 ENCOUNTER — Telehealth (HOSPITAL_COMMUNITY): Payer: Self-pay | Admitting: Family Medicine

## 2017-08-08 NOTE — Telephone Encounter (Signed)
08/08/17 pt rescheduled today's appt because she was having diarrhea

## 2017-08-10 ENCOUNTER — Ambulatory Visit (HOSPITAL_COMMUNITY): Payer: Medicare Other | Admitting: Physical Therapy

## 2017-08-10 DIAGNOSIS — R262 Difficulty in walking, not elsewhere classified: Secondary | ICD-10-CM | POA: Diagnosis not present

## 2017-08-10 DIAGNOSIS — R293 Abnormal posture: Secondary | ICD-10-CM

## 2017-08-10 DIAGNOSIS — M6281 Muscle weakness (generalized): Secondary | ICD-10-CM | POA: Diagnosis not present

## 2017-08-10 DIAGNOSIS — R29898 Other symptoms and signs involving the musculoskeletal system: Secondary | ICD-10-CM | POA: Diagnosis not present

## 2017-08-10 DIAGNOSIS — M545 Low back pain: Secondary | ICD-10-CM | POA: Diagnosis not present

## 2017-08-10 DIAGNOSIS — G8929 Other chronic pain: Secondary | ICD-10-CM | POA: Diagnosis not present

## 2017-08-10 NOTE — Therapy (Signed)
[place], [place], [address] Phone: [phone]   Fax:  [phone]  Physical Therapy Treatment  Patient Details  Name: Amanda Carlson MRN: [id number] Date of Birth: 1946-04-24 Referring Provider: Mosie Lukes, MD  Encounter Date: 08/10/2017      PT End of Session - 08/10/17 1145    Visit Number 2   Number of Visits 17   Date for PT Re-Evaluation 09/01/17   Authorization Type Medicare Part A and B   Authorization Time Period 08/04/17 to 09/29/17   Authorization - Visit Number 2   Authorization - Number of Visits 10   PT Start Time 1036   PT Stop Time 1125   PT Time Calculation (min) 49 min   Activity Tolerance Patient limited by pain;Patient tolerated treatment well   Behavior During Therapy [facility] for tasks assessed/performed      Past Medical History:  Diagnosis Date  . ALLERGIC RHINITIS, SEASONAL 01/22/2011   Qualifier: Diagnosis of  By: Charlett Blake MD, Erline Levine    . Arthritis   . Breast cancer screening 01/18/2014   Calcified Right side? Follows at [facility]   . Bronchitis    hx of  . Cervical cancer screening 04/11/2014  . CTS (carpal tunnel syndrome) 06/07/2012  . Diabetes mellitus type 2 in obese ([place]) 06/29/2015  . Diabetes mellitus without complication ([place])   . Difficulty swallowing    Liquids and foods at times  . Family history of adverse reaction to anesthesia    Pts mother has a difficult time waking up after anesthesia  . GERD 01/22/2011   Qualifier: Diagnosis of  By: Charlett Blake MD, Erline Levine    . HYPOTHYROIDISM 01/22/2011   Qualifier: Diagnosis of  By: Charlett Blake MD, Boyce Medici with Dr Chalmers Cater   . Insomnia 06/07/2012  . Intrinsic asthma, unspecified 01/22/2011   Qualifier: Diagnosis of  By: Charlett Blake MD, Erline Levine    . Irritable bowel syndrome 01/22/2011   Qualifier: Diagnosis of  By: Charlett Blake MD, Erline Levine    . Knee pain, bilateral 10/16/2011  . Knee pain, right 10/16/2011  . Lesion of breast 01/18/2014   Calcified Right  side? Follows at [facility]  . Low back pain 10/04/2012  . Medicare annual wellness visit, subsequent 04/14/2014  . Numbness and tingling in hands   . Overweight(278.02) 01/22/2011   Qualifier: Diagnosis of  By: Charlett Blake MD, Erline Levine    . Pulmonary nodules 06/14/2017  . Rheumatic fever    as a child  . Shortness of breath dyspnea    with ambulation  . Sleep apnea    wears CPAP machine  . 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  SCC of tongue.    . THYROID CYST 01/22/2011   Qualifier: Diagnosis of  By: Charlett Blake MD, Erline Levine    . Tinea corporis 06/07/2012  . Tinea corporis 02/22/2016  . Tongue cancer ([place])   . Tongue lesion 01/18/2014    Past Surgical History:  Procedure Laterality Date  . cervical fusion, discectomy, metal plate and 2 screws in place    . CHOLECYSTECTOMY    . EXCISION OF TONGUE LESION Left 07/11/2015   Procedure: EXCISION OF TONGUE LESION;  Surgeon: Melissa Montane, MD;  Location: [facility];  Service: ENT;  Laterality: Left;  . KNEE ARTHROSCOPY     right  . RADICAL NECK DISSECTION Left 07/11/2015   Procedure: RADICAL NECK DISSECTION;  Surgeon: Melissa Montane, MD;  Location[facility]  OR;  Service: ENT;  Laterality: Left;  Left neck dissection and left tongue resection  . THYROIDECTOMY, PARTIAL    . TUBAL LIGATION      There were no vitals filed for this visit.                       [facility] Adult PT Treatment/Exercise - 08/10/17 0001      Ambulation/Gait   Ambulation Distance (Feet) 100 Feet  50 feet X 2   Assistive device Straight cane   Gait Comments max limitations in gait noted; using wall for steadiness     Lumbar Exercises: Stretches   Lower Trunk Rotation 5 reps;10 seconds   Hip Flexor Stretch 2 reps;20 seconds   Hip Flexor Stretch Limitations supine with LE hanging off, limited stretch due to discomfort     Lumbar Exercises: Standing   Other Standing Lumbar Exercises 3D hip excusions 10 reps each     Lumbar Exercises:  Supine   Clam 10 reps   Bridge 10 reps   Other Supine Lumbar Exercises pelvic tilts 10 reps     Lumbar Exercises: Sidelying   Clam 10 reps   Clam Limitations biltaterally     Manual Therapy   Manual Therapy Soft tissue mobilization;Passive ROM   Manual therapy comments supine and sideying.  completed seperately from all other skilled interventions   Soft tissue mobilization ITB, hip flexors, lumbar   Passive ROM hip flexors and knees                PT Education - 08/10/17 1137    Education provided Yes   Education Details reveiwed HEP and initial evaluation goals.  Given copy of evaluation.  Added supine pelvic tilts, bridge, clams and hip excursions to HEP.  Given written instrucitons   Person(s) Educated Patient   Methods Explanation;Demonstration;Tactile cues;Verbal cues;Handout   Comprehension Verbalized understanding;Returned demonstration;Verbal cues required;Tactile cues required;Need further instruction          PT Short Term Goals - 08/04/17 1219      PT SHORT TERM GOAL #1   Title Pt will be independent with HEP and consistently perform in order to maximize return to PLOF.   Time 4   Period Weeks   Status New   Target Date 09/01/17     PT SHORT TERM GOAL #2   Title Pt will have improved knee extension AROM by 10 deg or > to demonstrate improved quad/hip flexor flexibility in order to maximize gait and decrease LBP.   Time 4   Period Weeks   Status New     PT SHORT TERM GOAL #3   Title Pt will have improved tolerance to standing and walking to 30 mins or > with 2 or fewer rest breaks required and 3/10 LBP or < to maximize her ability to perform chores for her mother   Time 4   Period Weeks   Status New           PT Long Term Goals - 08/04/17 1222      PT LONG TERM GOAL #1   Title Pt will have improved MMT by 1 grade or > in all tested muscle groups to demonstrate improved strength, decrease pain, and maximize gait.   Time 8   Period Weeks    Status New   Target Date 09/29/17     PT LONG TERM GOAL #2   Title Pt will have improved 5xSTS to 15 sec or <  from chair with no UE support to demonstrate decreased pain and improved functional BLE strength.   Time 8   Period Weeks   Status New     PT LONG TERM GOAL #3   Title Pt will have improved TUG with LRAD to 15 sec or < and with min to no gait deviations to demonstrate improved overall balance and function in order to promote participation at home and in the community.   Time 8   Period Weeks   Status New     PT LONG TERM GOAL #4   Title Pt will be able to perform SLS on BLE for 10 sec or > with no UE support to improve balance and maximize gait.   Time 8   Period Weeks   Status New     PT LONG TERM GOAL #5   Title Pt will have bil knee AROM to [facility] to demonstrate improved overall quad/hip flexor flexibility in order to decrease pain and maximize gait.   Time 8   Period Weeks   Status New     Additional Long Term Goals   Additional Long Term Goals Yes     PT LONG TERM GOAL #6   Title Pt will have improved lumbar extension AROM to 50% limited or better to decrease pain and demonstrate improved flexibility in order to maximize gait and overall function.   Time 8   Period Weeks   Status New               Plan - 08/10/17 1153    Clinical Impression Statement Reveiwed goals and HEP per intial evaluation . Pt with difficulty completing hip flexor stretch as inability to maintain positioning due to pain.  Noted difficulty and instability with ambulation, recommending pateint to use walker until she is safer and stronger.  Very rigid with lack of mobility, especially in hips and knees.  Began manual to mobilize tight musculature.  Tightest area today in ITB but difficulty to get full relaxation in hip flexors to fully palpated.     Rehab Potential Fair   PT Frequency 2x / week   PT Duration 8 weeks   PT Treatment/Interventions ADLs/Self Care Home  Management;Cryotherapy;Electrical Stimulation;Moist Heat;DME Instruction;Gait training;Stair training;Functional mobility training;Therapeutic activities;Therapeutic exercise;Balance training;Neuromuscular re-education;Patient/family education;Manual techniques;Passive range of motion;Dry needling;Energy conservation;Taping   PT Next Visit Plan Continue to progress improving hip mobility and LE strength.  Progress postural strengthening, potentially decompression exercises, manual to lumbar parapsinals and hip musculature.  Next session begin seated postural exercises, LAQ, and UE flexion against wall.  Work on gait stability and form.  Progress to standing functional exercises and instruction of stretches in standing.    PT Home Exercise Plan eval: supine hip flexor stretch  8/22:  3D hip excursions, supine bridge, pelvic tilts, clams   Consulted and Agree with Plan of Care Patient      Patient will benefit from skilled therapeutic intervention in order to improve the following deficits and impairments:  Abnormal gait, Decreased activity tolerance, Decreased balance, Decreased endurance, Decreased mobility, Decreased range of motion, Decreased strength, Difficulty walking, Hypomobility, Increased muscle spasms, Impaired flexibility, Improper body mechanics, Postural dysfunction, Pain  Visit Diagnosis: Chronic bilateral low back pain without sciatica  Muscle weakness (generalized)  Difficulty in walking, not elsewhere classified  Abnormal posture  Other symptoms and signs involving the musculoskeletal system     Problem List Patient Active Problem List   Diagnosis Date Noted  . Encephalopathy 07/20/2017  . Pulmonary  nodules 06/14/2017  . Carotid artery disease ([place]) 06/13/2016  . Flank pain 02/22/2016  . Cancer of border of tongue ([place]) 07/11/2015  . Diabetes mellitus type 2 in obese ([place]) 06/29/2015  . Radiculopathy of leg 11/18/2014  . Medicare annual wellness visit, subsequent  04/14/2014  . Urine frequency 04/14/2014  . Cervical cancer screening 04/11/2014  . Obesity 01/20/2014  . 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 Breast cancer screening 01/18/2014  . Low back pain 10/04/2012  . CTS (carpal tunnel syndrome) 06/07/2012  . Tinea corporis 06/07/2012  . Insomnia 06/07/2012  . Knee pain, bilateral 10/16/2011  . Hypothyroidism 01/22/2011  . HTN (hypertension) 01/22/2011  . SLEEP APNEA, OBSTRUCTIVE 01/22/2011  . INTRINSIC ASTHMA, UNSPECIFIED 01/22/2011  . ARTHRITIS, CHRONIC 01/22/2011  . RHEUMATIC FEVER, HX OF 01/22/2011  . CHICKENPOX, HX OF 01/22/2011  . 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 THYROID CYST 01/22/2011  . Hyperlipidemia, mixed 01/22/2011  . ALLERGIC RHINITIS, SEASONAL 01/22/2011  . GERD 01/22/2011  . IRRITABLE BOWEL SYNDROME 01/22/2011  . Disorder of bone and cartilage 01/22/2011  . CARDIAC MURMUR, HX OF 01/22/2011   Teena Irani, PTA/CLT [phone]  Teena Irani 08/10/2017, 12:11 PM  [place], [place], [address] Phone: [phone]   Fax:  [phone]  Name: JAIMYA FELICIANO MRN: [id number] Date of Birth: 02/07/1946

## 2017-08-10 NOTE — Patient Instructions (Addendum)
Abduction / Adduction: Controlled Motion (Supine)    Move leg out to side, controlled and slowly, not allowing hips or body to move.  Repeat with other leg. Do __2_ sessions per day. 10 repetititons   [employer] small of back into mat, maintain pelvic tilt, roll up one vertebrae at a time. Focus on engaging posterior hip muscles. Repeat _10___ times. 2Xday   (Home) Flexion: Pelvic Tilt    Lie with neck supported, knees bent, feet flat. Tighten and suck stomach in, pushing back down against surface. Do not push down with legs.Repeat _10_ times per set. per session. Do .    Selected Exercise __________________________________________        Amanda Carlson  While lying on your side with your knees bent, draw up the top knee while keeping contact of your feet together.  Do not let your pelvis roll back during the lifting movement.  Complete 10 reps 2X daily   HIP EXCURSIONS:  Stand at sink or in front of chair with hands on hips.   Standing extension, hip lateral push outs, hip rotations 10 reps each 2X daily.

## 2017-08-11 ENCOUNTER — Ambulatory Visit (HOSPITAL_COMMUNITY): Payer: Medicare Other | Admitting: Physical Therapy

## 2017-08-11 ENCOUNTER — Other Ambulatory Visit: Payer: Self-pay | Admitting: Family Medicine

## 2017-08-11 DIAGNOSIS — M545 Low back pain, unspecified: Secondary | ICD-10-CM

## 2017-08-11 DIAGNOSIS — G8929 Other chronic pain: Secondary | ICD-10-CM

## 2017-08-11 DIAGNOSIS — R29898 Other symptoms and signs involving the musculoskeletal system: Secondary | ICD-10-CM

## 2017-08-11 DIAGNOSIS — R262 Difficulty in walking, not elsewhere classified: Secondary | ICD-10-CM | POA: Diagnosis not present

## 2017-08-11 DIAGNOSIS — R293 Abnormal posture: Secondary | ICD-10-CM | POA: Diagnosis not present

## 2017-08-11 DIAGNOSIS — M6281 Muscle weakness (generalized): Secondary | ICD-10-CM

## 2017-08-11 NOTE — Telephone Encounter (Signed)
She can have 120 but put on prescription and make sure she understands that this would be more than a 30 day supply, it would be a 40 day supply

## 2017-08-11 NOTE — Therapy (Signed)
[place], [place], [address] Phone: [phone]   Fax:  [phone]  Physical Therapy Treatment  Patient Details  Name: Amanda Carlson MRN: [id number] Date of Birth: 1946/11/15 Referring Provider: Mosie Lukes, MD  Encounter Date: 08/11/2017      PT End of Session - 08/11/17 1346    Visit Number 3   Number of Visits 17   Date for PT Re-Evaluation 09/01/17   Authorization Type Medicare Part A and B   Authorization Time Period 08/04/17 to 09/29/17   Authorization - Visit Number 3   Authorization - Number of Visits 10   PT Start Time 9937   PT Stop Time 1346   PT Time Calculation (min) 43 min   Activity Tolerance Patient limited by pain;Patient tolerated treatment well   Behavior During Therapy [facility] for tasks assessed/performed      Past Medical History:  Diagnosis Date  . ALLERGIC RHINITIS, SEASONAL 01/22/2011   Qualifier: Diagnosis of  By: Charlett Blake MD, Erline Levine    . Arthritis   . Breast cancer screening 01/18/2014   Calcified Right side? Follows at [facility]   . Bronchitis    hx of  . Cervical cancer screening 04/11/2014  . CTS (carpal tunnel syndrome) 06/07/2012  . Diabetes mellitus type 2 in obese ([place]) 06/29/2015  . Diabetes mellitus without complication ([place])   . Difficulty swallowing    Liquids and foods at times  . Family history of adverse reaction to anesthesia    Pts mother has a difficult time waking up after anesthesia  . GERD 01/22/2011   Qualifier: Diagnosis of  By: Charlett Blake MD, Erline Levine    . HYPOTHYROIDISM 01/22/2011   Qualifier: Diagnosis of  By: Charlett Blake MD, Boyce Medici with Dr Chalmers Cater   . Insomnia 06/07/2012  . Intrinsic asthma, unspecified 01/22/2011   Qualifier: Diagnosis of  By: Charlett Blake MD, Erline Levine    . Irritable bowel syndrome 01/22/2011   Qualifier: Diagnosis of  By: Charlett Blake MD, Erline Levine    . Knee pain, bilateral 10/16/2011  . Knee pain, right 10/16/2011  . Lesion of breast 01/18/2014   Calcified Right  side? Follows at [facility]  . Low back pain 10/04/2012  . Medicare annual wellness visit, subsequent 04/14/2014  . Numbness and tingling in hands   . Overweight(278.02) 01/22/2011   Qualifier: Diagnosis of  By: Charlett Blake MD, Erline Levine    . Pulmonary nodules 06/14/2017  . Rheumatic fever    as a child  . Shortness of breath dyspnea    with ambulation  . Sleep apnea    wears CPAP machine  . 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  SCC of tongue.    . THYROID CYST 01/22/2011   Qualifier: Diagnosis of  By: Charlett Blake MD, Erline Levine    . Tinea corporis 06/07/2012  . Tinea corporis 02/22/2016  . Tongue cancer ([place])   . Tongue lesion 01/18/2014    Past Surgical History:  Procedure Laterality Date  . cervical fusion, discectomy, metal plate and 2 screws in place    . CHOLECYSTECTOMY    . EXCISION OF TONGUE LESION Left 07/11/2015   Procedure: EXCISION OF TONGUE LESION;  Surgeon: Melissa Montane, MD;  Location: [facility];  Service: ENT;  Laterality: Left;  . KNEE ARTHROSCOPY     right  . RADICAL NECK DISSECTION Left 07/11/2015   Procedure: RADICAL NECK DISSECTION;  Surgeon: Melissa Montane, MD;  Location[facility]  OR;  Service: ENT;  Laterality: Left;  Left neck dissection and left tongue resection  . THYROIDECTOMY, PARTIAL    . TUBAL LIGATION      There were no vitals filed for this visit.      Subjective Assessment - 08/11/17 1300    Subjective Pt states that she did her exercises and has no questions.    Limitations Standing;Walking   How long can you sit comfortably? maybe an hour   How long can you stand comfortably? 15-20 mins, but has to lean on the counter   How long can you walk comfortably? maybe 15 mins    Patient Stated Goals to walk without a cane and walk normally    Currently in Pain? Yes   Pain Score 6    Pain Location Back   Pain Orientation Lower   Pain Descriptors / Indicators Aching   Pain Type Chronic pain   Pain Onset More than a month ago                          [facility] Adult PT Treatment/Exercise - 08/11/17 0001      Exercises   Exercises Lumbar     Lumbar Exercises: Stretches   Active Hamstring Stretch 3 reps;30 seconds   Lower Trunk Rotation 5 reps     Lumbar Exercises: Standing   Other Standing Lumbar Exercises 3D hip excusions 10 reps each     Lumbar Exercises: Seated   Other Seated Lumbar Exercises sitting as tall as possible; scapular retraction      Lumbar Exercises: Supine   Bent Knee Raise 10 reps   Bridge 10 reps   Other Supine Lumbar Exercises pelvic tilts 10 reps   Other Supine Lumbar Exercises decompression exercises 1-5                 PT Education - 08/10/17 1137    Education provided Yes   Education Details reveiwed HEP and initial evaluation goals.  Given copy of evaluation.  Added supine pelvic tilts, bridge, clams and hip excursions to HEP.  Given written instrucitons   Person(s) Educated Patient   Methods Explanation;Demonstration;Tactile cues;Verbal cues;Handout   Comprehension Verbalized understanding;Returned demonstration;Verbal cues required;Tactile cues required;Need further instruction          PT Short Term Goals - 08/11/17 1349      PT SHORT TERM GOAL #1   Title Pt will be independent with HEP and consistently perform in order to maximize return to PLOF.   Time 4   Period Weeks   Status On-going     PT SHORT TERM GOAL #2   Title Pt will have improved knee extension AROM by 10 deg or > to demonstrate improved quad/hip flexor flexibility in order to maximize gait and decrease LBP.   Time 4   Period Weeks   Status On-going     PT SHORT TERM GOAL #3   Title Pt will have improved tolerance to standing and walking to 30 mins or > with 2 or fewer rest breaks required and 3/10 LBP or < to maximize her ability to perform chores for her mother   Time 4   Period Weeks   Status On-going           PT Long Term Goals - 08/11/17 1349      PT LONG TERM  GOAL #1   Title Pt will have improved MMT by 1 grade or > in all tested  muscle groups to demonstrate improved strength, decrease pain, and maximize gait.   Time 8   Period Weeks   Status On-going     PT LONG TERM GOAL #2   Title Pt will have improved 5xSTS to 15 sec or < from chair with no UE support to demonstrate decreased pain and improved functional BLE strength.   Time 8   Period Weeks   Status On-going     PT LONG TERM GOAL #3   Title Pt will have improved TUG with LRAD to 15 sec or < and with min to no gait deviations to demonstrate improved overall balance and function in order to promote participation at home and in the community.   Time 8   Period Weeks   Status On-going     PT LONG TERM GOAL #4   Title Pt will be able to perform SLS on BLE for 10 sec or > with no UE support to improve balance and maximize gait.   Time 8   Period Weeks   Status On-going     PT LONG TERM GOAL #5   Title Pt will have bil knee AROM to [facility] to demonstrate improved overall quad/hip flexor flexibility in order to decrease pain and maximize gait.   Time 8   Period Weeks   Status On-going     PT LONG TERM GOAL #6   Title Pt will have improved lumbar extension AROM to 50% limited or better to decrease pain and demonstrate improved flexibility in order to maximize gait and overall function.   Time 8   Period Weeks   Status New               Plan - 08/11/17 1347    Clinical Impression Statement Added hamstring stretches and decompression exercises to program with verbal cuing needed for proper technique.  Pt encouraged to ambulate with a posterior tilt in the pelvic area.    Rehab Potential Fair   PT Frequency 2x / week   PT Duration 8 weeks   PT Treatment/Interventions ADLs/Self Care Home Management;Cryotherapy;Electrical Stimulation;Moist Heat;DME Instruction;Gait training;Stair training;Functional mobility training;Therapeutic activities;Therapeutic exercise;Balance  training;Neuromuscular re-education;Patient/family education;Manual techniques;Passive range of motion;Dry needling;Energy conservation;Taping   PT Next Visit Plan Continue to progress improving hip mobility and LE strength.  Progress postural strengthening,, manual to lumbar parapsinals and hip musculature.  Next session begin seated postural exercises, LAQ, and UE flexion against wall with pelvic tilt .  Work on gait stability and form.  Progress to standing functional exercises and instruction of stretches in standing.    PT Home Exercise Plan eval: supine hip flexor stretch  8/22:  3D hip excursions, supine bridge, pelvic tilts, clams   Consulted and Agree with Plan of Care Patient      Patient will benefit from skilled therapeutic intervention in order to improve the following deficits and impairments:  Abnormal gait, Decreased activity tolerance, Decreased balance, Decreased endurance, Decreased mobility, Decreased range of motion, Decreased strength, Difficulty walking, Hypomobility, Increased muscle spasms, Impaired flexibility, Improper body mechanics, Postural dysfunction, Pain  Visit Diagnosis: Chronic bilateral low back pain without sciatica  Muscle weakness (generalized)  Difficulty in walking, not elsewhere classified  Abnormal posture  Other symptoms and signs involving the musculoskeletal system     Problem List Patient Active Problem List   Diagnosis Date Noted  . Encephalopathy 07/20/2017  . Pulmonary nodules 06/14/2017  . Carotid artery disease ([place]) 06/13/2016  . Flank pain 02/22/2016  . Cancer of border of  tongue ([place]) 07/11/2015  . Diabetes mellitus type 2 in obese ([place]) 06/29/2015  . Radiculopathy of leg 11/18/2014  . Medicare annual wellness visit, subsequent 04/14/2014  . Urine frequency 04/14/2014  . Cervical cancer screening 04/11/2014  . Obesity 01/20/2014  . 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 Breast cancer screening 01/18/2014  .  Low back pain 10/04/2012  . CTS (carpal tunnel syndrome) 06/07/2012  . Tinea corporis 06/07/2012  . Insomnia 06/07/2012  . Knee pain, bilateral 10/16/2011  . Hypothyroidism 01/22/2011  . HTN (hypertension) 01/22/2011  . SLEEP APNEA, OBSTRUCTIVE 01/22/2011  . INTRINSIC ASTHMA, UNSPECIFIED 01/22/2011  . ARTHRITIS, CHRONIC 01/22/2011  . RHEUMATIC FEVER, HX OF 01/22/2011  . CHICKENPOX, HX OF 01/22/2011  . 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 THYROID CYST 01/22/2011  . Hyperlipidemia, mixed 01/22/2011  . ALLERGIC RHINITIS, SEASONAL 01/22/2011  . GERD 01/22/2011  . IRRITABLE BOWEL SYNDROME 01/22/2011  . Disorder of bone and cartilage 01/22/2011  . CARDIAC MURMUR, HX OF 01/22/2011  Rayetta Humphrey, PT CLT [phone] 08/11/2017, 1:50 PM  [place] [address] [place], [place], [address] Phone: [phone]   Fax:  [phone]  Name: ANZLEIGH SLAVEN MRN: [id number] Date of Birth: 10-09-1946

## 2017-08-11 NOTE — Telephone Encounter (Signed)
Relation to QQ:PYPP Call back number:[phone] Pharmacy: [id number] - [place], [place] - [address] [phone] (Phone) [phone] (Fax)    Reason for call:   Patient states insurance will not cover 90 tablets of traMADol (ULTRAM) 50 MG tablet the cost is expensive, insurance will cover 120 tablets, patient requesting new Rx reflecting 120 please send to   [id number] - [place], [place] - [address]

## 2017-08-11 NOTE — Patient Instructions (Addendum)
Scapular Retraction (Standing)    With arms at sides, pinch shoulder blades together. Repeat ____ times per set. Do ____ sets per session. Do ____ sessions per day.  http://orth.exer.us/944   Copyright  VHI. All rights reserved.  Stretching: Hamstring (Supine)    Supporting right thigh behind knee, slowly straighten knee until stretch is felt in back of thigh. Hold ___20_ seconds. Repeat __3__ times per set. Do _1___ sets per session. Do __3__ sessions per day.  http://orth.exer.us/656

## 2017-08-11 NOTE — Telephone Encounter (Signed)
Patient states her insurance will cover 120 tabs she would like 120 tabs please advise    Requesting:tramadol Contract:no UDS:no Last OV:06/14/17 Next OV:10/11/17 Last Refill:07/14/17  #90-0 patient requesting 120   Please advise

## 2017-08-13 ENCOUNTER — Other Ambulatory Visit: Payer: Self-pay | Admitting: Family Medicine

## 2017-08-15 ENCOUNTER — Telehealth: Payer: Self-pay | Admitting: *Deleted

## 2017-08-15 ENCOUNTER — Ambulatory Visit (HOSPITAL_COMMUNITY): Payer: Medicare Other | Admitting: Physical Therapy

## 2017-08-15 DIAGNOSIS — M545 Low back pain: Principal | ICD-10-CM

## 2017-08-15 DIAGNOSIS — R29898 Other symptoms and signs involving the musculoskeletal system: Secondary | ICD-10-CM

## 2017-08-15 DIAGNOSIS — R293 Abnormal posture: Secondary | ICD-10-CM

## 2017-08-15 DIAGNOSIS — M6281 Muscle weakness (generalized): Secondary | ICD-10-CM

## 2017-08-15 DIAGNOSIS — R262 Difficulty in walking, not elsewhere classified: Secondary | ICD-10-CM

## 2017-08-15 DIAGNOSIS — G8929 Other chronic pain: Secondary | ICD-10-CM | POA: Diagnosis not present

## 2017-08-15 MED ORDER — TRAMADOL HCL 50 MG PO TABS: 50.0000 mg | ORAL_TABLET | Freq: Three times a day (TID) | ORAL | 0 refills | Status: DC | PRN

## 2017-08-15 NOTE — Telephone Encounter (Signed)
Rx faxed to [facility] pharmacy.  

## 2017-08-15 NOTE — Telephone Encounter (Signed)
Received Medical records from [facility] via mail;  forwarded to provider/SLS 08/27

## 2017-08-15 NOTE — Therapy (Signed)
[place], [place], [address] Phone: [phone]   Fax:  [phone]  Physical Therapy Treatment  Patient Details  Name: Amanda Carlson MRN: [id number] Date of Birth: Oct 18, 1946 Referring Provider: Mosie Lukes, MD  Encounter Date: 08/15/2017      PT End of Session - 08/15/17 1310    Visit Number 4   Number of Visits 17   Date for PT Re-Evaluation 09/01/17   Authorization Type Medicare Part A and B   Authorization Time Period 08/04/17 to 09/29/17   Authorization - Visit Number 4   Authorization - Number of Visits 10   PT Start Time 0347   PT Stop Time 1346   PT Time Calculation (min) 43 min   Activity Tolerance Patient limited by pain;Patient tolerated treatment well   Behavior During Therapy [facility] for tasks assessed/performed      Past Medical History:  Diagnosis Date  . ALLERGIC RHINITIS, SEASONAL 01/22/2011   Qualifier: Diagnosis of  By: Charlett Blake MD, Erline Levine    . Arthritis   . Breast cancer screening 01/18/2014   Calcified Right side? Follows at [place]   . Bronchitis    hx of  . Cervical cancer screening 04/11/2014  . CTS (carpal tunnel syndrome) 06/07/2012  . Diabetes mellitus type 2 in obese ([place]) 06/29/2015  . Diabetes mellitus without complication ([place])   . Difficulty swallowing    Liquids and foods at times  . Family history of adverse reaction to anesthesia    Pts mother has a difficult time waking up after anesthesia  . GERD 01/22/2011   Qualifier: Diagnosis of  By: Charlett Blake MD, Erline Levine    . HYPOTHYROIDISM 01/22/2011   Qualifier: Diagnosis of  By: Charlett Blake MD, Boyce Medici with Dr Chalmers Cater   . Insomnia 06/07/2012  . Intrinsic asthma, unspecified 01/22/2011   Qualifier: Diagnosis of  By: Charlett Blake MD, Erline Levine    . Irritable bowel syndrome 01/22/2011   Qualifier: Diagnosis of  By: Charlett Blake MD, Erline Levine    . Knee pain, bilateral 10/16/2011  . Knee pain, right 10/16/2011  . Lesion of breast 01/18/2014   Calcified Right  side? Follows at [facility]  . Low back pain 10/04/2012  . Medicare annual wellness visit, subsequent 04/14/2014  . Numbness and tingling in hands   . Overweight(278.02) 01/22/2011   Qualifier: Diagnosis of  By: Charlett Blake MD, Erline Levine    . Pulmonary nodules 06/14/2017  . Rheumatic fever    as a child  . Shortness of breath dyspnea    with ambulation  . Sleep apnea    wears CPAP machine  . 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  SCC of tongue.    . THYROID CYST 01/22/2011   Qualifier: Diagnosis of  By: Charlett Blake MD, Erline Levine    . Tinea corporis 06/07/2012  . Tinea corporis 02/22/2016  . Tongue cancer ([place])   . Tongue lesion 01/18/2014    Past Surgical History:  Procedure Laterality Date  . cervical fusion, discectomy, metal plate and 2 screws in place    . CHOLECYSTECTOMY    . EXCISION OF TONGUE LESION Left 07/11/2015   Procedure: EXCISION OF TONGUE LESION;  Surgeon: Melissa Montane, MD;  Location: [facility];  Service: ENT;  Laterality: Left;  . KNEE ARTHROSCOPY     right  . RADICAL NECK DISSECTION Left 07/11/2015   Procedure: RADICAL NECK DISSECTION;  Surgeon: Melissa Montane, MD;  Location: [facility]  OR;  Service: ENT;  Laterality: Left;  Left neck dissection and left tongue resection  . THYROIDECTOMY, PARTIAL    . TUBAL LIGATION      There were no vitals filed for this visit.      Subjective Assessment - 08/15/17 1309    Subjective PT states she is doing well today and feels she is improving.  Currently 5/10 pain.   Currently in Pain? Yes   Pain Score 5    Pain Location Back   Pain Orientation Lower   Pain Type Chronic pain   Pain Radiating Towards Bilateral hips   Pain Frequency Constant                         [facility] Adult PT Treatment/Exercise - 08/15/17 0001      Lumbar Exercises: Stretches   Active Hamstring Stretch 3 reps;30 seconds   Single Knee to Chest Stretch 2 reps;30 seconds   Single Knee to Chest Stretch Limitations with towel     Lower Trunk Rotation 5 reps   Pelvic Tilt 10 seconds     Lumbar Exercises: Standing   Other Standing Lumbar Exercises UE flexion against wall with pelvic tilt 10 reps     Lumbar Exercises: Seated   Other Seated Lumbar Exercises sitting as tall as possible; scapular retraction 10 reps     Lumbar Exercises: Supine   Bent Knee Raise 10 reps   Bridge 10 reps   Other Supine Lumbar Exercises pelvic tilts 10 reps   Other Supine Lumbar Exercises decompression exercises 1-5      Manual Therapy   Manual Therapy Soft tissue mobilization;Passive ROM   Manual therapy comments supine and sideying.  completed seperately from all other skilled interventions   Soft tissue mobilization ITB, hip flexors, lumbar   Passive ROM hip flexors and knees                  PT Short Term Goals - 08/11/17 1349      PT SHORT TERM GOAL #1   Title Pt will be independent with HEP and consistently perform in order to maximize return to PLOF.   Time 4   Period Weeks   Status On-going     PT SHORT TERM GOAL #2   Title Pt will have improved knee extension AROM by 10 deg or > to demonstrate improved quad/hip flexor flexibility in order to maximize gait and decrease LBP.   Time 4   Period Weeks   Status On-going     PT SHORT TERM GOAL #3   Title Pt will have improved tolerance to standing and walking to 30 mins or > with 2 or fewer rest breaks required and 3/10 LBP or < to maximize her ability to perform chores for her mother   Time 4   Period Weeks   Status On-going           PT Long Term Goals - 08/11/17 1349      PT LONG TERM GOAL #1   Title Pt will have improved MMT by 1 grade or > in all tested muscle groups to demonstrate improved strength, decrease pain, and maximize gait.   Time 8   Period Weeks   Status On-going     PT LONG TERM GOAL #2   Title Pt will have improved 5xSTS to 15 sec or < from chair with no UE support to demonstrate decreased pain and improved functional BLE  strength.  Time 8   Period Weeks   Status On-going     PT LONG TERM GOAL #3   Title Pt will have improved TUG with LRAD to 15 sec or < and with min to no gait deviations to demonstrate improved overall balance and function in order to promote participation at home and in the community.   Time 8   Period Weeks   Status On-going     PT LONG TERM GOAL #4   Title Pt will be able to perform SLS on BLE for 10 sec or > with no UE support to improve balance and maximize gait.   Time 8   Period Weeks   Status On-going     PT LONG TERM GOAL #5   Title Pt will have bil knee AROM to [facility] to demonstrate improved overall quad/hip flexor flexibility in order to decrease pain and maximize gait.   Time 8   Period Weeks   Status On-going     PT LONG TERM GOAL #6   Title Pt will have improved lumbar extension AROM to 50% limited or better to decrease pain and demonstrate improved flexibility in order to maximize gait and overall function.   Time 8   Period Weeks   Status New               Plan - 08/15/17 1407    Clinical Impression Statement Noted improvment in posturing and overall gait speed and control.  Able to work on gait with QC as well for short distances without LOB.  Added UE flexion against wall wtih ability to maintain back approx 3 inches from wall during activity.   Increased ease with moving LE's and transitioning movement in supine and sidelying.  Manual completed to tight hip flexors/abd/add muscles as well as DTM using weighted ball to lumbar and glute musculture.  Pt reported much improved mobility and decreased pain at EOS.   Rehab Potential Fair   PT Frequency 2x / week   PT Duration 8 weeks   PT Treatment/Interventions ADLs/Self Care Home Management;Cryotherapy;Electrical Stimulation;Moist Heat;DME Instruction;Gait training;Stair training;Functional mobility training;Therapeutic activities;Therapeutic exercise;Balance training;Neuromuscular re-education;Patient/family  education;Manual techniques;Passive range of motion;Dry needling;Energy conservation;Taping   PT Next Visit Plan Continue to progress improving hip mobility and LE strength.  Progress postural strengthening with addition of X-V.  Work on gait stability and form.  Progress to standing functional exercises and instruction of stretches in standing.    PT Home Exercise Plan eval: supine hip flexor stretch  8/22:  3D hip excursions, supine bridge, pelvic tilts, clams   Consulted and Agree with Plan of Care Patient      Patient will benefit from skilled therapeutic intervention in order to improve the following deficits and impairments:  Abnormal gait, Decreased activity tolerance, Decreased balance, Decreased endurance, Decreased mobility, Decreased range of motion, Decreased strength, Difficulty walking, Hypomobility, Increased muscle spasms, Impaired flexibility, Improper body mechanics, Postural dysfunction, Pain  Visit Diagnosis: Chronic bilateral low back pain without sciatica  Muscle weakness (generalized)  Difficulty in walking, not elsewhere classified  Abnormal posture  Other symptoms and signs involving the musculoskeletal system     Problem List Patient Active Problem List   Diagnosis Date Noted  . Encephalopathy 07/20/2017  . Pulmonary nodules 06/14/2017  . Carotid artery disease ([place]) 06/13/2016  . Flank pain 02/22/2016  . Cancer of border of tongue ([place]) 07/11/2015  . Diabetes mellitus type 2 in obese ([place]) 06/29/2015  . Radiculopathy of leg 11/18/2014  . Medicare annual  wellness visit, subsequent 04/14/2014  . Urine frequency 04/14/2014  . Cervical cancer screening 04/11/2014  . Obesity 01/20/2014  . 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 Breast cancer screening 01/18/2014  . Low back pain 10/04/2012  . CTS (carpal tunnel syndrome) 06/07/2012  . Tinea corporis 06/07/2012  . Insomnia 06/07/2012  . Knee pain, bilateral 10/16/2011  . Hypothyroidism  01/22/2011  . HTN (hypertension) 01/22/2011  . SLEEP APNEA, OBSTRUCTIVE 01/22/2011  . INTRINSIC ASTHMA, UNSPECIFIED 01/22/2011  . ARTHRITIS, CHRONIC 01/22/2011  . RHEUMATIC FEVER, HX OF 01/22/2011  . CHICKENPOX, HX OF 01/22/2011  . 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 THYROID CYST 01/22/2011  . Hyperlipidemia, mixed 01/22/2011  . ALLERGIC RHINITIS, SEASONAL 01/22/2011  . GERD 01/22/2011  . IRRITABLE BOWEL SYNDROME 01/22/2011  . Disorder of bone and cartilage 01/22/2011  . CARDIAC MURMUR, HX OF 01/22/2011   Teena Irani, PTA/CLT [phone]  Teena Irani 08/15/2017, 3:11 PM  [facility] [address] [place], [place], [address] Phone: [phone]   Fax:  [phone]  Name: Amanda Carlson MRN: [id number] Date of Birth: [birth year]

## 2017-08-15 NOTE — Telephone Encounter (Signed)
Rx printed, awaiting MD signature.  

## 2017-08-17 ENCOUNTER — Telehealth: Payer: Self-pay

## 2017-08-17 ENCOUNTER — Ambulatory Visit (HOSPITAL_COMMUNITY): Payer: Medicare Other

## 2017-08-17 DIAGNOSIS — G8929 Other chronic pain: Secondary | ICD-10-CM | POA: Diagnosis not present

## 2017-08-17 DIAGNOSIS — R293 Abnormal posture: Secondary | ICD-10-CM | POA: Diagnosis not present

## 2017-08-17 DIAGNOSIS — M545 Low back pain: Secondary | ICD-10-CM | POA: Diagnosis not present

## 2017-08-17 DIAGNOSIS — R262 Difficulty in walking, not elsewhere classified: Secondary | ICD-10-CM | POA: Diagnosis not present

## 2017-08-17 DIAGNOSIS — R29898 Other symptoms and signs involving the musculoskeletal system: Secondary | ICD-10-CM | POA: Diagnosis not present

## 2017-08-17 DIAGNOSIS — M6281 Muscle weakness (generalized): Secondary | ICD-10-CM

## 2017-08-17 NOTE — Telephone Encounter (Signed)
PA initiated via Covermymeds: KEY: [id number]. Received PA approval through 08/16/2018.

## 2017-08-17 NOTE — Patient Instructions (Signed)
  Quad Stretch  -Begin by placing a wrapping a belt/strap around the ankle of the involved leg.  Lie on your stomach with a pillow or two under your hips/belly -Grab the strap.  DO NOT arch your back or twist to reach the belt/strap. -Induce the stretch on the front of the thigh by pulling on the strap to bring the foot closer to your buttocks -Pull rope over the opposite shoulder  Perform with your other exercises, 3-5 stretches holding for 30-60 seconds

## 2017-08-17 NOTE — Therapy (Signed)
[place], [place], [address] Phone: [phone]   Fax:  [phone]  Physical Therapy Treatment  Patient Details  Name: Amanda Carlson MRN: [id number] Date of Birth: Mar 02, 1946 Referring Provider: Mosie Lukes, MD  Encounter Date: 08/17/2017      PT End of Session - 08/17/17 1303    Visit Number 5   Number of Visits 17   Date for PT Re-Evaluation 09/01/17   Authorization Type Medicare Part A and B   Authorization Time Period 08/04/17 to 09/29/17   Authorization - Visit Number 5   Authorization - Number of Visits 10   PT Start Time 1300   Activity Tolerance Patient limited by pain;Patient tolerated treatment well   Behavior During Therapy [facility] for tasks assessed/performed      Past Medical History:  Diagnosis Date  . ALLERGIC RHINITIS, SEASONAL 01/22/2011   Qualifier: Diagnosis of  By: Charlett Blake MD, Erline Levine    . Arthritis   . Breast cancer screening 01/18/2014   Calcified Right side? Follows at [facility]   . Bronchitis    hx of  . Cervical cancer screening 04/11/2014  . CTS (carpal tunnel syndrome) 06/07/2012  . Diabetes mellitus type 2 in obese ([place]) 06/29/2015  . Diabetes mellitus without complication ([place])   . Difficulty swallowing    Liquids and foods at times  . Family history of adverse reaction to anesthesia    Pts mother has a difficult time waking up after anesthesia  . GERD 01/22/2011   Qualifier: Diagnosis of  By: Charlett Blake MD, Erline Levine    . HYPOTHYROIDISM 01/22/2011   Qualifier: Diagnosis of  By: Charlett Blake MD, Boyce Medici with Dr Chalmers Cater   . Insomnia 06/07/2012  . Intrinsic asthma, unspecified 01/22/2011   Qualifier: Diagnosis of  By: Charlett Blake MD, Erline Levine    . Irritable bowel syndrome 01/22/2011   Qualifier: Diagnosis of  By: Charlett Blake MD, Erline Levine    . Knee pain, bilateral 10/16/2011  . Knee pain, right 10/16/2011  . Lesion of breast 01/18/2014   Calcified Right side? Follows at [place]  . Low back pain 10/04/2012  .  Medicare annual wellness visit, subsequent 04/14/2014  . Numbness and tingling in hands   . Overweight(278.02) 01/22/2011   Qualifier: Diagnosis of  By: Charlett Blake MD, Erline Levine    . Pulmonary nodules 06/14/2017  . Rheumatic fever    as a child  . Shortness of breath dyspnea    with ambulation  . Sleep apnea    wears CPAP machine  . 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  SCC of tongue.    . THYROID CYST 01/22/2011   Qualifier: Diagnosis of  By: Charlett Blake MD, Erline Levine    . Tinea corporis 06/07/2012  . Tinea corporis 02/22/2016  . Tongue cancer ([place])   . Tongue lesion 01/18/2014    Past Surgical History:  Procedure Laterality Date  . cervical fusion, discectomy, metal plate and 2 screws in place    . CHOLECYSTECTOMY    . EXCISION OF TONGUE LESION Left 07/11/2015   Procedure: EXCISION OF TONGUE LESION;  Surgeon: Melissa Montane, MD;  Location: [facility];  Service: ENT;  Laterality: Left;  . KNEE ARTHROSCOPY     right  . RADICAL NECK DISSECTION Left 07/11/2015   Procedure: RADICAL NECK DISSECTION;  Surgeon: Melissa Montane, MD;  Location: [facility];  Service: ENT;  Laterality: Left;  Left neck dissection and left tongue  resection  . THYROIDECTOMY, PARTIAL    . TUBAL LIGATION      There were no vitals filed for this visit.      Subjective Assessment - 08/17/17 1301    Subjective Pt states that she is doing okay today. She is just stiff, not a lot of pain.    Currently in Pain? No/denies               [facility] Adult PT Treatment/Exercise - 08/17/17 0001      Lumbar Exercises: Stretches   Press Ups 5 reps   Press Ups Limitations 2 pillows under hips, 3-5" holds    Sports administrator Limitations thomas test stretch 5x10-15" each; Prone quad stretch with 2 pillows under hips 3x30 sec each     Lumbar Exercises: Standing   Other Standing Lumbar Exercises Y's on wall with liftoff x10 reps   Other Standing Lumbar Exercises UE flexion against wall with pelvic tilt 10  reps     Lumbar Exercises: Seated   Sit to Stand 10 reps  BUE support   Other Seated Lumbar Exercises x to v x15 reps sitting with lumbar roll     Manual Therapy   Manual Therapy Soft tissue mobilization   Manual therapy comments supine; completed separate rest of treatment   Soft tissue mobilization L hip flexors                PT Short Term Goals - 08/11/17 1349      PT SHORT TERM GOAL #1   Title Pt will be independent with HEP and consistently perform in order to maximize return to PLOF.   Time 4   Period Weeks   Status On-going     PT SHORT TERM GOAL #2   Title Pt will have improved knee extension AROM by 10 deg or > to demonstrate improved quad/hip flexor flexibility in order to maximize gait and decrease LBP.   Time 4   Period Weeks   Status On-going     PT SHORT TERM GOAL #3   Title Pt will have improved tolerance to standing and walking to 30 mins or > with 2 or fewer rest breaks required and 3/10 LBP or < to maximize her ability to perform chores for her mother   Time 4   Period Weeks   Status On-going           PT Long Term Goals - 08/11/17 1349      PT LONG TERM GOAL #1   Title Pt will have improved MMT by 1 grade or > in all tested muscle groups to demonstrate improved strength, decrease pain, and maximize gait.   Time 8   Period Weeks   Status On-going     PT LONG TERM GOAL #2   Title Pt will have improved 5xSTS to 15 sec or < from chair with no UE support to demonstrate decreased pain and improved functional BLE strength.   Time 8   Period Weeks   Status On-going     PT LONG TERM GOAL #3   Title Pt will have improved TUG with LRAD to 15 sec or < and with min to no gait deviations to demonstrate improved overall balance and function in order to promote participation at home and in the community.   Time 8   Period Weeks   Status On-going     PT LONG TERM GOAL #4   Title Pt will be able to perform SLS on BLE  for 10 sec or > with no UE  support to improve balance and maximize gait.   Time 8   Period Weeks   Status On-going     PT LONG TERM GOAL #5   Title Pt will have bil knee AROM to [facility] to demonstrate improved overall quad/hip flexor flexibility in order to decrease pain and maximize gait.   Time 8   Period Weeks   Status On-going     PT LONG TERM GOAL #6   Title Pt will have improved lumbar extension AROM to 50% limited or better to decrease pain and demonstrate improved flexibility in order to maximize gait and overall function.   Time 8   Period Weeks   Status New               Plan - 08/17/17 1343    Clinical Impression Statement Pt's posture, function, and overall flexibility continues to improve. Pt able to lie supine with 2 pillows under hips and tolerate prone hip flexor stretch. Pt's R knee was giving her some difficulty today as she reported it was trying to buckle on her so standing functional strengthening not performed this date. Pt educated on sitting with lumbar roll. Her L hip flexor flexibility is improving as her L knee was 32 deg from fully extended, compared to 42 deg at initial eval. Continue POC as planned.   Rehab Potential Fair   PT Frequency 2x / week   PT Duration 8 weeks   PT Treatment/Interventions ADLs/Self Care Home Management;Cryotherapy;Electrical Stimulation;Moist Heat;DME Instruction;Gait training;Stair training;Functional mobility training;Therapeutic activities;Therapeutic exercise;Balance training;Neuromuscular re-education;Patient/family education;Manual techniques;Passive range of motion;Dry needling;Energy conservation;Taping   PT Next Visit Plan Continue to progress improving hip mobility and LE strength.  Progress postural strengthening.  Work on gait stability and form.  Progress to standing functional exercises as tolerated.   PT Home Exercise Plan eval: supine hip flexor stretch  8/22:  3D hip excursions, supine bridge, pelvic tilts, clams; 8/29: prone quad stretch with  rope and pillows under hips   Consulted and Agree with Plan of Care Patient      Patient will benefit from skilled therapeutic intervention in order to improve the following deficits and impairments:  Abnormal gait, Decreased activity tolerance, Decreased balance, Decreased endurance, Decreased mobility, Decreased range of motion, Decreased strength, Difficulty walking, Hypomobility, Increased muscle spasms, Impaired flexibility, Improper body mechanics, Postural dysfunction, Pain  Visit Diagnosis: Chronic bilateral low back pain without sciatica  Muscle weakness (generalized)  Difficulty in walking, not elsewhere classified  Abnormal posture  Other symptoms and signs involving the musculoskeletal system     Problem List Patient Active Problem List   Diagnosis Date Noted  . Encephalopathy 07/20/2017  . Pulmonary nodules 06/14/2017  . Carotid artery disease ([place]) 06/13/2016  . Flank pain 02/22/2016  . Cancer of border of tongue ([place]) 07/11/2015  . Diabetes mellitus type 2 in obese ([place]) 06/29/2015  . Radiculopathy of leg 11/18/2014  . Medicare annual wellness visit, subsequent 04/14/2014  . Urine frequency 04/14/2014  . Cervical cancer screening 04/11/2014  . Obesity 01/20/2014  . 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 Breast cancer screening 01/18/2014  . Low back pain 10/04/2012  . CTS (carpal tunnel syndrome) 06/07/2012  . Tinea corporis 06/07/2012  . Insomnia 06/07/2012  . Knee pain, bilateral 10/16/2011  . Hypothyroidism 01/22/2011  . HTN (hypertension) 01/22/2011  . SLEEP APNEA, OBSTRUCTIVE 01/22/2011  . INTRINSIC ASTHMA, UNSPECIFIED 01/22/2011  . ARTHRITIS, CHRONIC 01/22/2011  . RHEUMATIC  FEVER, HX OF 01/22/2011  . CHICKENPOX, HX OF 01/22/2011  . 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 THYROID CYST 01/22/2011  . Hyperlipidemia, mixed 01/22/2011  . ALLERGIC RHINITIS, SEASONAL 01/22/2011  . GERD 01/22/2011  . IRRITABLE BOWEL SYNDROME 01/22/2011  .  Disorder of bone and cartilage 01/22/2011  . CARDIAC MURMUR, HX OF 01/22/2011      Geraldine Solar PT, DPT  [facility] [address] [place], [place], [address] Phone: [phone]   Fax:  [phone]  Name: Amanda Carlson MRN: [id number] Date of Birth: 1946/05/25

## 2017-08-23 ENCOUNTER — Encounter (HOSPITAL_COMMUNITY): Payer: Self-pay

## 2017-08-23 ENCOUNTER — Ambulatory Visit (HOSPITAL_COMMUNITY): Payer: Medicare Other | Attending: Family Medicine

## 2017-08-23 DIAGNOSIS — R262 Difficulty in walking, not elsewhere classified: Secondary | ICD-10-CM | POA: Diagnosis not present

## 2017-08-23 DIAGNOSIS — M6281 Muscle weakness (generalized): Secondary | ICD-10-CM | POA: Diagnosis not present

## 2017-08-23 DIAGNOSIS — R29898 Other symptoms and signs involving the musculoskeletal system: Secondary | ICD-10-CM | POA: Diagnosis not present

## 2017-08-23 DIAGNOSIS — G8929 Other chronic pain: Secondary | ICD-10-CM | POA: Diagnosis not present

## 2017-08-23 DIAGNOSIS — M545 Low back pain: Secondary | ICD-10-CM | POA: Insufficient documentation

## 2017-08-23 DIAGNOSIS — R293 Abnormal posture: Secondary | ICD-10-CM | POA: Insufficient documentation

## 2017-08-23 NOTE — Therapy (Signed)
[place], [place], [address] Phone: [phone]   Fax:  [phone]  Physical Therapy Treatment  Patient Details  Name: Amanda Carlson MRN: [id number] Date of Birth: 09-07-1946 Referring Provider: Mosie Lukes, MD  Encounter Date: 08/23/2017      PT End of Session - 08/23/17 1259    Visit Number 6   Number of Visits 17   Date for PT Re-Evaluation 09/01/17   Authorization Type Medicare Part A and B   Authorization Time Period 08/04/17 to 09/29/17   Authorization - Visit Number 6   Authorization - Number of Visits 10   PT Start Time 1302   PT Stop Time 6962   PT Time Calculation (min) 35 min   Activity Tolerance Patient limited by pain;Patient tolerated treatment well   Behavior During Therapy [facility] for tasks assessed/performed      Past Medical History:  Diagnosis Date  . ALLERGIC RHINITIS, SEASONAL 01/22/2011   Qualifier: Diagnosis of  By: Charlett Blake MD, Erline Levine    . Arthritis   . Breast cancer screening 01/18/2014   Calcified Right side? Follows at [facility]   . Bronchitis    hx of  . Cervical cancer screening 04/11/2014  . CTS (carpal tunnel syndrome) 06/07/2012  . Diabetes mellitus type 2 in obese ([place]) 06/29/2015  . Diabetes mellitus without complication ([place])   . Difficulty swallowing    Liquids and foods at times  . Family history of adverse reaction to anesthesia    Pts mother has a difficult time waking up after anesthesia  . GERD 01/22/2011   Qualifier: Diagnosis of  By: Charlett Blake MD, Erline Levine    . HYPOTHYROIDISM 01/22/2011   Qualifier: Diagnosis of  By: Charlett Blake MD, Boyce Medici with Dr Chalmers Cater   . Insomnia 06/07/2012  . Intrinsic asthma, unspecified 01/22/2011   Qualifier: Diagnosis of  By: Charlett Blake MD, Erline Levine    . Irritable bowel syndrome 01/22/2011   Qualifier: Diagnosis of  By: Charlett Blake MD, Erline Levine    . Knee pain, bilateral 10/16/2011  . Knee pain, right 10/16/2011  . Lesion of breast 01/18/2014   Calcified Right  side? Follows at [facility]  . Low back pain 10/04/2012  . Medicare annual wellness visit, subsequent 04/14/2014  . Numbness and tingling in hands   . Overweight(278.02) 01/22/2011   Qualifier: Diagnosis of  By: Charlett Blake MD, Erline Levine    . Pulmonary nodules 06/14/2017  . Rheumatic fever    as a child  . Shortness of breath dyspnea    with ambulation  . Sleep apnea    wears CPAP machine  . 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  SCC of tongue.    . THYROID CYST 01/22/2011   Qualifier: Diagnosis of  By: Charlett Blake MD, Erline Levine    . Tinea corporis 06/07/2012  . Tinea corporis 02/22/2016  . Tongue cancer ([place])   . Tongue lesion 01/18/2014    Past Surgical History:  Procedure Laterality Date  . cervical fusion, discectomy, metal plate and 2 screws in place    . CHOLECYSTECTOMY    . EXCISION OF TONGUE LESION Left 07/11/2015   Procedure: EXCISION OF TONGUE LESION;  Surgeon: Melissa Montane, MD;  Location: [facility];  Service: ENT;  Laterality: Left;  . KNEE ARTHROSCOPY     right  . RADICAL NECK DISSECTION Left 07/11/2015   Procedure: RADICAL NECK DISSECTION;  Surgeon: Melissa Montane, MD;  Location: [facility]  OR;  Service: ENT;  Laterality: Left;  Left neck dissection and left tongue resection  . THYROIDECTOMY, PARTIAL    . TUBAL LIGATION      There were no vitals filed for this visit.      Subjective Assessment - 08/23/17 1304    Subjective Pt states that when she woke up this morning she felt stiff and she had a rough night with her mom last night. She laid back down to rest and when she went to get back up it felt like her L hip wanted to spasm on her. She tried to stretch it out but she said it did not work.    Currently in Pain? Yes   Pain Score 7    Pain Location Hip   Pain Orientation Left   Pain Descriptors / Indicators Spasm  catching   Pain Type Chronic pain   Pain Onset More than a month ago   Pain Frequency Constant   Aggravating Factors  getting up and down,  walking   Pain Relieving Factors rest   Effect of Pain on Daily Activities increases                [facility] Adult PT Treatment/Exercise - 08/23/17 0001      Lumbar Exercises: Stretches   Active Hamstring Stretch 3 reps;30 seconds   Active Hamstring Stretch Limitations BLE, supine with rope   Quad Stretch Limitations Prone quad stretch with 2 pillows under hips 5x30 sec each     Manual Therapy   Manual Therapy Soft tissue mobilization   Manual therapy comments prone (2 pillows under hips); completed separate rest of treatment   Soft tissue mobilization bil glutes, piriformis, lumbar paraspinals with green ball              PT Short Term Goals - 08/11/17 1349      PT SHORT TERM GOAL #1   Title Pt will be independent with HEP and consistently perform in order to maximize return to PLOF.   Time 4   Period Weeks   Status On-going     PT SHORT TERM GOAL #2   Title Pt will have improved knee extension AROM by 10 deg or > to demonstrate improved quad/hip flexor flexibility in order to maximize gait and decrease LBP.   Time 4   Period Weeks   Status On-going     PT SHORT TERM GOAL #3   Title Pt will have improved tolerance to standing and walking to 30 mins or > with 2 or fewer rest breaks required and 3/10 LBP or < to maximize her ability to perform chores for her mother   Time 4   Period Weeks   Status On-going           PT Long Term Goals - 08/11/17 1349      PT LONG TERM GOAL #1   Title Pt will have improved MMT by 1 grade or > in all tested muscle groups to demonstrate improved strength, decrease pain, and maximize gait.   Time 8   Period Weeks   Status On-going     PT LONG TERM GOAL #2   Title Pt will have improved 5xSTS to 15 sec or < from chair with no UE support to demonstrate decreased pain and improved functional BLE strength.   Time 8   Period Weeks   Status On-going     PT LONG TERM GOAL #3   Title Pt will have improved TUG with  LRAD to 15  sec or < and with min to no gait deviations to demonstrate improved overall balance and function in order to promote participation at home and in the community.   Time 8   Period Weeks   Status On-going     PT LONG TERM GOAL #4   Title Pt will be able to perform SLS on BLE for 10 sec or > with no UE support to improve balance and maximize gait.   Time 8   Period Weeks   Status On-going     PT LONG TERM GOAL #5   Title Pt will have bil knee AROM to [facility] to demonstrate improved overall quad/hip flexor flexibility in order to decrease pain and maximize gait.   Time 8   Period Weeks   Status On-going     PT LONG TERM GOAL #6   Title Pt will have improved lumbar extension AROM to 50% limited or better to decrease pain and demonstrate improved flexibility in order to maximize gait and overall function.   Time 8   Period Weeks   Status New               Plan - 08/23/17 1341    Clinical Impression Statement Pt presented to therapy with increased hip/trunk flexion during ambulation and reported increased pain at 7/10 this date. Most of session focused on improving soft tissue restrictions to decrease pain but hip flexion and HS stretching also performed to help decrease pain and improve flexibility. Pt reported decreased pain to 5/10, stating she felt significantly better at EOS. She was educated to only perform stretching later today and to resume her normal HEP routine tomorrow and she verbalized understanding. Continue POC as planned and progress pt if able next session.   Rehab Potential Fair   PT Frequency 2x / week   PT Duration 8 weeks   PT Treatment/Interventions ADLs/Self Care Home Management;Cryotherapy;Electrical Stimulation;Moist Heat;DME Instruction;Gait training;Stair training;Functional mobility training;Therapeutic activities;Therapeutic exercise;Balance training;Neuromuscular re-education;Patient/family education;Manual techniques;Passive range of motion;Dry  needling;Energy conservation;Taping   PT Next Visit Plan Continue to progress improving hip mobility and LE strength.  Progress postural strengthening.  Work on gait stability and form.  Progress to standing functional exercises as tolerated. manual to paraspinals, glutes PRN   PT Home Exercise Plan eval: supine hip flexor stretch  8/22:  3D hip excursions, supine bridge, pelvic tilts, clams; 8/29: prone quad stretch with rope and pillows under hips   Consulted and Agree with Plan of Care Patient      Patient will benefit from skilled therapeutic intervention in order to improve the following deficits and impairments:  Abnormal gait, Decreased activity tolerance, Decreased balance, Decreased endurance, Decreased mobility, Decreased range of motion, Decreased strength, Difficulty walking, Hypomobility, Increased muscle spasms, Impaired flexibility, Improper body mechanics, Postural dysfunction, Pain  Visit Diagnosis: Chronic bilateral low back pain without sciatica  Muscle weakness (generalized)  Difficulty in walking, not elsewhere classified  Abnormal posture  Other symptoms and signs involving the musculoskeletal system     Problem List Patient Active Problem List   Diagnosis Date Noted  . Encephalopathy 07/20/2017  . Pulmonary nodules 06/14/2017  . Carotid artery disease ([place]) 06/13/2016  . Flank pain 02/22/2016  . Cancer of border of tongue ([place]) 07/11/2015  . Diabetes mellitus type 2 in obese ([place]) 06/29/2015  . Radiculopathy of leg 11/18/2014  . Medicare annual wellness visit, subsequent 04/14/2014  . Urine frequency 04/14/2014  . Cervical cancer screening 04/11/2014  . Obesity 01/20/2014  .  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 Breast cancer screening 01/18/2014  . Low back pain 10/04/2012  . CTS (carpal tunnel syndrome) 06/07/2012  . Tinea corporis 06/07/2012  . Insomnia 06/07/2012  . Knee pain, bilateral 10/16/2011  . Hypothyroidism 01/22/2011  .  HTN (hypertension) 01/22/2011  . SLEEP APNEA, OBSTRUCTIVE 01/22/2011  . INTRINSIC ASTHMA, UNSPECIFIED 01/22/2011  . ARTHRITIS, CHRONIC 01/22/2011  . RHEUMATIC FEVER, HX OF 01/22/2011  . CHICKENPOX, HX OF 01/22/2011  . 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 THYROID CYST 01/22/2011  . Hyperlipidemia, mixed 01/22/2011  . ALLERGIC RHINITIS, SEASONAL 01/22/2011  . GERD 01/22/2011  . IRRITABLE BOWEL SYNDROME 01/22/2011  . Disorder of bone and cartilage 01/22/2011  . CARDIAC MURMUR, HX OF 01/22/2011      Geraldine Solar PT, DPT  [facility] [address] [place], [place], [address] Phone: [phone]   Fax:  [phone]  Name: Amanda Carlson MRN: [id number] Date of Birth: November 13, 1946

## 2017-08-25 ENCOUNTER — Ambulatory Visit (HOSPITAL_COMMUNITY): Payer: Medicare Other

## 2017-08-25 DIAGNOSIS — M545 Low back pain, unspecified: Secondary | ICD-10-CM

## 2017-08-25 DIAGNOSIS — R29898 Other symptoms and signs involving the musculoskeletal system: Secondary | ICD-10-CM

## 2017-08-25 DIAGNOSIS — R262 Difficulty in walking, not elsewhere classified: Secondary | ICD-10-CM

## 2017-08-25 DIAGNOSIS — G8929 Other chronic pain: Secondary | ICD-10-CM | POA: Diagnosis not present

## 2017-08-25 DIAGNOSIS — M6281 Muscle weakness (generalized): Secondary | ICD-10-CM | POA: Diagnosis not present

## 2017-08-25 DIAGNOSIS — R293 Abnormal posture: Secondary | ICD-10-CM

## 2017-08-25 NOTE — Therapy (Signed)
[place], [place], [address] Phone: [phone]   Fax:  [phone]  Physical Therapy Treatment  Patient Details  Name: Amanda Carlson MRN: [id number] Date of Birth: 1946-04-14 Referring Provider: Mosie Lukes, MD  Encounter Date: 08/25/2017      PT End of Session - 08/25/17 1426    Visit Number 7   Number of Visits 17   Date for PT Re-Evaluation 09/01/17   Authorization Type Medicare Part A and B   Authorization Time Period 08/04/17 to 09/29/17   Authorization - Visit Number 7   Authorization - Number of Visits 10   PT Start Time 1350   PT Stop Time 1428   PT Time Calculation (min) 38 min   Activity Tolerance Patient tolerated treatment well;Patient limited by fatigue   Behavior During Therapy [facility] for tasks assessed/performed      Past Medical History:  Diagnosis Date  . ALLERGIC RHINITIS, SEASONAL 01/22/2011   Qualifier: Diagnosis of  By: Charlett Blake MD, Erline Levine    . Arthritis   . Breast cancer screening 01/18/2014   Calcified Right side? Follows at [facility]   . Bronchitis    hx of  . Cervical cancer screening 04/11/2014  . CTS (carpal tunnel syndrome) 06/07/2012  . Diabetes mellitus type 2 in obese ([place]) 06/29/2015  . Diabetes mellitus without complication ([place])   . Difficulty swallowing    Liquids and foods at times  . Family history of adverse reaction to anesthesia    Pts mother has a difficult time waking up after anesthesia  . GERD 01/22/2011   Qualifier: Diagnosis of  By: Charlett Blake MD, Erline Levine    . HYPOTHYROIDISM 01/22/2011   Qualifier: Diagnosis of  By: Charlett Blake MD, Boyce Medici with Dr Chalmers Cater   . Insomnia 06/07/2012  . Intrinsic asthma, unspecified 01/22/2011   Qualifier: Diagnosis of  By: Charlett Blake MD, Erline Levine    . Irritable bowel syndrome 01/22/2011   Qualifier: Diagnosis of  By: Charlett Blake MD, Erline Levine    . Knee pain, bilateral 10/16/2011  . Knee pain, right 10/16/2011  . Lesion of breast 01/18/2014   Calcified Right  side? Follows at [facility] [place] [facility]  . Low back pain 10/04/2012  . Medicare annual wellness visit, subsequent 04/14/2014  . Numbness and tingling in hands   . Overweight(278.02) 01/22/2011   Qualifier: Diagnosis of  By: Charlett Blake MD, Erline Levine    . Pulmonary nodules 06/14/2017  . Rheumatic fever    as a child  . Shortness of breath dyspnea    with ambulation  . Sleep apnea    wears CPAP machine  . 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  SCC of tongue.    . THYROID CYST 01/22/2011   Qualifier: Diagnosis of  By: Charlett Blake MD, Erline Levine    . Tinea corporis 06/07/2012  . Tinea corporis 02/22/2016  . Tongue cancer ([place])   . Tongue lesion 01/18/2014    Past Surgical History:  Procedure Laterality Date  . cervical fusion, discectomy, metal plate and 2 screws in place    . CHOLECYSTECTOMY    . EXCISION OF TONGUE LESION Left 07/11/2015   Procedure: EXCISION OF TONGUE LESION;  Surgeon: Melissa Montane, MD;  Location: [facility];  Service: ENT;  Laterality: Left;  . KNEE ARTHROSCOPY     right  . RADICAL NECK DISSECTION Left 07/11/2015   Procedure: RADICAL NECK DISSECTION;  Surgeon: Melissa Montane, MD;  Location: [facility]  OR;  Service: ENT;  Laterality: Left;  Left neck dissection and left tongue resection  . THYROIDECTOMY, PARTIAL    . TUBAL LIGATION      There were no vitals filed for this visit.      Subjective Assessment - 08/25/17 1353    Subjective Pt reports the massage and stretches assisted wiht pain control in Lt hip following last session.  Continues to c/o tightness Lt hip area with current pain scale at 4/10.     Patient Stated Goals to walk without a cane and walk normally    Currently in Pain? Yes   Pain Score 4    Pain Location Hip   Pain Orientation Left   Pain Descriptors / Indicators Aching;Tightness;Sore   Pain Type Chronic pain   Pain Onset More than a month ago   Pain Frequency Constant   Aggravating Factors  getting up and down, walking    Pain Relieving  Factors rest, stretches   Effect of Pain on Daily Activities increases                         [facility] Adult PT Treatment/Exercise - 08/25/17 0001      Lumbar Exercises: Stretches   Active Hamstring Stretch 3 reps;30 seconds   Active Hamstring Stretch Limitations BLE, supine with rope   Lower Trunk Rotation 5 reps;10 seconds   Hip Flexor Stretch 3 reps;20 seconds   Hip Flexor Stretch Limitations prone with pillow under hip manual stretch   Press Ups 5 reps   Press Ups Limitations 2 pillows under hips, 3-5" holds    Quad Stretch 3 reps;30 seconds   Quad Stretch Limitations Prone quad stretch with 2 pillows under hips 5x30 sec each     Lumbar Exercises: Standing   Other Standing Lumbar Exercises Y's on wall with liftoff x10 reps   Other Standing Lumbar Exercises pelvic tilt to improve posture x 2 min     Lumbar Exercises: Supine   Bridge 10 reps     Manual Therapy   Manual Therapy Soft tissue mobilization   Manual therapy comments prone (2 pillows under hips); completed separate rest of treatment   Soft tissue mobilization bil glutes, piriformis, lumbar paraspinals with green ball   Passive ROM hip flexors, IR/ER and knees                  PT Short Term Goals - 08/11/17 1349      PT SHORT TERM GOAL #1   Title Pt will be independent with HEP and consistently perform in order to maximize return to PLOF.   Time 4   Period Weeks   Status On-going     PT SHORT TERM GOAL #2   Title Pt will have improved knee extension AROM by 10 deg or > to demonstrate improved quad/hip flexor flexibility in order to maximize gait and decrease LBP.   Time 4   Period Weeks   Status On-going     PT SHORT TERM GOAL #3   Title Pt will have improved tolerance to standing and walking to 30 mins or > with 2 or fewer rest breaks required and 3/10 LBP or < to maximize her ability to perform chores for her mother   Time 4   Period Weeks   Status On-going           PT  Long Term Goals - 08/11/17 1349      PT LONG TERM GOAL #  1   Title Pt will have improved MMT by 1 grade or > in all tested muscle groups to demonstrate improved strength, decrease pain, and maximize gait.   Time 8   Period Weeks   Status On-going     PT LONG TERM GOAL #2   Title Pt will have improved 5xSTS to 15 sec or < from chair with no UE support to demonstrate decreased pain and improved functional BLE strength.   Time 8   Period Weeks   Status On-going     PT LONG TERM GOAL #3   Title Pt will have improved TUG with LRAD to 15 sec or < and with min to no gait deviations to demonstrate improved overall balance and function in order to promote participation at home and in the community.   Time 8   Period Weeks   Status On-going     PT LONG TERM GOAL #4   Title Pt will be able to perform SLS on BLE for 10 sec or > with no UE support to improve balance and maximize gait.   Time 8   Period Weeks   Status On-going     PT LONG TERM GOAL #5   Title Pt will have bil knee AROM to [facility] to demonstrate improved overall quad/hip flexor flexibility in order to decrease pain and maximize gait.   Time 8   Period Weeks   Status On-going     PT LONG TERM GOAL #6   Title Pt will have improved lumbar extension AROM to 50% limited or better to decrease pain and demonstrate improved flexibility in order to maximize gait and overall function.   Time 8   Period Weeks   Status New               Plan - 08/25/17 1432    Clinical Impression Statement Began session addressing hip mobility wiht manual soft tissue mobilization tecniques and active stretches.  Therex focus on LE stretches to improve mobility and gluteal strengthening to assist with functional activities.  Pt educated on importance of proper posture for pain control.  Reviewed compliance with HEP and encouraged to increase reps for activity tolerance and overall strengthening.  EOS pt reports pain reduced and improve mobility with  gait.     Rehab Potential Fair   PT Frequency 2x / week   PT Duration 8 weeks   PT Treatment/Interventions ADLs/Self Care Home Management;Cryotherapy;Electrical Stimulation;Moist Heat;DME Instruction;Gait training;Stair training;Functional mobility training;Therapeutic activities;Therapeutic exercise;Balance training;Neuromuscular re-education;Patient/family education;Manual techniques;Passive range of motion;Dry needling;Energy conservation;Taping   PT Next Visit Plan Continue to progress improving hip mobility and LE strength.  Progress postural strengthening.  Work on gait stability and form.  Progress to standing functional exercises as tolerated. manual to paraspinals, glutes PRN   PT Home Exercise Plan eval: supine hip flexor stretch  8/22:  3D hip excursions, supine bridge, pelvic tilts, clams; 8/29: prone quad stretch with rope and pillows under hips      Patient will benefit from skilled therapeutic intervention in order to improve the following deficits and impairments:  Abnormal gait, Decreased activity tolerance, Decreased balance, Decreased endurance, Decreased mobility, Decreased range of motion, Decreased strength, Difficulty walking, Hypomobility, Increased muscle spasms, Impaired flexibility, Improper body mechanics, Postural dysfunction, Pain  Visit Diagnosis: Chronic bilateral low back pain without sciatica  Muscle weakness (generalized)  Difficulty in walking, not elsewhere classified  Abnormal posture  Other symptoms and signs involving the musculoskeletal system     Problem List Patient  Active Problem List   Diagnosis Date Noted  . Encephalopathy 07/20/2017  . Pulmonary nodules 06/14/2017  . Carotid artery disease ([place]) 06/13/2016  . Flank pain 02/22/2016  . Cancer of border of tongue ([place]) 07/11/2015  . Diabetes mellitus type 2 in obese ([place]) 06/29/2015  . Radiculopathy of leg 11/18/2014  . Medicare annual wellness visit, subsequent 04/14/2014  . Urine  frequency 04/14/2014  . Cervical cancer screening 04/11/2014  . Obesity 01/20/2014  . 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 Breast cancer screening 01/18/2014  . Low back pain 10/04/2012  . CTS (carpal tunnel syndrome) 06/07/2012  . Tinea corporis 06/07/2012  . Insomnia 06/07/2012  . Knee pain, bilateral 10/16/2011  . Hypothyroidism 01/22/2011  . HTN (hypertension) 01/22/2011  . SLEEP APNEA, OBSTRUCTIVE 01/22/2011  . INTRINSIC ASTHMA, UNSPECIFIED 01/22/2011  . ARTHRITIS, CHRONIC 01/22/2011  . RHEUMATIC FEVER, HX OF 01/22/2011  . CHICKENPOX, HX OF 01/22/2011  . 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 THYROID CYST 01/22/2011  . Hyperlipidemia, mixed 01/22/2011  . ALLERGIC RHINITIS, SEASONAL 01/22/2011  . GERD 01/22/2011  . IRRITABLE BOWEL SYNDROME 01/22/2011  . Disorder of bone and cartilage 01/22/2011  . CARDIAC MURMUR, HX OF 01/22/2011   Ihor Austin, [place]; [place]  Aldona Lento 08/25/2017, 3:13 PM  [place], [place], [address] Phone: [phone]   Fax:  [phone]  Name: Amanda Carlson MRN: [id number] Date of Birth: 22-May-1946

## 2017-08-30 ENCOUNTER — Ambulatory Visit (HOSPITAL_COMMUNITY): Payer: Medicare Other | Admitting: Physical Therapy

## 2017-08-30 DIAGNOSIS — R293 Abnormal posture: Secondary | ICD-10-CM

## 2017-08-30 DIAGNOSIS — M545 Low back pain: Principal | ICD-10-CM

## 2017-08-30 DIAGNOSIS — R262 Difficulty in walking, not elsewhere classified: Secondary | ICD-10-CM

## 2017-08-30 DIAGNOSIS — G8929 Other chronic pain: Secondary | ICD-10-CM | POA: Diagnosis not present

## 2017-08-30 DIAGNOSIS — M6281 Muscle weakness (generalized): Secondary | ICD-10-CM | POA: Diagnosis not present

## 2017-08-30 DIAGNOSIS — R29898 Other symptoms and signs involving the musculoskeletal system: Secondary | ICD-10-CM

## 2017-08-30 NOTE — Therapy (Signed)
[place], [place], [address] Phone: [phone]   Fax:  [phone]  Physical Therapy Treatment  Patient Details  Name: Amanda Carlson MRN: [id number] Date of Birth: 07-27-1946 Referring Provider: Mosie Lukes, MD  Encounter Date: 08/30/2017      PT End of Session - 08/30/17 1154    Visit Number 8   Number of Visits 17   Date for PT Re-Evaluation 09/01/17   Authorization Type Medicare Part A and B   Authorization Time Period 08/04/17 to 09/29/17   Authorization - Visit Number 8   Authorization - Number of Visits 10   PT Start Time 1120   PT Stop Time 1200   PT Time Calculation (min) 40 min   Activity Tolerance Patient tolerated treatment well;Patient limited by fatigue   Behavior During Therapy [facility] for tasks assessed/performed      Past Medical History:  Diagnosis Date  . ALLERGIC RHINITIS, SEASONAL 01/22/2011   Qualifier: Diagnosis of  By: Charlett Blake MD, Erline Levine    . Arthritis   . Breast cancer screening 01/18/2014   Calcified Right side? Follows at [place]   . Bronchitis    hx of  . Cervical cancer screening 04/11/2014  . CTS (carpal tunnel syndrome) 06/07/2012  . Diabetes mellitus type 2 in obese ([place]) 06/29/2015  . Diabetes mellitus without complication ([place])   . Difficulty swallowing    Liquids and foods at times  . Family history of adverse reaction to anesthesia    Pts mother has a difficult time waking up after anesthesia  . GERD 01/22/2011   Qualifier: Diagnosis of  By: Charlett Blake MD, Erline Levine    . HYPOTHYROIDISM 01/22/2011   Qualifier: Diagnosis of  By: Charlett Blake MD, Boyce Medici with Dr Chalmers Cater   . Insomnia 06/07/2012  . Intrinsic asthma, unspecified 01/22/2011   Qualifier: Diagnosis of  By: Charlett Blake MD, Erline Levine    . Irritable bowel syndrome 01/22/2011   Qualifier: Diagnosis of  By: Charlett Blake MD, Erline Levine    . Knee pain, bilateral 10/16/2011  . Knee pain, right 10/16/2011  . Lesion of breast 01/18/2014   Calcified Right  side? Follows at [place]  . Low back pain 10/04/2012  . Medicare annual wellness visit, subsequent 04/14/2014  . Numbness and tingling in hands   . Overweight(278.02) 01/22/2011   Qualifier: Diagnosis of  By: Charlett Blake MD, Erline Levine    . Pulmonary nodules 06/14/2017  . Rheumatic fever    as a child  . Shortness of breath dyspnea    with ambulation  . Sleep apnea    wears CPAP machine  . 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  SCC of tongue.    . THYROID CYST 01/22/2011   Qualifier: Diagnosis of  By: Charlett Blake MD, Erline Levine    . Tinea corporis 06/07/2012  . Tinea corporis 02/22/2016  . Tongue cancer ([place])   . Tongue lesion 01/18/2014    Past Surgical History:  Procedure Laterality Date  . cervical fusion, discectomy, metal plate and 2 screws in place    . CHOLECYSTECTOMY    . EXCISION OF TONGUE LESION Left 07/11/2015   Procedure: EXCISION OF TONGUE LESION;  Surgeon: Melissa Montane, MD;  Location: [facility];  Service: ENT;  Laterality: Left;  . KNEE ARTHROSCOPY     right  . RADICAL NECK DISSECTION Left 07/11/2015   Procedure: RADICAL NECK DISSECTION;  Surgeon: Melissa Montane, MD;  Location: [facility]  OR;  Service: ENT;  Laterality: Left;  Left neck dissection and left tongue resection  . THYROIDECTOMY, PARTIAL    . TUBAL LIGATION      There were no vitals filed for this visit.                       [place] Adult PT Treatment/Exercise - 08/30/17 0001      Lumbar Exercises: Stretches   Lower Trunk Rotation 5 reps;10 seconds   Hip Flexor Stretch Limitations   Hip Flexor Stretch Limitations prone with pillow under hip manual stretch   Press Ups 5 reps   Press Ups Limitations 2 pillows under hips, 3-5" holds    Quad Stretch 3 reps;30 seconds   Quad Stretch Limitations Prone quad stretch with 2 pillows under hips 5x30 sec each     Lumbar Exercises: Supine   Bridge 10 reps     Manual Therapy   Manual Therapy Soft tissue mobilization   Manual therapy  comments prone (2 pillows under hips); completed separate rest of treatment   Soft tissue mobilization bil glutes, piriformis, lumbar paraspinals with green ball   Passive ROM hip flexors, IR/ER and knees                  PT Short Term Goals - 08/11/17 1349      PT SHORT TERM GOAL #1   Title Pt will be independent with HEP and consistently perform in order to maximize return to PLOF.   Time 4   Period Weeks   Status On-going     PT SHORT TERM GOAL #2   Title Pt will have improved knee extension AROM by 10 deg or > to demonstrate improved quad/hip flexor flexibility in order to maximize gait and decrease LBP.   Time 4   Period Weeks   Status On-going     PT SHORT TERM GOAL #3   Title Pt will have improved tolerance to standing and walking to 30 mins or > with 2 or fewer rest breaks required and 3/10 LBP or < to maximize her ability to perform chores for her mother   Time 4   Period Weeks   Status On-going           PT Long Term Goals - 08/11/17 1349      PT LONG TERM GOAL #1   Title Pt will have improved MMT by 1 grade or > in all tested muscle groups to demonstrate improved strength, decrease pain, and maximize gait.   Time 8   Period Weeks   Status On-going     PT LONG TERM GOAL #2   Title Pt will have improved 5xSTS to 15 sec or < from chair with no UE support to demonstrate decreased pain and improved functional BLE strength.   Time 8   Period Weeks   Status On-going     PT LONG TERM GOAL #3   Title Pt will have improved TUG with LRAD to 15 sec or < and with min to no gait deviations to demonstrate improved overall balance and function in order to promote participation at home and in the community.   Time 8   Period Weeks   Status On-going     PT LONG TERM GOAL #4   Title Pt will be able to perform SLS on BLE for 10 sec or > with no UE support to improve balance and maximize gait.   Time 8   Period  Weeks   Status On-going     PT LONG TERM GOAL #5    Title Pt will have bil knee AROM to [facility] to demonstrate improved overall quad/hip flexor flexibility in order to decrease pain and maximize gait.   Time 8   Period Weeks   Status On-going     PT LONG TERM GOAL #6   Title Pt will have improved lumbar extension AROM to 50% limited or better to decrease pain and demonstrate improved flexibility in order to maximize gait and overall function.   Time 8   Period Weeks   Status New               Plan - 08/30/17 1155    Clinical Impression Statement Began session with soft tissue mobilization and PROM to lumbar and hip musculature in prone lying.  Noted improvment in lumbar and hip mobility.  More stiffness noted in Lt hip and glute muscular with manual techniques and addition of supine clams.  Pt utilizes logroll technique with bed mobility.  Improved posturing with ambulation.    Rehab Potential Fair   PT Frequency 2x / week   PT Duration 8 weeks   PT Treatment/Interventions ADLs/Self Care Home Management;Cryotherapy;Electrical Stimulation;Moist Heat;DME Instruction;Gait training;Stair training;Functional mobility training;Therapeutic activities;Therapeutic exercise;Balance training;Neuromuscular re-education;Patient/family education;Manual techniques;Passive range of motion;Dry needling;Energy conservation;Taping   PT Next Visit Plan Continue to progress improving hip mobility and LE strength.  Progress postural strengthening.  Work on gait stability and form.  Next session begin sit to stand and postural theraband strengthening.    PT Home Exercise Plan eval: supine hip flexor stretch  8/22:  3D hip excursions, supine bridge, pelvic tilts, clams; 8/29: prone quad stretch with rope and pillows under hips      Patient will benefit from skilled therapeutic intervention in order to improve the following deficits and impairments:  Abnormal gait, Decreased activity tolerance, Decreased balance, Decreased endurance, Decreased mobility,  Decreased range of motion, Decreased strength, Difficulty walking, Hypomobility, Increased muscle spasms, Impaired flexibility, Improper body mechanics, Postural dysfunction, Pain  Visit Diagnosis: Chronic bilateral low back pain without sciatica  Muscle weakness (generalized)  Difficulty in walking, not elsewhere classified  Abnormal posture  Other symptoms and signs involving the musculoskeletal system     Problem List Patient Active Problem List   Diagnosis Date Noted  . Encephalopathy 07/20/2017  . Pulmonary nodules 06/14/2017  . Carotid artery disease ([place]) 06/13/2016  . Flank pain 02/22/2016  . Cancer of border of tongue ([place]) 07/11/2015  . Diabetes mellitus type 2 in obese ([place]) 06/29/2015  . Radiculopathy of leg 11/18/2014  . Medicare annual wellness visit, subsequent 04/14/2014  . Urine frequency 04/14/2014  . Cervical cancer screening 04/11/2014  . Obesity 01/20/2014  . 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 Breast cancer screening 01/18/2014  . Low back pain 10/04/2012  . CTS (carpal tunnel syndrome) 06/07/2012  . Tinea corporis 06/07/2012  . Insomnia 06/07/2012  . Knee pain, bilateral 10/16/2011  . Hypothyroidism 01/22/2011  . HTN (hypertension) 01/22/2011  . SLEEP APNEA, OBSTRUCTIVE 01/22/2011  . INTRINSIC ASTHMA, UNSPECIFIED 01/22/2011  . ARTHRITIS, CHRONIC 01/22/2011  . RHEUMATIC FEVER, HX OF 01/22/2011  . CHICKENPOX, HX OF 01/22/2011  . 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 THYROID CYST 01/22/2011  . Hyperlipidemia, mixed 01/22/2011  . ALLERGIC RHINITIS, SEASONAL 01/22/2011  . GERD 01/22/2011  . IRRITABLE BOWEL SYNDROME 01/22/2011  . Disorder of bone and cartilage 01/22/2011  . CARDIAC MURMUR, HX OF 01/22/2011   Mackay Hanauer  Sula Soda, PTA/CLT [phone]  Teena Irani 08/30/2017, 12:02 PM  [place] [address] [place], [place], [address] Phone: [phone]   Fax:  [phone]  Name:  ZAFIRAH VANZEE MRN: [id number] Date of Birth: 16-Dec-1946

## 2017-09-01 ENCOUNTER — Ambulatory Visit (HOSPITAL_COMMUNITY): Payer: Medicare Other

## 2017-09-01 ENCOUNTER — Encounter (HOSPITAL_COMMUNITY): Payer: Self-pay

## 2017-09-01 DIAGNOSIS — M545 Low back pain, unspecified: Secondary | ICD-10-CM

## 2017-09-01 DIAGNOSIS — R29898 Other symptoms and signs involving the musculoskeletal system: Secondary | ICD-10-CM | POA: Diagnosis not present

## 2017-09-01 DIAGNOSIS — M6281 Muscle weakness (generalized): Secondary | ICD-10-CM | POA: Diagnosis not present

## 2017-09-01 DIAGNOSIS — R293 Abnormal posture: Secondary | ICD-10-CM

## 2017-09-01 DIAGNOSIS — R262 Difficulty in walking, not elsewhere classified: Secondary | ICD-10-CM

## 2017-09-01 DIAGNOSIS — G8929 Other chronic pain: Secondary | ICD-10-CM

## 2017-09-01 NOTE — Therapy (Signed)
[place], [place], [address] Phone: [phone]   Fax:  [phone]  Physical Therapy Treatment/Reassessment  Patient Details  Name: Amanda Carlson MRN: [id number] Date of Birth: [birth year] Referring Provider: Mosie Lukes, MD  Encounter Date: 09/01/2017      PT End of Session - 09/01/17 1036    Visit Number 9   Number of Visits 17   Date for PT Re-Evaluation 09/29/17   Authorization Type Medicare Part A and B   Authorization Time Period 08/04/17 to 09/29/17   Authorization - Visit Number 9   Authorization - Number of Visits 10   PT Start Time 1033   PT Stop Time 1117   PT Time Calculation (min) 44 min   Activity Tolerance Patient tolerated treatment well;Patient limited by fatigue   Behavior During Therapy [facility] for tasks assessed/performed      Past Medical History:  Diagnosis Date  . ALLERGIC RHINITIS, SEASONAL 01/22/2011   Qualifier: Diagnosis of  By: Charlett Blake MD, Erline Levine    . Arthritis   . Breast cancer screening 01/18/2014   Calcified Right side? Follows at [place]   . Bronchitis    hx of  . Cervical cancer screening 04/11/2014  . CTS (carpal tunnel syndrome) 06/07/2012  . Diabetes mellitus type 2 in obese ([place]) 06/29/2015  . Diabetes mellitus without complication ([place])   . Difficulty swallowing    Liquids and foods at times  . Family history of adverse reaction to anesthesia    Pts mother has a difficult time waking up after anesthesia  . GERD 01/22/2011   Qualifier: Diagnosis of  By: Charlett Blake MD, Erline Levine    . HYPOTHYROIDISM 01/22/2011   Qualifier: Diagnosis of  By: Charlett Blake MD, Boyce Medici with Dr Chalmers Cater   . Insomnia 06/07/2012  . Intrinsic asthma, unspecified 01/22/2011   Qualifier: Diagnosis of  By: Charlett Blake MD, Erline Levine    . Irritable bowel syndrome 01/22/2011   Qualifier: Diagnosis of  By: Charlett Blake MD, Erline Levine    . Knee pain, bilateral 10/16/2011  . Knee pain, right 10/16/2011  . Lesion of breast 01/18/2014   Calcified Right side? Follows at [place]  . Low back pain 10/04/2012  . Medicare annual wellness visit, subsequent 04/14/2014  . Numbness and tingling in hands   . Overweight(278.02) 01/22/2011   Qualifier: Diagnosis of  By: Charlett Blake MD, Erline Levine    . Pulmonary nodules 06/14/2017  . Rheumatic fever    as a child  . Shortness of breath dyspnea    with ambulation  . Sleep apnea    wears CPAP machine  . 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  SCC of tongue.    . THYROID CYST 01/22/2011   Qualifier: Diagnosis of  By: Charlett Blake MD, Erline Levine    . Tinea corporis 06/07/2012  . Tinea corporis 02/22/2016  . Tongue cancer ([place])   . Tongue lesion 01/18/2014    Past Surgical History:  Procedure Laterality Date  . cervical fusion, discectomy, metal plate and 2 screws in place    . CHOLECYSTECTOMY    . EXCISION OF TONGUE LESION Left 07/11/2015   Procedure: EXCISION OF TONGUE LESION;  Surgeon: Melissa Montane, MD;  Location: [facility];  Service: ENT;  Laterality: Left;  . KNEE ARTHROSCOPY     right  . RADICAL NECK DISSECTION Left 07/11/2015   Procedure: RADICAL NECK DISSECTION;  Surgeon: Melissa Montane, MD;  Location: [facility];  Service: ENT;  Laterality: Left;  Left neck dissection and left tongue resection  . THYROIDECTOMY, PARTIAL    . TUBAL LIGATION      There were no vitals filed for this visit.      Subjective Assessment - 09/01/17 1038    Subjective Pt states that she feels pretty good. She can tell a difference already in her therapy. Her L groin is bugging her this morning.   Patient Stated Goals to walk without a cane and walk normally    Currently in Pain? Yes   Pain Score 5   pain across hips is more of a numbness   Pain Location Hip   Pain Orientation Left   Pain Descriptors / Indicators Tightness;Numbness   Pain Type Chronic pain   Pain Onset More than a month ago   Pain Frequency Constant   Aggravating Factors  getting up and down, walking   Pain  Relieving Factors rest, stretches   Effect of Pain on Daily Activities increases            [facility] PT Assessment - 09/01/17 0001      AROM   Lumbar Flexion 50% limited   Lumbar Extension 90% limited, very minimal lumbar extension   Lumbar - Left Rotation WNL     Strength   Right Hip Flexion 5/5  was 4+   Right Hip Extension 4-/5   Right Hip ABduction 4/5  was 3+   Left Hip Flexion 4+/5  was 4   Left Hip Extension 4-/5   Left Hip ABduction 4-/5   Right Knee Flexion 4+/5  was 4+   Right Knee Extension 4+/5  was 4+   Left Knee Flexion 4+/5  was 4+   Left Knee Extension 4+/5  was 4+     Static Standing Balance   Static Standing - Balance Support No upper extremity supported   Static Standing Balance -  Activities  Single Leg Stance - Right Leg;Single Leg Stance - Left Leg   Static Standing - Comment/# of Minutes R: 5 sec no UE  L: unable due to unsteadiness/weakness     Standardized Balance Assessment   Standardized Balance Assessment Five Times Sit to Stand;Timed Up and Go Test   Five times sit to stand comments  28 sec from chair, BUE support      Timed Up and Go Test   TUG Normal TUG   Normal TUG (seconds) 24  rollator; 55 sec with [facility]                 PT Education - 09/01/17 1120    Education provided Yes   Education Details reassessment findings, POC, HEP   Person(s) Educated Patient   Methods Explanation;Demonstration;Handout   Comprehension Verbalized understanding;Returned demonstration          PT Short Term Goals - 09/01/17 1041      PT SHORT TERM GOAL #1   Title Pt will be independent with HEP and consistently perform in order to maximize return to PLOF.   Time 4   Period Weeks   Status Achieved     PT SHORT TERM GOAL #2   Title Pt will have improved knee extension AROM by 10 deg or > to demonstrate improved quad/hip flexor flexibility in order to maximize gait and decrease LBP.   Baseline 9/13: RLE knee ext lacking 8 deg from full  extension, LLE lacking 25 deg; hip extension measured in prone: R and L lacking 23 deg from  neutral   Time 4   Period Weeks   Status Partially Met     PT SHORT TERM GOAL #3   Title Pt will have improved tolerance to standing and walking to 30 mins or > with 2 or fewer rest breaks required and 3/10 LBP or < to maximize her ability to perform chores for her mother   Baseline 9/13: standing 15-20 mins without having to lean on the counter, pain gets up to about 3-4/10; walking with rollator 30-40 mins comfortably before she has to take a break and/or bend over, pain gets up to about 5-6/10   Time 4   Period Weeks   Status Partially Met           PT Long Term Goals - 09/01/17 1045      PT LONG TERM GOAL #1   Title Pt will have improved MMT by 1 grade or > in all tested muscle groups to demonstrate improved strength, decrease pain, and maximize gait.   Baseline 9/13: MMT improved by 1/2 grade   Time 8   Period Weeks   Status Partially Met     PT LONG TERM GOAL #2   Title Pt will have improved 5xSTS to 15 sec or < from chair with no UE support to demonstrate decreased pain and improved functional BLE strength.   Baseline 9/13: 28 sec with BUE   Time 8   Period Weeks   Status On-going     PT LONG TERM GOAL #3   Title Pt will have improved TUG with LRAD to 15 sec or < and with min to no gait deviations to demonstrate improved overall balance and function in order to promote participation at home and in the community.   Baseline 9/13: 55 sec with SPC, 24 sec with rollator   Time 8   Period Weeks   Status On-going     PT LONG TERM GOAL #4   Title Pt will be able to perform SLS on BLE for 10 sec or > with no UE support to improve balance and maximize gait.   Baseline 9/13: R: 5 sec no UE, unable on L due to weakness/unsteadiness   Time 8   Period Weeks   Status On-going     PT LONG TERM GOAL #5   Title Pt will have bil knee AROM to [facility] to demonstrate improved overall quad/hip  flexor flexibility in order to decrease pain and maximize gait.   Time 8   Period Weeks   Status On-going     PT LONG TERM GOAL #6   Title Pt will have improved lumbar extension AROM to 50% limited or better to decrease pain and demonstrate improved flexibility in order to maximize gait and overall function.   Baseline 9/13: still significantly limited in extension   Time 8   Period Weeks   Status On-going               Plan - 09/01/17 1120    Clinical Impression Statement PT reassessed pt's goals and outcome measures this date. Pt is making great progress towards all of her goals, especially in her hip flexor flexibility. Pt able to tolerate prone with no pillows for 1 min this date, something she could not do during her initial evaluation. PT able to MMT rest of hip musculature and get hip extension ROM this date; pt lacking 23 deg from hip extension in prone and in supine she is able to extend both knees further  than at initial eval. Pt has met 1 and partially met 3 goals, while all others are still on-going, however, she has made progress towards each. POC will continue as planned going forward.    Rehab Potential Fair   PT Frequency 2x / week   PT Duration 8 weeks   PT Treatment/Interventions ADLs/Self Care Home Management;Cryotherapy;Electrical Stimulation;Moist Heat;DME Instruction;Gait training;Stair training;Functional mobility training;Therapeutic activities;Therapeutic exercise;Balance training;Neuromuscular re-education;Patient/family education;Manual techniques;Passive range of motion;Dry needling;Energy conservation;Taping   PT Next Visit Plan Continue to progress improving hip mobility and LE strength.  Progress postural strengthening.  Work on gait stability and form.  Next session begin sit to stand and postural theraband strengthening; add lying prone without pillows for 1-2 mins at beginning of session and trial prone press ups   PT Home Exercise Plan eval: supine hip  flexor stretch  8/22:  3D hip excursions, supine bridge, pelvic tilts, clams; 8/29: prone quad stretch with rope and pillows under hips; 29-Sep-2023: supine frog stretch   Consulted and Agree with Plan of Care Patient      Patient will benefit from skilled therapeutic intervention in order to improve the following deficits and impairments:  Abnormal gait, Decreased activity tolerance, Decreased balance, Decreased endurance, Decreased mobility, Decreased range of motion, Decreased strength, Difficulty walking, Hypomobility, Increased muscle spasms, Impaired flexibility, Improper body mechanics, Postural dysfunction, Pain  Visit Diagnosis: Chronic bilateral low back pain without sciatica  Muscle weakness (generalized)  Difficulty in walking, not elsewhere classified  Abnormal posture  Other symptoms and signs involving the musculoskeletal system       G-Codes - 2017/09/28 1121    Functional Assessment Tool Used (Outpatient Only) clinical judgement, 5xSTS, TUG, MMT, AROM, flexbility   Functional Limitation Mobility: Walking and moving around   Mobility: Walking and Moving Around Current Status [phone]) At least 60 percent but less than 80 percent impaired, limited or restricted   Mobility: Walking and Moving Around Goal Status [phone]) At least 20 percent but less than 40 percent impaired, limited or restricted      Problem List Patient Active Problem List   Diagnosis Date Noted  . Encephalopathy 07/20/2017  . Pulmonary nodules 06/14/2017  . Carotid artery disease ([facility]) 06/13/2016  . Flank pain 02/22/2016  . Cancer of border of tongue ([facility]) 07/11/2015  . Diabetes mellitus type 2 in obese ([facility]) 06/29/2015  . Radiculopathy of leg 11/18/2014  . Medicare annual wellness visit, subsequent 04/14/2014  . Urine frequency 04/14/2014  . Cervical cancer screening 04/11/2014  . Obesity 01/20/2014  . Squamous cell carcinoma of tongue ([facility]) 01/18/2014  . Breast cancer screening 01/18/2014  . Low  back pain 10/04/2012  . CTS (carpal tunnel syndrome) 06/07/2012  . Tinea corporis 06/07/2012  . Insomnia 06/07/2012  . Knee pain, bilateral 10/16/2011  . Hypothyroidism 01/22/2011  . HTN (hypertension) 01/22/2011  . SLEEP APNEA, OBSTRUCTIVE 01/22/2011  . INTRINSIC ASTHMA, UNSPECIFIED 01/22/2011  . ARTHRITIS, CHRONIC 01/22/2011  . RHEUMATIC FEVER, HX OF 01/22/2011  . CHICKENPOX, HX OF 01/22/2011  . 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 THYROID CYST 01/22/2011  . Hyperlipidemia, mixed 01/22/2011  . ALLERGIC RHINITIS, SEASONAL 01/22/2011  . GERD 01/22/2011  . IRRITABLE BOWEL SYNDROME 01/22/2011  . Disorder of bone and cartilage 01/22/2011  . CARDIAC MURMUR, HX OF 01/22/2011       Jac Canavan PT, DPT  [facility] [address] [place], [place], [address] Phone: [phone]   Fax:  [phone]  Name: Amanda Carlson MRN: [id number] Date  of Birth: February 26, 1946

## 2017-09-01 NOTE — Patient Instructions (Signed)
  FROG Hip adductor and groin stretch supine  Lying supine with the knees bent, feet together, let the knees fall out to the side until a stretch is felt through the groin.    Perform 2x/day, 3-5 stretches holding for 1-2 mins each

## 2017-09-05 ENCOUNTER — Ambulatory Visit (HOSPITAL_COMMUNITY): Payer: Medicare Other | Admitting: Physical Therapy

## 2017-09-05 ENCOUNTER — Telehealth (HOSPITAL_COMMUNITY): Payer: Self-pay | Admitting: Family Medicine

## 2017-09-05 NOTE — Telephone Encounter (Signed)
09/05/17  has no one to sit with her mom will add to the wait list

## 2017-09-08 ENCOUNTER — Ambulatory Visit (HOSPITAL_COMMUNITY): Payer: Medicare Other | Admitting: Physical Therapy

## 2017-09-08 DIAGNOSIS — R262 Difficulty in walking, not elsewhere classified: Secondary | ICD-10-CM

## 2017-09-08 DIAGNOSIS — M545 Low back pain: Principal | ICD-10-CM

## 2017-09-08 DIAGNOSIS — M6281 Muscle weakness (generalized): Secondary | ICD-10-CM | POA: Diagnosis not present

## 2017-09-08 DIAGNOSIS — R293 Abnormal posture: Secondary | ICD-10-CM

## 2017-09-08 DIAGNOSIS — R29898 Other symptoms and signs involving the musculoskeletal system: Secondary | ICD-10-CM | POA: Diagnosis not present

## 2017-09-08 DIAGNOSIS — G8929 Other chronic pain: Secondary | ICD-10-CM | POA: Diagnosis not present

## 2017-09-08 NOTE — Therapy (Signed)
[place], [place], [address] Phone: [phone]   Fax:  [phone]  Physical Therapy Treatment  Patient Details  Name: Amanda Carlson MRN: [id number] Date of Birth: 26-Nov-1946 Referring Provider: Mosie Lukes, MD  Encounter Date: 09/08/2017      PT End of Session - 09/08/17 1159    Visit Number 10   Number of Visits 17   Date for PT Re-Evaluation 09/29/17   Authorization Type Medicare Part A and B   Authorization Time Period 08/04/17 to 09/29/17 gcodes done visit 9   Authorization - Visit Number 10   Authorization - Number of Visits 19   PT Start Time 1116   PT Stop Time 1205   PT Time Calculation (min) 49 min   Activity Tolerance Patient tolerated treatment well;Patient limited by fatigue   Behavior During Therapy [facility] for tasks assessed/performed      Past Medical History:  Diagnosis Date  . ALLERGIC RHINITIS, SEASONAL 01/22/2011   Qualifier: Diagnosis of  By: Charlett Blake MD, Erline Levine    . Arthritis   . Breast cancer screening 01/18/2014   Calcified Right side? Follows at [place]   . Bronchitis    hx of  . Cervical cancer screening 04/11/2014  . CTS (carpal tunnel syndrome) 06/07/2012  . Diabetes mellitus type 2 in obese ([place]) 06/29/2015  . Diabetes mellitus without complication ([place])   . Difficulty swallowing    Liquids and foods at times  . Family history of adverse reaction to anesthesia    Pts mother has a difficult time waking up after anesthesia  . GERD 01/22/2011   Qualifier: Diagnosis of  By: Charlett Blake MD, Erline Levine    . HYPOTHYROIDISM 01/22/2011   Qualifier: Diagnosis of  By: Charlett Blake MD, Boyce Medici with Dr Chalmers Cater   . Insomnia 06/07/2012  . Intrinsic asthma, unspecified 01/22/2011   Qualifier: Diagnosis of  By: Charlett Blake MD, Erline Levine    . Irritable bowel syndrome 01/22/2011   Qualifier: Diagnosis of  By: Charlett Blake MD, Erline Levine    . Knee pain, bilateral 10/16/2011  . Knee pain, right 10/16/2011  . Lesion of breast  01/18/2014   Calcified Right side? Follows at [place]  . Low back pain 10/04/2012  . Medicare annual wellness visit, subsequent 04/14/2014  . Numbness and tingling in hands   . Overweight(278.02) 01/22/2011   Qualifier: Diagnosis of  By: Charlett Blake MD, Erline Levine    . Pulmonary nodules 06/14/2017  . Rheumatic fever    as a child  . Shortness of breath dyspnea    with ambulation  . Sleep apnea    wears CPAP machine  . 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  SCC of tongue.    . THYROID CYST 01/22/2011   Qualifier: Diagnosis of  By: Charlett Blake MD, Erline Levine    . Tinea corporis 06/07/2012  . Tinea corporis 02/22/2016  . Tongue cancer ([place])   . Tongue lesion 01/18/2014    Past Surgical History:  Procedure Laterality Date  . cervical fusion, discectomy, metal plate and 2 screws in place    . CHOLECYSTECTOMY    . EXCISION OF TONGUE LESION Left 07/11/2015   Procedure: EXCISION OF TONGUE LESION;  Surgeon: Melissa Montane, MD;  Location: [place];  Service: ENT;  Laterality: Left;  . KNEE ARTHROSCOPY     right  . RADICAL NECK DISSECTION Left 07/11/2015   Procedure: RADICAL NECK DISSECTION;  Surgeon: Melissa Montane,  MD;  [facility];  Service: ENT;  Laterality: Left;  Left neck dissection and left tongue resection  . THYROIDECTOMY, PARTIAL    . TUBAL LIGATION      There were no vitals filed for this visit.      Subjective Assessment - 09/08/17 1128    Subjective pt states she is stiff and tired today due to being up with her mother most of the night.  6/10 pain but mostly stiffness exacerbating her symptoms.   Currently in Pain? Yes   Pain Score 6    Pain Location Hip   Pain Orientation Left   Pain Descriptors / Indicators Aching;Tightness                         [facility] Adult PT Treatment/Exercise - 09/08/17 0001      Lumbar Exercises: Stretches   Hip Flexor Stretch Limitations   Hip Flexor Stretch Limitations without pillows X 2 minutes    Press Ups 5 reps   Press Ups Limitations 1 pillows under hips, 3-5" holds    Quad Stretch 3 reps;30 seconds   Quad Stretch Limitations Prone quad stretch with 2 pillows under hips 5x30 sec each     Lumbar Exercises: Seated   Sit to Stand 5 reps   Sit to Stand Limitations with foam riser and cues to shift weight to Lt   Other Seated Lumbar Exercises rows with RTB seated 10X2 reps     Lumbar Exercises: Prone   Other Prone Lumbar Exercises heelsqueeze 2X10   Other Prone Lumbar Exercises knee flexion 2X10     Manual Therapy   Manual Therapy Soft tissue mobilization   Manual therapy comments prone (1 pillows under hips); completed separate rest of treatment   Soft tissue mobilization bil glutes, piriformis, lumbar paraspinals with green ball                PT Education - 09/08/17 1207    Education provided Yes   Education Details updated HEP with prone exercises   Person(s) Educated Patient   Methods Explanation;Demonstration;Tactile cues;Verbal cues;Handout   Comprehension Verbalized understanding;Returned demonstration;Verbal cues required;Tactile cues required          PT Short Term Goals - 09/01/17 1041      PT SHORT TERM GOAL #1   Title Pt will be independent with HEP and consistently perform in order to maximize return to PLOF.   Time 4   Period Weeks   Status Achieved     PT SHORT TERM GOAL #2   Title Pt will have improved knee extension AROM by 10 deg or > to demonstrate improved quad/hip flexor flexibility in order to maximize gait and decrease LBP.   Baseline 9/13: RLE knee ext lacking 8 deg from full extension, LLE lacking 25 deg; hip extension measured in prone: R and L lacking 23 deg from neutral   Time 4   Period Weeks   Status Partially Met     PT SHORT TERM GOAL #3   Title Pt will have improved tolerance to standing and walking to 30 mins or > with 2 or fewer rest breaks required and 3/10 LBP or < to maximize her ability to perform chores for her  mother   Baseline 9/13: standing 15-20 mins without having to lean on the counter, pain gets up to about 3-4/10; walking with rollator 30-40 mins comfortably before she has to take a break and/or bend over, pain gets up  to about 5-6/10   Time 4   Period Weeks   Status Partially Met           PT Long Term Goals - 09/01/17 1045      PT LONG TERM GOAL #1   Title Pt will have improved MMT by 1 grade or > in all tested muscle groups to demonstrate improved strength, decrease pain, and maximize gait.   Baseline 9/13: MMT improved by 1/2 grade   Time 8   Period Weeks   Status Partially Met     PT LONG TERM GOAL #2   Title Pt will have improved 5xSTS to 15 sec or < from chair with no UE support to demonstrate decreased pain and improved functional BLE strength.   Baseline 9/13: 28 sec with BUE   Time 8   Period Weeks   Status On-going     PT LONG TERM GOAL #3   Title Pt will have improved TUG with LRAD to 15 sec or < and with min to no gait deviations to demonstrate improved overall balance and function in order to promote participation at home and in the community.   Baseline 9/13: 55 sec with SPC, 24 sec with rollator   Time 8   Period Weeks   Status On-going     PT LONG TERM GOAL #4   Title Pt will be able to perform SLS on BLE for 10 sec or > with no UE support to improve balance and maximize gait.   Baseline 9/13: R: 5 sec no UE, unable on L due to weakness/unsteadiness   Time 8   Period Weeks   Status On-going     PT LONG TERM GOAL #5   Title Pt will have bil knee AROM to [facility] to demonstrate improved overall quad/hip flexor flexibility in order to decrease pain and maximize gait.   Time 8   Period Weeks   Status On-going     PT LONG TERM GOAL #6   Title Pt will have improved lumbar extension AROM to 50% limited or better to decrease pain and demonstrate improved flexibility in order to maximize gait and overall function.   Baseline 9/13: still significantly limited in  extension   Time 8   Period Weeks   Status On-going               Plan - 09/08/17 1208    Clinical Impression Statement Pt is doing well with improving gait and postural awareness.  Able to tolerate prone without pillows for 2 minutes today and decrease to 1 pillow while in prone and add therex.  Added seated rows to work on core stab and postural strength.  Sit to stand completed using 1 UE and airex beneath with difficulty due to pain in Rt knee and overall weakness.  Encouraged to shift weight more to Lt as she leans excessively to Rt while transitioning.  Continued with manual with noted improved and overall improved mobiltiy at end of session.   Rehab Potential Fair   PT Frequency 2x / week   PT Duration 8 weeks   PT Treatment/Interventions ADLs/Self Care Home Management;Cryotherapy;Electrical Stimulation;Moist Heat;DME Instruction;Gait training;Stair training;Functional mobility training;Therapeutic activities;Therapeutic exercise;Balance training;Neuromuscular re-education;Patient/family education;Manual techniques;Passive range of motion;Dry needling;Energy conservation;Taping   PT Next Visit Plan Continue to progress improving hip mobility and LE strength. Continue to Progress postural strengthening.  Work on gait stability and form.     PT Home Exercise Plan eval: supine hip flexor stretch  8/22:  3D hip excursions, supine bridge, pelvic tilts, clams; 8/29: prone quad stretch with rope and pillows under hips; 9/13: supine frog stretch   Consulted and Agree with Plan of Care Patient      Patient will benefit from skilled therapeutic intervention in order to improve the following deficits and impairments:  Abnormal gait, Decreased activity tolerance, Decreased balance, Decreased endurance, Decreased mobility, Decreased range of motion, Decreased strength, Difficulty walking, Hypomobility, Increased muscle spasms, Impaired flexibility, Improper body mechanics, Postural dysfunction,  Pain  Visit Diagnosis: Chronic bilateral low back pain without sciatica  Difficulty in walking, not elsewhere classified  Muscle weakness (generalized)  Abnormal posture  Other symptoms and signs involving the musculoskeletal system     Problem List Patient Active Problem List   Diagnosis Date Noted  . Encephalopathy 07/20/2017  . Pulmonary nodules 06/14/2017  . Carotid artery disease ([place]) 06/13/2016  . Flank pain 02/22/2016  . Cancer of border of tongue ([place]) 07/11/2015  . Diabetes mellitus type 2 in obese ([place]) 06/29/2015  . Radiculopathy of leg 11/18/2014  . Medicare annual wellness visit, subsequent 04/14/2014  . Urine frequency 04/14/2014  . Cervical cancer screening 04/11/2014  . Obesity 01/20/2014  . 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 Breast cancer screening 01/18/2014  . Low back pain 10/04/2012  . CTS (carpal tunnel syndrome) 06/07/2012  . Tinea corporis 06/07/2012  . Insomnia 06/07/2012  . Knee pain, bilateral 10/16/2011  . Hypothyroidism 01/22/2011  . HTN (hypertension) 01/22/2011  . SLEEP APNEA, OBSTRUCTIVE 01/22/2011  . INTRINSIC ASTHMA, UNSPECIFIED 01/22/2011  . ARTHRITIS, CHRONIC 01/22/2011  . RHEUMATIC FEVER, HX OF 01/22/2011  . CHICKENPOX, HX OF 01/22/2011  . 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 THYROID CYST 01/22/2011  . Hyperlipidemia, mixed 01/22/2011  . ALLERGIC RHINITIS, SEASONAL 01/22/2011  . GERD 01/22/2011  . IRRITABLE BOWEL SYNDROME 01/22/2011  . Disorder of bone and cartilage 01/22/2011  . CARDIAC MURMUR, HX OF 01/22/2011   Teena Irani, PTA/CLT [phone]  Teena Irani 09/08/2017, 12:13 PM  [place], [place], [address] Phone: [phone]   Fax:  [phone]  Name: Amanda Carlson MRN: [id number] Date of Birth: 18-Dec-1946

## 2017-09-08 NOTE — Patient Instructions (Addendum)
KNEE: Flexion - Prone    Bend knee. Raise heel toward buttocks. Do not raise hips. _10__ reps per set, ___ sets per day, _2__ times per day  Trunk: Prone Extension (Press-Ups)    Lie on stomach on firm, flat surface. Relax bottom and legs. Raise chest in air with elbows straight. Keep hips flat on surface, sag stomach. Repeat _5_ times. Do _2_ sessions per day. CAUTION: Movement should be gentle and slow.  Heel Squeeze (Prone)    Abdomen supported, bend knees and gently squeeze heels together. Hold _5_ seconds.Repeat _10_ times per set. Do _2_ sessions per day.

## 2017-09-12 ENCOUNTER — Encounter (HOSPITAL_COMMUNITY): Payer: Self-pay

## 2017-09-12 ENCOUNTER — Ambulatory Visit (HOSPITAL_COMMUNITY): Payer: Medicare Other

## 2017-09-12 DIAGNOSIS — R293 Abnormal posture: Secondary | ICD-10-CM

## 2017-09-12 DIAGNOSIS — M6281 Muscle weakness (generalized): Secondary | ICD-10-CM | POA: Diagnosis not present

## 2017-09-12 DIAGNOSIS — R29898 Other symptoms and signs involving the musculoskeletal system: Secondary | ICD-10-CM

## 2017-09-12 DIAGNOSIS — M545 Low back pain, unspecified: Secondary | ICD-10-CM

## 2017-09-12 DIAGNOSIS — G8929 Other chronic pain: Secondary | ICD-10-CM | POA: Diagnosis not present

## 2017-09-12 DIAGNOSIS — R262 Difficulty in walking, not elsewhere classified: Secondary | ICD-10-CM | POA: Diagnosis not present

## 2017-09-12 NOTE — Therapy (Signed)
[place], [place], [address] Phone: [phone]   Fax:  [phone]  Physical Therapy Treatment  Patient Details  Name: Amanda Carlson MRN: [id number] Date of Birth: 12-12-1946 Referring Provider: Mosie Lukes, MD  Encounter Date: 09/12/2017      PT End of Session - 09/12/17 1117    Visit Number 11   Number of Visits 17   Date for PT Re-Evaluation 09/29/17   Authorization Type Medicare Part A and B   Authorization Time Period 08/04/17 to 09/29/17 gcodes done visit 9   Authorization - Visit Number 11   Authorization - Number of Visits 19   PT Start Time 1119   PT Stop Time 1204   PT Time Calculation (min) 45 min   Activity Tolerance Patient tolerated treatment well;Patient limited by fatigue   Behavior During Therapy [facility] for tasks assessed/performed      Past Medical History:  Diagnosis Date  . ALLERGIC RHINITIS, SEASONAL 01/22/2011   Qualifier: Diagnosis of  By: Charlett Blake MD, Erline Levine    . Arthritis   . Breast cancer screening 01/18/2014   Calcified Right side? Follows at [place]   . Bronchitis    hx of  . Cervical cancer screening 04/11/2014  . CTS (carpal tunnel syndrome) 06/07/2012  . Diabetes mellitus type 2 in obese ([place]) 06/29/2015  . Diabetes mellitus without complication ([place])   . Difficulty swallowing    Liquids and foods at times  . Family history of adverse reaction to anesthesia    Pts mother has a difficult time waking up after anesthesia  . GERD 01/22/2011   Qualifier: Diagnosis of  By: Charlett Blake MD, Erline Levine    . HYPOTHYROIDISM 01/22/2011   Qualifier: Diagnosis of  By: Charlett Blake MD, Boyce Medici with Dr Chalmers Cater   . Insomnia 06/07/2012  . Intrinsic asthma, unspecified 01/22/2011   Qualifier: Diagnosis of  By: Charlett Blake MD, Erline Levine    . Irritable bowel syndrome 01/22/2011   Qualifier: Diagnosis of  By: Charlett Blake MD, Erline Levine    . Knee pain, bilateral 10/16/2011  . Knee pain, right 10/16/2011  . Lesion of breast  01/18/2014   Calcified Right side? Follows at [place]  . Low back pain 10/04/2012  . Medicare annual wellness visit, subsequent 04/14/2014  . Numbness and tingling in hands   . Overweight(278.02) 01/22/2011   Qualifier: Diagnosis of  By: Charlett Blake MD, Erline Levine    . Pulmonary nodules 06/14/2017  . Rheumatic fever    as a child  . Shortness of breath dyspnea    with ambulation  . Sleep apnea    wears CPAP machine  . 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  SCC of tongue.    . THYROID CYST 01/22/2011   Qualifier: Diagnosis of  By: Charlett Blake MD, Erline Levine    . Tinea corporis 06/07/2012  . Tinea corporis 02/22/2016  . Tongue cancer ([place])   . Tongue lesion 01/18/2014    Past Surgical History:  Procedure Laterality Date  . cervical fusion, discectomy, metal plate and 2 screws in place    . CHOLECYSTECTOMY    . EXCISION OF TONGUE LESION Left 07/11/2015   Procedure: EXCISION OF TONGUE LESION;  Surgeon: Melissa Montane, MD;  Location: [facility];  Service: ENT;  Laterality: Left;  . KNEE ARTHROSCOPY     right  . RADICAL NECK DISSECTION Left 07/11/2015   Procedure: RADICAL NECK DISSECTION;  Surgeon: Melissa Montane,  MD;  [facility];  Service: ENT;  Laterality: Left;  Left neck dissection and left tongue resection  . THYROIDECTOMY, PARTIAL    . TUBAL LIGATION      There were no vitals filed for this visit.      Subjective Assessment - 09/12/17 1122    Subjective Pt states that she is a little stiff this morning from sitting. She is still compliant with her HEP.    Currently in Pain? Yes   Pain Score 5    Pain Location Hip   Pain Orientation Right;Left   Pain Descriptors / Indicators Aching;Tightness   Pain Type Chronic pain   Pain Onset More than a month ago   Pain Frequency Constant   Aggravating Factors  getting up and down, walking   Pain Relieving Factors rest, stretches   Effect of Pain on Daily Activities increases                [facility] Adult PT  Treatment/Exercise - 09/12/17 0001      Lumbar Exercises: Stretches   Hip Flexor Stretch Limitations   Hip Flexor Stretch Limitations without pillows x3 mins   Press Ups Limitations 10x3-5" holds on elbows with no pillows   Quad Stretch Limitations prone quad stretch with rope and no pillows 3x15"    Piriformis Stretch Limitations bil hip add stretch manually 3x15-20" each     Lumbar Exercises: Standing   Scapular Retraction Both;20 reps;Theraband   Theraband Level (Scapular Retraction) Level 2 (Red)   Shoulder Adduction Limitations amb in // bars x3 laps with BUE support     Lumbar Exercises: Seated   Sit to Stand 5 reps   Sit to Stand Limitations table raised to 24" with 1-2 UE support     Lumbar Exercises: Supine   Bridge 10 reps   Bridge Limitations 2 sets, with RTB               PT Short Term Goals - 09/01/17 1041      PT SHORT TERM GOAL #1   Title Pt will be independent with HEP and consistently perform in order to maximize return to PLOF.   Time 4   Period Weeks   Status Achieved     PT SHORT TERM GOAL #2   Title Pt will have improved knee extension AROM by 10 deg or > to demonstrate improved quad/hip flexor flexibility in order to maximize gait and decrease LBP.   Baseline 9/13: RLE knee ext lacking 8 deg from full extension, LLE lacking 25 deg; hip extension measured in prone: R and L lacking 23 deg from neutral   Time 4   Period Weeks   Status Partially Met     PT SHORT TERM GOAL #3   Title Pt will have improved tolerance to standing and walking to 30 mins or > with 2 or fewer rest breaks required and 3/10 LBP or < to maximize her ability to perform chores for her mother   Baseline 9/13: standing 15-20 mins without having to lean on the counter, pain gets up to about 3-4/10; walking with rollator 30-40 mins comfortably before she has to take a break and/or bend over, pain gets up to about 5-6/10   Time 4   Period Weeks   Status Partially Met            PT Long Term Goals - 09/01/17 1045      PT LONG TERM GOAL #1   Title Pt will  have improved MMT by 1 grade or > in all tested muscle groups to demonstrate improved strength, decrease pain, and maximize gait.   Baseline 9/13: MMT improved by 1/2 grade   Time 8   Period Weeks   Status Partially Met     PT LONG TERM GOAL #2   Title Pt will have improved 5xSTS to 15 sec or < from chair with no UE support to demonstrate decreased pain and improved functional BLE strength.   Baseline 9/13: 28 sec with BUE   Time 8   Period Weeks   Status On-going     PT LONG TERM GOAL #3   Title Pt will have improved TUG with LRAD to 15 sec or < and with min to no gait deviations to demonstrate improved overall balance and function in order to promote participation at home and in the community.   Baseline 9/13: 55 sec with SPC, 24 sec with rollator   Time 8   Period Weeks   Status On-going     PT LONG TERM GOAL #4   Title Pt will be able to perform SLS on BLE for 10 sec or > with no UE support to improve balance and maximize gait.   Baseline 9/13: R: 5 sec no UE, unable on L due to weakness/unsteadiness   Time 8   Period Weeks   Status On-going     PT LONG TERM GOAL #5   Title Pt will have bil knee AROM to [facility] to demonstrate improved overall quad/hip flexor flexibility in order to decrease pain and maximize gait.   Time 8   Period Weeks   Status On-going     PT LONG TERM GOAL #6   Title Pt will have improved lumbar extension AROM to 50% limited or better to decrease pain and demonstrate improved flexibility in order to maximize gait and overall function.   Baseline 9/13: still significantly limited in extension   Time 8   Period Weeks   Status On-going               Plan - 09/12/17 1207    Clinical Impression Statement Session focused on improving upright posture and hip extension ROM. Pt able to tolerate prone position without any pillows and could tolerate quad stretching and  press-ups to elbow without pillows this date. Pt able to perform standing scap retraction with RTB. Gait training in // bars performed; pt required cues for proper heel to toe pattern and cues to decrease UE support. Performed 1 lap with bil HHA and pt putting max pressure into PT's hands for stability. Will continue to work and address this in future sessions.   Rehab Potential Fair   PT Frequency 2x / week   PT Duration 8 weeks   PT Treatment/Interventions ADLs/Self Care Home Management;Cryotherapy;Electrical Stimulation;Moist Heat;DME Instruction;Gait training;Stair training;Functional mobility training;Therapeutic activities;Therapeutic exercise;Balance training;Neuromuscular re-education;Patient/family education;Manual techniques;Passive range of motion;Dry needling;Energy conservation;Taping   PT Next Visit Plan Continue to progress improving hip mobility and LE strength. Continue to Progress postural strengthening.  Work on gait stability and form in // bars with decreasing UE support; add prone hip extension   PT Home Exercise Plan eval: supine hip flexor stretch  8/22:  3D hip excursions, supine bridge, pelvic tilts, clams; 8/29: prone quad stretch with rope and pillows under hips; 9/13: supine frog stretch   Consulted and Agree with Plan of Care Patient      Patient will benefit from skilled therapeutic intervention in order to  improve the following deficits and impairments:  Abnormal gait, Decreased activity tolerance, Decreased balance, Decreased endurance, Decreased mobility, Decreased range of motion, Decreased strength, Difficulty walking, Hypomobility, Increased muscle spasms, Impaired flexibility, Improper body mechanics, Postural dysfunction, Pain  Visit Diagnosis: Chronic bilateral low back pain without sciatica  Difficulty in walking, not elsewhere classified  Muscle weakness (generalized)  Abnormal posture  Other symptoms and signs involving the musculoskeletal  system     Problem List Patient Active Problem List   Diagnosis Date Noted  . Encephalopathy 07/20/2017  . Pulmonary nodules 06/14/2017  . Carotid artery disease ([place]) 06/13/2016  . Flank pain 02/22/2016  . Cancer of border of tongue ([place]) 07/11/2015  . Diabetes mellitus type 2 in obese ([place]) 06/29/2015  . Radiculopathy of leg 11/18/2014  . Medicare annual wellness visit, subsequent 04/14/2014  . Urine frequency 04/14/2014  . Cervical cancer screening 04/11/2014  . Obesity 01/20/2014  . 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 Breast cancer screening 01/18/2014  . Low back pain 10/04/2012  . CTS (carpal tunnel syndrome) 06/07/2012  . Tinea corporis 06/07/2012  . Insomnia 06/07/2012  . Knee pain, bilateral 10/16/2011  . Hypothyroidism 01/22/2011  . HTN (hypertension) 01/22/2011  . SLEEP APNEA, OBSTRUCTIVE 01/22/2011  . INTRINSIC ASTHMA, UNSPECIFIED 01/22/2011  . ARTHRITIS, CHRONIC 01/22/2011  . RHEUMATIC FEVER, HX OF 01/22/2011  . CHICKENPOX, HX OF 01/22/2011  . 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 THYROID CYST 01/22/2011  . Hyperlipidemia, mixed 01/22/2011  . ALLERGIC RHINITIS, SEASONAL 01/22/2011  . GERD 01/22/2011  . IRRITABLE BOWEL SYNDROME 01/22/2011  . Disorder of bone and cartilage 01/22/2011  . CARDIAC MURMUR, HX OF 01/22/2011      Geraldine Solar PT, DPT  [place] [address] [place], [place], [address] Phone: [phone]   Fax:  [phone]  Name: KARESHA TRZCINSKI MRN: [id number] Date of Birth: 05-20-1946

## 2017-09-13 ENCOUNTER — Other Ambulatory Visit: Payer: Self-pay | Admitting: Family Medicine

## 2017-09-13 NOTE — Telephone Encounter (Signed)
Requesting:tramadol Contract:no UDS:no Last OV:06/14/17 Next KG:OVPC Last Refill:08/15/17  #[id number] tk 1 tab tid prn   Please advise

## 2017-09-13 NOTE — Telephone Encounter (Signed)
Needs uds and contract needs visit by December then can have refill

## 2017-09-14 ENCOUNTER — Ambulatory Visit (HOSPITAL_COMMUNITY): Payer: Medicare Other

## 2017-09-14 ENCOUNTER — Encounter (HOSPITAL_COMMUNITY): Payer: Self-pay

## 2017-09-14 DIAGNOSIS — R29898 Other symptoms and signs involving the musculoskeletal system: Secondary | ICD-10-CM | POA: Diagnosis not present

## 2017-09-14 DIAGNOSIS — M6281 Muscle weakness (generalized): Secondary | ICD-10-CM | POA: Diagnosis not present

## 2017-09-14 DIAGNOSIS — G8929 Other chronic pain: Secondary | ICD-10-CM

## 2017-09-14 DIAGNOSIS — M545 Low back pain: Principal | ICD-10-CM

## 2017-09-14 DIAGNOSIS — R293 Abnormal posture: Secondary | ICD-10-CM | POA: Diagnosis not present

## 2017-09-14 DIAGNOSIS — R262 Difficulty in walking, not elsewhere classified: Secondary | ICD-10-CM

## 2017-09-14 NOTE — Patient Instructions (Signed)
  PRONE HIP EXTENSION - BENT  While lying face down with your knee bent, slowly raise up your knee off the ground.  2-3 sets of 5-10 reps on each leg

## 2017-09-14 NOTE — Therapy (Signed)
[facility] [place], [place], [address] Phone: [phone]   Fax:  [phone]  Physical Therapy Treatment  Patient Details  Name: Amanda Carlson MRN: [id number] Date of Birth: 1946/01/29 Referring Provider: Mosie Lukes, MD  Encounter Date: 09/14/2017      PT End of Session - 09/14/17 1124    Visit Number 12   Number of Visits 17   Date for PT Re-Evaluation 09/29/17   Authorization Type Medicare Part A and B   Authorization Time Period 08/04/17 to 09/29/17 gcodes done visit 9   Authorization - Visit Number 12   Authorization - Number of Visits 19   PT Start Time 1120   PT Stop Time 1201   PT Time Calculation (min) 41 min   Activity Tolerance Patient tolerated treatment well;Patient limited by fatigue   Behavior During Therapy [facility] for tasks assessed/performed      Past Medical History:  Diagnosis Date  . ALLERGIC RHINITIS, SEASONAL 01/22/2011   Qualifier: Diagnosis of  By: Charlett Blake MD, Erline Levine    . Arthritis   . Breast cancer screening 01/18/2014   Calcified Right side? Follows at [facility]   . Bronchitis    hx of  . Cervical cancer screening 04/11/2014  . CTS (carpal tunnel syndrome) 06/07/2012  . Diabetes mellitus type 2 in obese ([place]) 06/29/2015  . Diabetes mellitus without complication ([place])   . Difficulty swallowing    Liquids and foods at times  . Family history of adverse reaction to anesthesia    Pts mother has a difficult time waking up after anesthesia  . GERD 01/22/2011   Qualifier: Diagnosis of  By: Charlett Blake MD, Erline Levine    . HYPOTHYROIDISM 01/22/2011   Qualifier: Diagnosis of  By: Charlett Blake MD, Boyce Medici with Dr Chalmers Cater   . Insomnia 06/07/2012  . Intrinsic asthma, unspecified 01/22/2011   Qualifier: Diagnosis of  By: Charlett Blake MD, Erline Levine    . Irritable bowel syndrome 01/22/2011   Qualifier: Diagnosis of  By: Charlett Blake MD, Erline Levine    . Knee pain, bilateral 10/16/2011  . Knee pain, right 10/16/2011  . Lesion of breast  01/18/2014   Calcified Right side? Follows at [facility]  . Low back pain 10/04/2012  . Medicare annual wellness visit, subsequent 04/14/2014  . Numbness and tingling in hands   . Overweight(278.02) 01/22/2011   Qualifier: Diagnosis of  By: Charlett Blake MD, Erline Levine    . Pulmonary nodules 06/14/2017  . Rheumatic fever    as a child  . Shortness of breath dyspnea    with ambulation  . Sleep apnea    wears CPAP machine  . 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  SCC of tongue.    . THYROID CYST 01/22/2011   Qualifier: Diagnosis of  By: Charlett Blake MD, Erline Levine    . Tinea corporis 06/07/2012  . Tinea corporis 02/22/2016  . Tongue cancer ([place])   . Tongue lesion 01/18/2014    Past Surgical History:  Procedure Laterality Date  . cervical fusion, discectomy, metal plate and 2 screws in place    . CHOLECYSTECTOMY    . EXCISION OF TONGUE LESION Left 07/11/2015   Procedure: EXCISION OF TONGUE LESION;  Surgeon: Melissa Montane, MD;  Location: [facility];  Service: ENT;  Laterality: Left;  . KNEE ARTHROSCOPY     right  . RADICAL NECK DISSECTION Left 07/11/2015   Procedure: RADICAL NECK DISSECTION;  Surgeon: Melissa Montane,  MD;  Location: [facility] OR;  Service: ENT;  Laterality: Left;  Left neck dissection and left tongue resection  . THYROIDECTOMY, PARTIAL    . TUBAL LIGATION      There were no vitals filed for this visit.      Subjective Assessment - 09/14/17 1124    Subjective Pt states that she is stiff and tight this morning, about 4/10 currently.    Currently in Pain? Yes   Pain Score 4    Pain Location Hip   Pain Orientation Right;Left   Pain Descriptors / Indicators Aching;Tightness   Pain Type Chronic pain   Pain Onset More than a month ago   Pain Frequency Constant   Aggravating Factors  getting up and down, walking   Pain Relieving Factors rest, stretches   Effect of Pain on Daily Activities increases               [facility] Adult PT Treatment/Exercise - 09/14/17  0001      Lumbar Exercises: Stretches   Hip Flexor Stretch Limitations   Hip Flexor Stretch Limitations without pillows x10 mins during manual     Lumbar Exercises: Standing   Heel Raises 15 reps   Heel Raises Limitations heel and toe   Functional Squats 5 reps   Functional Squats Limitations 2 sets, with BUE support   Lifting Limitations fwd stp ups on 2" step x10 reps each    Scapular Retraction Both;20 reps;Theraband   Theraband Level (Scapular Retraction) Level 3 (Green)   Row Both;20 reps   Theraband Level (Row) Level 3 (Green)   Row Limitations low rows   Shoulder Adduction Limitations amb in // bars with BUE to 1 UE support x3RT     Lumbar Exercises: Prone   Straight Leg Raise 5 reps   Straight Leg Raises Limitations 2 sets, knee bent     Manual Therapy   Manual Therapy Soft tissue mobilization   Manual therapy comments prone no pillows, completed separate rest of treatment   Soft tissue mobilization STM with and without ball to bil glutes, piriformis              PT Education - 09/14/17 1202    Education provided Yes   Education Details added prone hip ext to HEP   Person(s) Educated Patient   Methods Explanation;Demonstration;Handout   Comprehension Verbalized understanding;Returned demonstration          PT Short Term Goals - 09/01/17 1041      PT SHORT TERM GOAL #1   Title Pt will be independent with HEP and consistently perform in order to maximize return to PLOF.   Time 4   Period Weeks   Status Achieved     PT SHORT TERM GOAL #2   Title Pt will have improved knee extension AROM by 10 deg or > to demonstrate improved quad/hip flexor flexibility in order to maximize gait and decrease LBP.   Baseline 9/13: RLE knee ext lacking 8 deg from full extension, LLE lacking 25 deg; hip extension measured in prone: R and L lacking 23 deg from neutral   Time 4   Period Weeks   Status Partially Met     PT SHORT TERM GOAL #3   Title Pt will have improved  tolerance to standing and walking to 30 mins or > with 2 or fewer rest breaks required and 3/10 LBP or < to maximize her ability to perform chores for her mother   Baseline 9/13: standing 15-20  mins without having to lean on the counter, pain gets up to about 3-4/10; walking with rollator 30-40 mins comfortably before she has to take a break and/or bend over, pain gets up to about 5-6/10   Time 4   Period Weeks   Status Partially Met           PT Long Term Goals - 09/01/17 1045      PT LONG TERM GOAL #1   Title Pt will have improved MMT by 1 grade or > in all tested muscle groups to demonstrate improved strength, decrease pain, and maximize gait.   Baseline 9/13: MMT improved by 1/2 grade   Time 8   Period Weeks   Status Partially Met     PT LONG TERM GOAL #2   Title Pt will have improved 5xSTS to 15 sec or < from chair with no UE support to demonstrate decreased pain and improved functional BLE strength.   Baseline 9/13: 28 sec with BUE   Time 8   Period Weeks   Status On-going     PT LONG TERM GOAL #3   Title Pt will have improved TUG with LRAD to 15 sec or < and with min to no gait deviations to demonstrate improved overall balance and function in order to promote participation at home and in the community.   Baseline 9/13: 55 sec with SPC, 24 sec with rollator   Time 8   Period Weeks   Status On-going     PT LONG TERM GOAL #4   Title Pt will be able to perform SLS on BLE for 10 sec or > with no UE support to improve balance and maximize gait.   Baseline 9/13: R: 5 sec no UE, unable on L due to weakness/unsteadiness   Time 8   Period Weeks   Status On-going     PT LONG TERM GOAL #5   Title Pt will have bil knee AROM to [facility] to demonstrate improved overall quad/hip flexor flexibility in order to decrease pain and maximize gait.   Time 8   Period Weeks   Status On-going     PT LONG TERM GOAL #6   Title Pt will have improved lumbar extension AROM to 50% limited or  better to decrease pain and demonstrate improved flexibility in order to maximize gait and overall function.   Baseline 9/13: still significantly limited in extension   Time 8   Period Weeks   Status On-going               Plan - 09/14/17 1202    Clinical Impression Statement Pt steadily improving towards goals. Pt was able to tolerate lying prone with no pillows for 10 mins during manual therapy to bil glutes and piriformis. Functional strengthening, posture, and gait was focus during rest of session. Pt did very well, not reporting any pain, just a little tightness. PT had increased tightness following standing x to v, but following amb in // bars, it decreased to lower than at beginning of session. Continue POC as planned.   Rehab Potential Fair   PT Frequency 2x / week   PT Duration 8 weeks   PT Treatment/Interventions ADLs/Self Care Home Management;Cryotherapy;Electrical Stimulation;Moist Heat;DME Instruction;Gait training;Stair training;Functional mobility training;Therapeutic activities;Therapeutic exercise;Balance training;Neuromuscular re-education;Patient/family education;Manual techniques;Passive range of motion;Dry needling;Energy conservation;Taping   PT Next Visit Plan Continue to progress improving hip mobility and LE strength. Continue to Progress postural strengthening.  Work on gait stability and form in //  bars with decreasing UE support; trial step ups on 4" step   PT Home Exercise Plan eval: supine hip flexor stretch  8/22:  3D hip excursions, supine bridge, pelvic tilts, clams; 8/29: prone quad stretch with rope and pillows under hips; 9/13: supine frog stretch; 9/26: prone hip ext   Consulted and Agree with Plan of Care Patient      Patient will benefit from skilled therapeutic intervention in order to improve the following deficits and impairments:  Abnormal gait, Decreased activity tolerance, Decreased balance, Decreased endurance, Decreased mobility, Decreased  range of motion, Decreased strength, Difficulty walking, Hypomobility, Increased muscle spasms, Impaired flexibility, Improper body mechanics, Postural dysfunction, Pain  Visit Diagnosis: Chronic bilateral low back pain without sciatica  Difficulty in walking, not elsewhere classified  Muscle weakness (generalized)  Abnormal posture  Other symptoms and signs involving the musculoskeletal system     Problem List Patient Active Problem List   Diagnosis Date Noted  . Encephalopathy 07/20/2017  . Pulmonary nodules 06/14/2017  . Carotid artery disease ([place]) 06/13/2016  . Flank pain 02/22/2016  . Cancer of border of tongue ([place]) 07/11/2015  . Diabetes mellitus type 2 in obese ([place]) 06/29/2015  . Radiculopathy of leg 11/18/2014  . Medicare annual wellness visit, subsequent 04/14/2014  . Urine frequency 04/14/2014  . Cervical cancer screening 04/11/2014  . Obesity 01/20/2014  . 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 Breast cancer screening 01/18/2014  . Low back pain 10/04/2012  . CTS (carpal tunnel syndrome) 06/07/2012  . Tinea corporis 06/07/2012  . Insomnia 06/07/2012  . Knee pain, bilateral 10/16/2011  . Hypothyroidism 01/22/2011  . HTN (hypertension) 01/22/2011  . SLEEP APNEA, OBSTRUCTIVE 01/22/2011  . INTRINSIC ASTHMA, UNSPECIFIED 01/22/2011  . ARTHRITIS, CHRONIC 01/22/2011  . RHEUMATIC FEVER, HX OF 01/22/2011  . CHICKENPOX, HX OF 01/22/2011  . 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 THYROID CYST 01/22/2011  . Hyperlipidemia, mixed 01/22/2011  . ALLERGIC RHINITIS, SEASONAL 01/22/2011  . GERD 01/22/2011  . IRRITABLE BOWEL SYNDROME 01/22/2011  . Disorder of bone and cartilage 01/22/2011  . CARDIAC MURMUR, HX OF 01/22/2011       Geraldine Solar PT, DPT  [facility] [address] [place], [place], [address] Phone: [phone]   Fax:  [phone]  Name: JANNETT SCHMALL MRN: [id number] Date of Birth:  Aug 08, 1946

## 2017-09-15 ENCOUNTER — Telehealth: Payer: Self-pay | Admitting: Family Medicine

## 2017-09-15 NOTE — Telephone Encounter (Signed)
Relation to ZM:CEYE Call back [phone]   Reason for call:  Patient checking on the status of medication mentioned below and vitamin D, please advise

## 2017-09-15 NOTE — Telephone Encounter (Signed)
Pt called checking status Tramadol #120 piulls and Vitamin D #4 pills. Pt states it is usually called in to [facility]. Call pt [phone].

## 2017-09-15 NOTE — Telephone Encounter (Signed)
Pt called for status scripts for Tramadol and Vitamin D. Please call pt [phone].

## 2017-09-16 ENCOUNTER — Other Ambulatory Visit: Payer: Self-pay | Admitting: Family Medicine

## 2017-09-16 ENCOUNTER — Other Ambulatory Visit: Payer: Self-pay

## 2017-09-16 MED ORDER — TRAMADOL HCL 50 MG PO TABS: 50.0000 mg | ORAL_TABLET | Freq: Three times a day (TID) | ORAL | 0 refills | Status: DC | PRN

## 2017-09-16 MED ORDER — VITAMIN D (ERGOCALCIFEROL) 1.25 MG (50000 UNIT) PO CAPS: 50000.0000 [IU] | ORAL_CAPSULE | ORAL | 4 refills | Status: AC

## 2017-09-16 NOTE — Telephone Encounter (Signed)
Spoke with Amanda Carlson in the lab and Amanda Carlson they both stated they did not think we could send labs in Epic for [facility].    We could send a hand written rx with labs however they do not use [facility] do patient still needs to come in for UDS.   I called patient and let her know this she stated she will find some time to come and do the UDS and stated she will have the lab work done at her visit.

## 2017-09-16 NOTE — Telephone Encounter (Signed)
Called Amanda Carlson spoke with several people they all said they do not take outside orders only if the patient has or needs to be seen there. They also do not do UDS there either.   PC

## 2017-09-16 NOTE — Telephone Encounter (Signed)
Pt called for update Tramadol and vitamin D. Routed high priority to [facility] and asst.

## 2017-09-16 NOTE — Telephone Encounter (Signed)
Pt was told to come pick up Rx from office/thx dmf

## 2017-09-16 NOTE — Telephone Encounter (Signed)
I have never ordered labs at [facility] please call their lab and ask if they will accept an order from an outside physician that is [facility] employed. If so what do we need to do? Order in EPIC, hand write etc. Also can they do the UDS

## 2017-09-16 NOTE — Telephone Encounter (Signed)
Patient has been notified that her meds are here in the front office ready for pick up. She has also been told she needs to come in for a UDS and a contract. She is aware. Her rx is in the front ready for pick up.    PC

## 2017-09-16 NOTE — Telephone Encounter (Signed)
Pt called states wants to go to [facility] for ALL labs including UDS. She wants [place] to mail her the contract and let her keep her appt with Charlett Blake in Nov instead of coming in sooner. Please call pt to advise.

## 2017-09-18 NOTE — Telephone Encounter (Signed)
Thanks I was afriad of that.

## 2017-09-19 ENCOUNTER — Ambulatory Visit (HOSPITAL_COMMUNITY): Payer: Medicare Other | Attending: Family Medicine

## 2017-09-19 ENCOUNTER — Encounter (HOSPITAL_COMMUNITY): Payer: Self-pay

## 2017-09-19 DIAGNOSIS — R293 Abnormal posture: Secondary | ICD-10-CM | POA: Diagnosis not present

## 2017-09-19 DIAGNOSIS — R262 Difficulty in walking, not elsewhere classified: Secondary | ICD-10-CM | POA: Insufficient documentation

## 2017-09-19 DIAGNOSIS — M545 Low back pain: Secondary | ICD-10-CM | POA: Insufficient documentation

## 2017-09-19 DIAGNOSIS — R29898 Other symptoms and signs involving the musculoskeletal system: Secondary | ICD-10-CM | POA: Insufficient documentation

## 2017-09-19 DIAGNOSIS — G8929 Other chronic pain: Secondary | ICD-10-CM | POA: Insufficient documentation

## 2017-09-19 DIAGNOSIS — M6281 Muscle weakness (generalized): Secondary | ICD-10-CM | POA: Insufficient documentation

## 2017-09-19 NOTE — Therapy (Signed)
[facility] [place], [place], [address] Phone: [phone]   Fax:  [phone]  Physical Therapy Treatment  Patient Details  Name: Amanda Carlson MRN: [id number] Date of Birth: January 04, 1946 Referring Provider: Mosie Lukes, MD  Encounter Date: 09/19/2017      PT End of Session - 09/19/17 1032    Visit Number 13   Number of Visits 17   Date for PT Re-Evaluation 09/29/17   Authorization Type Medicare Part A and B   Authorization Time Period 08/04/17 to 09/29/17 gcodes done visit 9   Authorization - Visit Number 13   Authorization - Number of Visits 19   PT Start Time 1031   PT Stop Time 1111   PT Time Calculation (min) 40 min   Activity Tolerance Patient tolerated treatment well;Patient limited by fatigue   Behavior During Therapy [facility] for tasks assessed/performed      Past Medical History:  Diagnosis Date  . ALLERGIC RHINITIS, SEASONAL 01/22/2011   Qualifier: Diagnosis of  By: Charlett Blake MD, Erline Levine    . Arthritis   . Breast cancer screening 01/18/2014   Calcified Right side? Follows at [place]   . Bronchitis    hx of  . Cervical cancer screening 04/11/2014  . CTS (carpal tunnel syndrome) 06/07/2012  . Diabetes mellitus type 2 in obese ([place]) 06/29/2015  . Diabetes mellitus without complication ([place])   . Difficulty swallowing    Liquids and foods at times  . Family history of adverse reaction to anesthesia    Pts mother has a difficult time waking up after anesthesia  . GERD 01/22/2011   Qualifier: Diagnosis of  By: Charlett Blake MD, Erline Levine    . HYPOTHYROIDISM 01/22/2011   Qualifier: Diagnosis of  By: Charlett Blake MD, Boyce Medici with Dr Chalmers Cater   . Insomnia 06/07/2012  . Intrinsic asthma, unspecified 01/22/2011   Qualifier: Diagnosis of  By: Charlett Blake MD, Erline Levine    . Irritable bowel syndrome 01/22/2011   Qualifier: Diagnosis of  By: Charlett Blake MD, Erline Levine    . Knee pain, bilateral 10/16/2011  . Knee pain, right 10/16/2011  . Lesion of breast  01/18/2014   Calcified Right side? Follows at [facility]  . Low back pain 10/04/2012  . Medicare annual wellness visit, subsequent 04/14/2014  . Numbness and tingling in hands   . Overweight(278.02) 01/22/2011   Qualifier: Diagnosis of  By: Charlett Blake MD, Erline Levine    . Pulmonary nodules 06/14/2017  . Rheumatic fever    as a child  . Shortness of breath dyspnea    with ambulation  . Sleep apnea    wears CPAP machine  . 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  SCC of tongue.    . THYROID CYST 01/22/2011   Qualifier: Diagnosis of  By: Charlett Blake MD, Erline Levine    . Tinea corporis 06/07/2012  . Tinea corporis 02/22/2016  . Tongue cancer ([place])   . Tongue lesion 01/18/2014    Past Surgical History:  Procedure Laterality Date  . cervical fusion, discectomy, metal plate and 2 screws in place    . CHOLECYSTECTOMY    . EXCISION OF TONGUE LESION Left 07/11/2015   Procedure: EXCISION OF TONGUE LESION;  Surgeon: Melissa Montane, MD;  Location: [facility];  Service: ENT;  Laterality: Left;  . KNEE ARTHROSCOPY     right  . RADICAL NECK DISSECTION Left 07/11/2015   Procedure: RADICAL NECK DISSECTION;  Surgeon: Melissa Montane,  MD;  Location[facility];  Service: ENT;  Laterality: Left;  Left neck dissection and left tongue resection  . THYROIDECTOMY, PARTIAL    . TUBAL LIGATION      There were no vitals filed for this visit.      Subjective Assessment - 09/19/17 1032    Subjective Pt states that she is tired and stiff this morning across her lower back/hips region.   Currently in Pain? Yes   Pain Score 6    Pain Location Hip   Pain Orientation Right;Left   Pain Descriptors / Indicators Tightness;Aching;Dull   Pain Type Chronic pain   Pain Onset More than a month ago   Pain Frequency Constant   Aggravating Factors  getting up and down, walking   Pain Relieving Factors rest, stretches   Effect of Pain on Daily Activities increases               [facility] Adult PT  Treatment/Exercise - 09/19/17 0001      Lumbar Exercises: Stretches   Passive Hamstring Stretch Limitations seated HS stretch 2x30 sec each   Hip Flexor Stretch Limitations   Hip Flexor Stretch Limitations without pillows x15 mins during manual to glutes     Lumbar Exercises: Standing   Lifting Limitations fwd step ups on 4" step x5 reps with BUE support   Shoulder Adduction Limitations gait in // bars with [facility] x3RT   Other Standing Lumbar Exercises 3D hip excursions x10 reps each     Manual Therapy   Manual Therapy Soft tissue mobilization   Manual therapy comments prone no pillows, completed separate rest of treatment   Soft tissue mobilization STM with ball to bil glutes, piriformis; STM with the stick to L adductors in supine               PT Short Term Goals - 09/01/17 1041      PT SHORT TERM GOAL #1   Title Pt will be independent with HEP and consistently perform in order to maximize return to PLOF.   Time 4   Period Weeks   Status Achieved     PT SHORT TERM GOAL #2   Title Pt will have improved knee extension AROM by 10 deg or > to demonstrate improved quad/hip flexor flexibility in order to maximize gait and decrease LBP.   Baseline 9/13: RLE knee ext lacking 8 deg from full extension, LLE lacking 25 deg; hip extension measured in prone: R and L lacking 23 deg from neutral   Time 4   Period Weeks   Status Partially Met     PT SHORT TERM GOAL #3   Title Pt will have improved tolerance to standing and walking to 30 mins or > with 2 or fewer rest breaks required and 3/10 LBP or < to maximize her ability to perform chores for her mother   Baseline 9/13: standing 15-20 mins without having to lean on the counter, pain gets up to about 3-4/10; walking with rollator 30-40 mins comfortably before she has to take a break and/or bend over, pain gets up to about 5-6/10   Time 4   Period Weeks   Status Partially Met           PT Long Term Goals - 09/01/17 1045       PT LONG TERM GOAL #1   Title Pt will have improved MMT by 1 grade or > in all tested muscle groups to demonstrate improved strength, decrease pain, and  maximize gait.   Baseline 9/13: MMT improved by 1/2 grade   Time 8   Period Weeks   Status Partially Met     PT LONG TERM GOAL #2   Title Pt will have improved 5xSTS to 15 sec or < from chair with no UE support to demonstrate decreased pain and improved functional BLE strength.   Baseline 9/13: 28 sec with BUE   Time 8   Period Weeks   Status On-going     PT LONG TERM GOAL #3   Title Pt will have improved TUG with LRAD to 15 sec or < and with min to no gait deviations to demonstrate improved overall balance and function in order to promote participation at home and in the community.   Baseline 9/13: 55 sec with SPC, 24 sec with rollator   Time 8   Period Weeks   Status On-going     PT LONG TERM GOAL #4   Title Pt will be able to perform SLS on BLE for 10 sec or > with no UE support to improve balance and maximize gait.   Baseline 9/13: R: 5 sec no UE, unable on L due to weakness/unsteadiness   Time 8   Period Weeks   Status On-going     PT LONG TERM GOAL #5   Title Pt will have bil knee AROM to [facility] to demonstrate improved overall quad/hip flexor flexibility in order to decrease pain and maximize gait.   Time 8   Period Weeks   Status On-going     PT LONG TERM GOAL #6   Title Pt will have improved lumbar extension AROM to 50% limited or better to decrease pain and demonstrate improved flexibility in order to maximize gait and overall function.   Baseline 9/13: still significantly limited in extension   Time 8   Period Weeks   Status On-going               Plan - 09/19/17 1113    Clinical Impression Statement Pt presented to therapy with increased stiffness in her bil hips this date. Began session with manual to bil glutes with pt in prone position and she was able to tolerate this position for about 15 mins. Pt also  c/o increased groin tightness so PT addressed this with STM with the stick and she reported decreased stiffness following. Pt able to amb with SPC in // bars safely but did require cues for proper [facility] negotiation. Educated pt she could begin trying this at home and she verbalized understanding.    Rehab Potential Fair   PT Frequency 2x / week   PT Duration 8 weeks   PT Treatment/Interventions ADLs/Self Care Home Management;Cryotherapy;Electrical Stimulation;Moist Heat;DME Instruction;Gait training;Stair training;Functional mobility training;Therapeutic activities;Therapeutic exercise;Balance training;Neuromuscular re-education;Patient/family education;Manual techniques;Passive range of motion;Dry needling;Energy conservation;Taping   PT Next Visit Plan Continue to progress improving hip mobility and LE strength. Continue to progress postural strengthening.  continue gait with SPC and trial outside of // bars; manual PRN   PT Home Exercise Plan eval: supine hip flexor stretch  8/22:  3D hip excursions, supine bridge, pelvic tilts, clams; 8/29: prone quad stretch with rope and pillows under hips; 9/13: supine frog stretch; 9/26: prone hip ext   Consulted and Agree with Plan of Care Patient      Patient will benefit from skilled therapeutic intervention in order to improve the following deficits and impairments:  Abnormal gait, Decreased activity tolerance, Decreased balance, Decreased endurance, Decreased mobility, Decreased  range of motion, Decreased strength, Difficulty walking, Hypomobility, Increased muscle spasms, Impaired flexibility, Improper body mechanics, Postural dysfunction, Pain  Visit Diagnosis: Chronic bilateral low back pain without sciatica  Difficulty in walking, not elsewhere classified  Muscle weakness (generalized)  Abnormal posture  Other symptoms and signs involving the musculoskeletal system     Problem List Patient Active Problem List   Diagnosis Date Noted  .  Encephalopathy 07/20/2017  . Pulmonary nodules 06/14/2017  . Carotid artery disease ([place]) 06/13/2016  . Flank pain 02/22/2016  . Cancer of border of tongue ([place]) 07/11/2015  . Diabetes mellitus type 2 in obese ([place]) 06/29/2015  . Radiculopathy of leg 11/18/2014  . Medicare annual wellness visit, subsequent 04/14/2014  . Urine frequency 04/14/2014  . Cervical cancer screening 04/11/2014  . Obesity 01/20/2014  . 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 Breast cancer screening 01/18/2014  . Low back pain 10/04/2012  . CTS (carpal tunnel syndrome) 06/07/2012  . Tinea corporis 06/07/2012  . Insomnia 06/07/2012  . Knee pain, bilateral 10/16/2011  . Hypothyroidism 01/22/2011  . HTN (hypertension) 01/22/2011  . SLEEP APNEA, OBSTRUCTIVE 01/22/2011  . INTRINSIC ASTHMA, UNSPECIFIED 01/22/2011  . ARTHRITIS, CHRONIC 01/22/2011  . RHEUMATIC FEVER, HX OF 01/22/2011  . CHICKENPOX, HX OF 01/22/2011  . 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 THYROID CYST 01/22/2011  . Hyperlipidemia, mixed 01/22/2011  . ALLERGIC RHINITIS, SEASONAL 01/22/2011  . GERD 01/22/2011  . IRRITABLE BOWEL SYNDROME 01/22/2011  . Disorder of bone and cartilage 01/22/2011  . CARDIAC MURMUR, HX OF 01/22/2011       Geraldine Solar PT, DPT  [facility] [address] [place], [place], [address] Phone: [phone]   Fax:  [phone]  Name: Amanda Carlson MRN: [id number] Date of Birth: 01/23/1946

## 2017-09-22 ENCOUNTER — Ambulatory Visit (HOSPITAL_COMMUNITY): Payer: Medicare Other

## 2017-09-22 ENCOUNTER — Encounter (HOSPITAL_COMMUNITY): Payer: Self-pay

## 2017-09-22 DIAGNOSIS — R262 Difficulty in walking, not elsewhere classified: Secondary | ICD-10-CM

## 2017-09-22 DIAGNOSIS — R29898 Other symptoms and signs involving the musculoskeletal system: Secondary | ICD-10-CM

## 2017-09-22 DIAGNOSIS — M545 Low back pain: Secondary | ICD-10-CM | POA: Diagnosis not present

## 2017-09-22 DIAGNOSIS — M6281 Muscle weakness (generalized): Secondary | ICD-10-CM | POA: Diagnosis not present

## 2017-09-22 DIAGNOSIS — R293 Abnormal posture: Secondary | ICD-10-CM | POA: Diagnosis not present

## 2017-09-22 DIAGNOSIS — G8929 Other chronic pain: Secondary | ICD-10-CM

## 2017-09-22 NOTE — Therapy (Signed)
[place], [place], [address] Phone: [phone]   Fax:  [phone]  Physical Therapy Treatment  Patient Details  Name: Amanda Carlson MRN: [id number] Date of Birth: 1946-04-20 Referring Provider: Mosie Lukes, MD  Encounter Date: 09/22/2017      PT End of Session - 09/22/17 1116    Visit Number 14   Number of Visits 17   Date for PT Re-Evaluation 09/29/17   Authorization Type Medicare Part A and B   Authorization Time Period 08/04/17 to 09/29/17 gcodes done visit 9   Authorization - Visit Number 14   Authorization - Number of Visits 19   PT Start Time 1116   PT Stop Time 1158   PT Time Calculation (min) 42 min   Activity Tolerance Patient tolerated treatment well;Patient limited by fatigue   Behavior During Therapy [facility] for tasks assessed/performed      Past Medical History:  Diagnosis Date  . ALLERGIC RHINITIS, SEASONAL 01/22/2011   Qualifier: Diagnosis of  By: Charlett Blake MD, Erline Levine    . Arthritis   . Breast cancer screening 01/18/2014   Calcified Right side? Follows at [facility]   . Bronchitis    hx of  . Cervical cancer screening 04/11/2014  . CTS (carpal tunnel syndrome) 06/07/2012  . Diabetes mellitus type 2 in obese ([place]) 06/29/2015  . Diabetes mellitus without complication ([place])   . Difficulty swallowing    Liquids and foods at times  . Family history of adverse reaction to anesthesia    Pts mother has a difficult time waking up after anesthesia  . GERD 01/22/2011   Qualifier: Diagnosis of  By: Charlett Blake MD, Erline Levine    . HYPOTHYROIDISM 01/22/2011   Qualifier: Diagnosis of  By: Charlett Blake MD, Boyce Medici with Dr Chalmers Cater   . Insomnia 06/07/2012  . Intrinsic asthma, unspecified 01/22/2011   Qualifier: Diagnosis of  By: Charlett Blake MD, Erline Levine    . Irritable bowel syndrome 01/22/2011   Qualifier: Diagnosis of  By: Charlett Blake MD, Erline Levine    . Knee pain, bilateral 10/16/2011  . Knee pain, right 10/16/2011  . Lesion of breast  01/18/2014   Calcified Right side? Follows at [facility]  . Low back pain 10/04/2012  . Medicare annual wellness visit, subsequent 04/14/2014  . Numbness and tingling in hands   . Overweight(278.02) 01/22/2011   Qualifier: Diagnosis of  By: Charlett Blake MD, Erline Levine    . Pulmonary nodules 06/14/2017  . Rheumatic fever    as a child  . Shortness of breath dyspnea    with ambulation  . Sleep apnea    wears CPAP machine  . 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  SCC of tongue.    . THYROID CYST 01/22/2011   Qualifier: Diagnosis of  By: Charlett Blake MD, Erline Levine    . Tinea corporis 06/07/2012  . Tinea corporis 02/22/2016  . Tongue cancer ([place])   . Tongue lesion 01/18/2014    Past Surgical History:  Procedure Laterality Date  . cervical fusion, discectomy, metal plate and 2 screws in place    . CHOLECYSTECTOMY    . EXCISION OF TONGUE LESION Left 07/11/2015   Procedure: EXCISION OF TONGUE LESION;  Surgeon: Melissa Montane, MD;  Location: [facility];  Service: ENT;  Laterality: Left;  . KNEE ARTHROSCOPY     right  . RADICAL NECK DISSECTION Left 07/11/2015   Procedure: RADICAL NECK DISSECTION;  Surgeon: Melissa Montane,  MD;  Location[facility];  Service: ENT;  Laterality: Left;  Left neck dissection and left tongue resection  . THYROIDECTOMY, PARTIAL    . TUBAL LIGATION      There were no vitals filed for this visit.      Subjective Assessment - 09/22/17 1117    Subjective Pt reports that she is having increased stiffness this morning and she is not sure what she did. She states she is hurting up into her back today.   Currently in Pain? Yes   Pain Score 7    Pain Location Hip   Pain Orientation Right;Left  and lower back   Pain Descriptors / Indicators Aching;Tightness   Pain Onset More than a month ago   Pain Frequency Constant   Aggravating Factors  getting up and down, walking   Pain Relieving Factors rest, stretches   Effect of Pain on Daily Activities increases               [facility] Adult PT Treatment/Exercise - 09/22/17 0001      Lumbar Exercises: Stretches   Active Hamstring Stretch 10 seconds   Active Hamstring Stretch Limitations x10 reps, hands behind knee; BLE   ITB Stretch Limitations seated lumbar flexion stretch (hands to floor) 3x30 sec     Lumbar Exercises: Standing   Shoulder Adduction Limitations gait with SPC x67f (min-mod LOB during)     Lumbar Exercises: Seated   Other Seated Lumbar Exercises 3D thoracic excursions x10 reps each     Manual Therapy   Manual Therapy Soft tissue mobilization   Manual therapy comments prone no pillows, completed separate rest of treatment              PT Education - 09/22/17 1122    Education provided Yes   Education Details gait with SPC, added new stretches to HEP   Person(s) Educated Patient   Methods Explanation;Demonstration;Handout   Comprehension Verbalized understanding;Returned demonstration          PT Short Term Goals - 09/01/17 1041      PT SHORT TERM GOAL #1   Title Pt will be independent with HEP and consistently perform in order to maximize return to PLOF.   Time 4   Period Weeks   Status Achieved     PT SHORT TERM GOAL #2   Title Pt will have improved knee extension AROM by 10 deg or > to demonstrate improved quad/hip flexor flexibility in order to maximize gait and decrease LBP.   Baseline 9/13: RLE knee ext lacking 8 deg from full extension, LLE lacking 25 deg; hip extension measured in prone: R and L lacking 23 deg from neutral   Time 4   Period Weeks   Status Partially Met     PT SHORT TERM GOAL #3   Title Pt will have improved tolerance to standing and walking to 30 mins or > with 2 or fewer rest breaks required and 3/10 LBP or < to maximize her ability to perform chores for her mother   Baseline 9/13: standing 15-20 mins without having to lean on the counter, pain gets up to about 3-4/10; walking with rollator 30-40 mins comfortably before she has to  take a break and/or bend over, pain gets up to about 5-6/10   Time 4   Period Weeks   Status Partially Met           PT Long Term Goals - 09/01/17 1045      PT LONG  TERM GOAL #1   Title Pt will have improved MMT by 1 grade or > in all tested muscle groups to demonstrate improved strength, decrease pain, and maximize gait.   Baseline 9/13: MMT improved by 1/2 grade   Time 8   Period Weeks   Status Partially Met     PT LONG TERM GOAL #2   Title Pt will have improved 5xSTS to 15 sec or < from chair with no UE support to demonstrate decreased pain and improved functional BLE strength.   Baseline 9/13: 28 sec with BUE   Time 8   Period Weeks   Status On-going     PT LONG TERM GOAL #3   Title Pt will have improved TUG with LRAD to 15 sec or < and with min to no gait deviations to demonstrate improved overall balance and function in order to promote participation at home and in the community.   Baseline 9/13: 55 sec with SPC, 24 sec with rollator   Time 8   Period Weeks   Status On-going     PT LONG TERM GOAL #4   Title Pt will be able to perform SLS on BLE for 10 sec or > with no UE support to improve balance and maximize gait.   Baseline 9/13: R: 5 sec no UE, unable on L due to weakness/unsteadiness   Time 8   Period Weeks   Status On-going     PT LONG TERM GOAL #5   Title Pt will have bil knee AROM to [facility] to demonstrate improved overall quad/hip flexor flexibility in order to decrease pain and maximize gait.   Time 8   Period Weeks   Status On-going     PT LONG TERM GOAL #6   Title Pt will have improved lumbar extension AROM to 50% limited or better to decrease pain and demonstrate improved flexibility in order to maximize gait and overall function.   Baseline 9/13: still significantly limited in extension   Time 8   Period Weeks   Status On-going               Plan - 09/22/17 1202    Clinical Impression Statement Pt continues to present to therapy with  increased stiffness. Addressed this with instrument assisted STM to bil glutes, piriformis, L groin and lumbar paraspinals with good response to it. Rest of session focused on improving overall spinal mobility and gait with SPC. She had min-mod LOB during amb with SPC which required mod A and use of UE on counter to stabilize. She reported that she just got fatigued. Overall, pt does report that her function is improving as tasks at home are becoming easier for her to complete. Pt reported decreased tightness/stiffness at EOS.    Rehab Potential Fair   PT Frequency 2x / week   PT Duration 8 weeks   PT Treatment/Interventions ADLs/Self Care Home Management;Cryotherapy;Electrical Stimulation;Moist Heat;DME Instruction;Gait training;Stair training;Functional mobility training;Therapeutic activities;Therapeutic exercise;Balance training;Neuromuscular re-education;Patient/family education;Manual techniques;Passive range of motion;Dry needling;Energy conservation;Taping   PT Next Visit Plan Continue to progress improving hip mobility and LE strength. Continue to progress postural strengthening.  continue gait with SPC outside of // bars; manual PRN   PT Home Exercise Plan eval: supine hip flexor stretch  8/22:  3D hip excursions, supine bridge, pelvic tilts, clams; 8/29: prone quad stretch with rope and pillows under hips; 9/13: supine frog stretch; 9/26: prone hip ext; 10/4: modified HS stretch to 90/90, seated lumbar flexion stretch  Consulted and Agree with Plan of Care Patient      Patient will benefit from skilled therapeutic intervention in order to improve the following deficits and impairments:  Abnormal gait, Decreased activity tolerance, Decreased balance, Decreased endurance, Decreased mobility, Decreased range of motion, Decreased strength, Difficulty walking, Hypomobility, Increased muscle spasms, Impaired flexibility, Improper body mechanics, Postural dysfunction, Pain  Visit  Diagnosis: Chronic bilateral low back pain without sciatica  Difficulty in walking, not elsewhere classified  Muscle weakness (generalized)  Abnormal posture  Other symptoms and signs involving the musculoskeletal system     Problem List Patient Active Problem List   Diagnosis Date Noted  . Encephalopathy 07/20/2017  . Pulmonary nodules 06/14/2017  . Carotid artery disease ([place]) 06/13/2016  . Flank pain 02/22/2016  . Cancer of border of tongue ([place]) 07/11/2015  . Diabetes mellitus type 2 in obese ([place]) 06/29/2015  . Radiculopathy of leg 11/18/2014  . Medicare annual wellness visit, subsequent 04/14/2014  . Urine frequency 04/14/2014  . Cervical cancer screening 04/11/2014  . Obesity 01/20/2014  . 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 Breast cancer screening 01/18/2014  . Low back pain 10/04/2012  . CTS (carpal tunnel syndrome) 06/07/2012  . Tinea corporis 06/07/2012  . Insomnia 06/07/2012  . Knee pain, bilateral 10/16/2011  . Hypothyroidism 01/22/2011  . HTN (hypertension) 01/22/2011  . SLEEP APNEA, OBSTRUCTIVE 01/22/2011  . INTRINSIC ASTHMA, UNSPECIFIED 01/22/2011  . ARTHRITIS, CHRONIC 01/22/2011  . RHEUMATIC FEVER, HX OF 01/22/2011  . CHICKENPOX, HX OF 01/22/2011  . 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 THYROID CYST 01/22/2011  . Hyperlipidemia, mixed 01/22/2011  . ALLERGIC RHINITIS, SEASONAL 01/22/2011  . GERD 01/22/2011  . IRRITABLE BOWEL SYNDROME 01/22/2011  . Disorder of bone and cartilage 01/22/2011  . CARDIAC MURMUR, HX OF 01/22/2011    Geraldine Solar PT, DPT  [facility] [address] [place], [place], [address] Phone: [phone]   Fax:  [phone]  Name: MARDELLA NUCKLES MRN: [id number] Date of Birth: 10-27-1946

## 2017-09-22 NOTE — Patient Instructions (Signed)
  HAMSTRING STRETCH - SUPINE  While lying on your back, hold the back of your knee and raise up your leg until a stretch is felt.  Hold 10 seconds, 10-15 times on each side   Seated Lumbar Flexion Stretch  Start sitting on a chair with good posture, reach arms towards the floor until you feel a stretch in your lower back and hold.  Perform 1-2x/day, 3-5 stretches holding for 30 seconds

## 2017-09-26 ENCOUNTER — Ambulatory Visit (HOSPITAL_COMMUNITY): Payer: Medicare Other

## 2017-09-28 ENCOUNTER — Telehealth (HOSPITAL_COMMUNITY): Payer: Self-pay

## 2017-09-28 NOTE — Telephone Encounter (Signed)
PT returned pt's phone call regarding her appointment scheduled for tomorrow. Pt had called earlier in the week and cancelled her Tuesday appointment due to sliding off of her bed and hurting her R knee. Pt called again this evening to discuss cancelling her appointment for tomorrow. Pt stated she wanted to be at her best for her reassessment that is due at her next scheduled visit and considered cancelling the 10/11 appointment and coming on 10/15 for her reassessment to give her knee more time to feel better. However, this PT will not be here on Monday 10/15 and the pt wanted to stay with this PT for the reassessment. PT explained that if she can make it in tomorrow, she will be reassessed but if she can't get out due to her knee or the weather, then she will be reassessed Monday, 10/15 and she verbalized understanding. PT asked pt to call tomorrow and let the office know if she can't make it.   Geraldine Solar PT, DPT

## 2017-09-29 ENCOUNTER — Encounter (HOSPITAL_COMMUNITY): Payer: Self-pay

## 2017-09-29 ENCOUNTER — Ambulatory Visit (HOSPITAL_COMMUNITY): Payer: Medicare Other

## 2017-09-29 DIAGNOSIS — R293 Abnormal posture: Secondary | ICD-10-CM | POA: Diagnosis not present

## 2017-09-29 DIAGNOSIS — R29898 Other symptoms and signs involving the musculoskeletal system: Secondary | ICD-10-CM

## 2017-09-29 DIAGNOSIS — G8929 Other chronic pain: Secondary | ICD-10-CM | POA: Diagnosis not present

## 2017-09-29 DIAGNOSIS — M545 Low back pain, unspecified: Secondary | ICD-10-CM

## 2017-09-29 DIAGNOSIS — R262 Difficulty in walking, not elsewhere classified: Secondary | ICD-10-CM

## 2017-09-29 DIAGNOSIS — M6281 Muscle weakness (generalized): Secondary | ICD-10-CM | POA: Diagnosis not present

## 2017-09-29 NOTE — Patient Instructions (Signed)
  Seated Lumbar Flexion Stretch  Start sitting on a chair with good posture, reach arms towards the floor until you feel a stretch in your lower back and hold.  Perform 1-2x/day to stretch your lower back, 3-5 stretches holding for 30-60 seconds

## 2017-09-29 NOTE — Therapy (Signed)
[place], [place], [address] Phone: [phone]   Fax:  [phone]  Physical Therapy Treatment/Reassessment  Patient Details  Name: Amanda Carlson MRN: [id number] Date of Birth: 12/24/1945 Referring Provider: Mosie Lukes, MD  Encounter Date: 09/29/2017      PT End of Session - 09/29/17 1119    Visit Number 15   Number of Visits 25   Date for PT Re-Evaluation 10/27/17   Authorization Type Medicare Part A and B (gcodes done visit 15)   Authorization Time Period 08/04/17 to 09/29/17; NEW: 09/29/17 to 10/27/17   Authorization - Visit Number 15   Authorization - Number of Visits 25   PT Start Time 1120   PT Stop Time 1203   PT Time Calculation (min) 43 min   Activity Tolerance Patient tolerated treatment well;Patient limited by fatigue   Behavior During Therapy [facility] for tasks assessed/performed      Past Medical History:  Diagnosis Date  . ALLERGIC RHINITIS, SEASONAL 01/22/2011   Qualifier: Diagnosis of  By: Charlett Blake MD, Erline Levine    . Arthritis   . Breast cancer screening 01/18/2014   Calcified Right side? Follows at [facility]   . Bronchitis    hx of  . Cervical cancer screening 04/11/2014  . CTS (carpal tunnel syndrome) 06/07/2012  . Diabetes mellitus type 2 in obese ([place]) 06/29/2015  . Diabetes mellitus without complication ([place])   . Difficulty swallowing    Liquids and foods at times  . Family history of adverse reaction to anesthesia    Pts mother has a difficult time waking up after anesthesia  . GERD 01/22/2011   Qualifier: Diagnosis of  By: Charlett Blake MD, Erline Levine    . HYPOTHYROIDISM 01/22/2011   Qualifier: Diagnosis of  By: Charlett Blake MD, Boyce Medici with Dr Chalmers Cater   . Insomnia 06/07/2012  . Intrinsic asthma, unspecified 01/22/2011   Qualifier: Diagnosis of  By: Charlett Blake MD, Erline Levine    . Irritable bowel syndrome 01/22/2011   Qualifier: Diagnosis of  By: Charlett Blake MD, Erline Levine    . Knee pain, bilateral 10/16/2011  . Knee pain,  right 10/16/2011  . Lesion of breast 01/18/2014   Calcified Right side? Follows at [facility]  . Low back pain 10/04/2012  . Medicare annual wellness visit, subsequent 04/14/2014  . Numbness and tingling in hands   . Overweight(278.02) 01/22/2011   Qualifier: Diagnosis of  By: Charlett Blake MD, Erline Levine    . Pulmonary nodules 06/14/2017  . Rheumatic fever    as a child  . Shortness of breath dyspnea    with ambulation  . Sleep apnea    wears CPAP machine  . 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  SCC of tongue.    . THYROID CYST 01/22/2011   Qualifier: Diagnosis of  By: Charlett Blake MD, Erline Levine    . Tinea corporis 06/07/2012  . Tinea corporis 02/22/2016  . Tongue cancer ([place])   . Tongue lesion 01/18/2014    Past Surgical History:  Procedure Laterality Date  . cervical fusion, discectomy, metal plate and 2 screws in place    . CHOLECYSTECTOMY    . EXCISION OF TONGUE LESION Left 07/11/2015   Procedure: EXCISION OF TONGUE LESION;  Surgeon: Melissa Montane, MD;  Location: [facility];  Service: ENT;  Laterality: Left;  . KNEE ARTHROSCOPY     right  . RADICAL NECK DISSECTION Left 07/11/2015   Procedure: RADICAL NECK DISSECTION;  Surgeon: Melissa Montane, MD;  Location: [facility] OR;  Service: ENT;  Laterality: Left;  Left neck dissection and left tongue resection  . THYROIDECTOMY, PARTIAL    . TUBAL LIGATION      There were no vitals filed for this visit.      Subjective Assessment - 09/29/17 1119    Subjective Pt states that she fell off of the edge of her bed and she landed straight on her R knee. She states that she initially couldn't put any weight on the RLE but it is doing much better after a few days of rest.   Currently in Pain? Yes   Pain Score 5    Pain Location Hip   Pain Orientation Right;Left   Pain Descriptors / Indicators Aching;Tightness   Pain Type Chronic pain   Pain Onset More than a month ago   Pain Frequency Constant   Aggravating Factors  getting up and  down, walking   Pain Relieving Factors rest, stretches   Effect of Pain on Daily Activities increases               [facility] PT Assessment - 09/29/17 0001      Assessment   Medical Diagnosis LBP   Referring Provider Mosie Lukes, MD     AROM   Right Hip Extension --  -15 AROM and at rest   Left Hip Extension -11  AROM; at rest -15 deg from neutral   Right Knee Extension -10   Left Knee Extension -17   Lumbar Extension slight improvement but still significantly limited     Strength   Right Hip Extension 4/5  was 4-   Right Hip ABduction 4+/5  was 4   Left Hip Flexion 4+/5  was 4+   Left Hip Extension 4/5  was 4-   Left Hip ABduction 4/5  was 4-   Right Knee Flexion 5/5  was 4+   Right Knee Extension 5/5  was 4+   Left Knee Flexion 5/5  was 4+   Left Knee Extension 5/5  was 4+     Balance   Balance Assessed Yes     Static Standing Balance   Static Standing - Balance Support Right upper extremity supported   Static Standing Balance -  Activities  Single Leg Stance - Right Leg;Single Leg Stance - Left Leg   Static Standing - Comment/# of Minutes R: 10 sec L: 10 sec     Standardized Balance Assessment   Standardized Balance Assessment Five Times Sit to Stand;Timed Up and Go Test   Five times sit to stand comments  28 sec from chair, BUE support      Timed Up and Go Test   TUG Normal TUG   Normal TUG (seconds) 26  rollator; 51 sec with [facility]               PT Education - 09/29/17 1205    Education provided Yes   Education Details reassessment findings, POC, HEP   Person(s) Educated Patient   Methods Explanation;Demonstration;Handout   Comprehension Verbalized understanding;Returned demonstration          PT Short Term Goals - 09/29/17 1120      PT SHORT TERM GOAL #1   Title Pt will be independent with HEP and consistently perform in order to maximize return to PLOF.   Time 4   Period Weeks   Status Achieved     PT SHORT TERM GOAL #2    Title  Pt will have improved knee extension AROM by 10 deg or > to demonstrate improved quad/hip flexor flexibility in order to maximize gait and decrease LBP.   Baseline 10/11: RLE knee ext lacking 8 deg from full extension, LLE lacking 17 deg; hip extension measured in prone: R and L lacking 15 deg from neutral at rest   Time 4   Period Weeks   Status Partially Met     PT SHORT TERM GOAL #3   Title Pt will have improved tolerance to standing and walking to 30 mins or > with 2 or fewer rest breaks required and 3/10 LBP or < to maximize her ability to perform chores for her mother   Baseline 9/13: standing and walking 45 mins without having to lean on the counter, pain averages about a 3/10 with standing a walking   Time 4   Period Weeks   Status Achieved           PT Long Term Goals - 09/29/17 1120      PT LONG TERM GOAL #1   Title Pt will have improved MMT by 1 grade or > in all tested muscle groups to demonstrate improved strength, decrease pain, and maximize gait.   Baseline 10/11: MMT improved by 1/2 grade from inital eval   Time 8   Period Weeks   Status Partially Met     PT LONG TERM GOAL #2   Title Pt will have improved 5xSTS to 15 sec or < from chair with no UE support to demonstrate decreased pain and improved functional BLE strength.   Baseline 9/13: 28 sec with BUE   Time 8   Period Weeks   Status On-going     PT LONG TERM GOAL #3   Title Pt will have improved TUG with LRAD to 15 sec or < and with min to no gait deviations to demonstrate improved overall balance and function in order to promote participation at home and in the community.   Baseline 10/11: 51 sec with SPC, 26 sec with rollator   Time 8   Period Weeks   Status On-going     PT LONG TERM GOAL #4   Title Pt will be able to perform SLS on BLE for 10 sec or > with no UE support to improve balance and maximize gait.   Baseline 10/11: able to balance SLS on BLE for 10 sec with 1 UE    Time 8   Period  Weeks   Status Partially Met     PT LONG TERM GOAL #5   Title Pt will have bil knee AROM to [facility] to demonstrate improved overall quad/hip flexor flexibility in order to decrease pain and maximize gait.   Time 8   Period Weeks   Status On-going     PT LONG TERM GOAL #6   Title Pt will have improved lumbar extension AROM to 50% limited or better to decrease pain and demonstrate improved flexibility in order to maximize gait and overall function.   Baseline 9/13: still significantly limited in extension   Time 8   Period Weeks   Status On-going               Plan - 09/29/17 1214    Clinical Impression Statement PT reassessed pt's goals and outcome measures this date. Pt has made good progress towards goals as illustrated above. Pt feels 50% improved since starting therapy reporting that getting up and down is easier and she doesn't  feel as locked up in her hips; she states that her main limiting factors are the increased time it takes to get up and down and her walking. Pt has made some progress towards her goals as evidenced above. Her hip flexor tightness has significantly improved on the LLE as she was lacking 23 deg hip extension and 42 deg knee extension at rest and today she was lacking 15 deg of hip extension and 17 deg of knee extension on the L at rest. Pt's functional strength continues to be limited as evidenced by her 5xSTS and TUG times. Pt able to perform SLS for 10 sec with 1 UE support with ease, but without UE support she is still limited. PT noticed that during ambulation, pt is actually limited in lumbar flexion and in excessive lumbar extension, likely due to compensation for anteriorly rotated pelvis. PT provided pt with lumbar flexion stretch to improve her flexion ROM/mobility. Pt would benefit from brief extension of PT services to address her remaining deficits in order to maximize her overall function and improve her functional mobility.   Rehab Potential Fair   PT  Frequency 2x / week   PT Duration 8 weeks   PT Treatment/Interventions ADLs/Self Care Home Management;Cryotherapy;Electrical Stimulation;Moist Heat;DME Instruction;Gait training;Stair training;Functional mobility training;Therapeutic activities;Therapeutic exercise;Balance training;Neuromuscular re-education;Patient/family education;Manual techniques;Passive range of motion;Dry needling;Energy conservation;Taping   PT Next Visit Plan manual PRN for bil hips; add SKTC, DKTC, piriformis stretch to address lumbar limitations in flexion; posterior pelvic tilts in standing/against wall, trial RFIS, manual hip distraction, continue functional BLE strengthening, gait and balance training   PT Home Exercise Plan eval: supine hip flexor stretch  8/22:  3D hip excursions, supine bridge, pelvic tilts, clams; 8/29: prone quad stretch with rope and pillows under hips; 9/13: supine frog stretch; 9/26: prone hip ext; 10/4: modified HS stretch to 90/90 10/11: seated lumbar flexion stretch   Consulted and Agree with Plan of Care Patient      Patient will benefit from skilled therapeutic intervention in order to improve the following deficits and impairments:  Abnormal gait, Decreased activity tolerance, Decreased balance, Decreased endurance, Decreased mobility, Decreased range of motion, Decreased strength, Difficulty walking, Hypomobility, Increased muscle spasms, Impaired flexibility, Improper body mechanics, Postural dysfunction, Pain  Visit Diagnosis: Chronic bilateral low back pain without sciatica - Plan: PT plan of care cert/re-cert  Difficulty in walking, not elsewhere classified - Plan: PT plan of care cert/re-cert  Muscle weakness (generalized) - Plan: PT plan of care cert/re-cert  Abnormal posture - Plan: PT plan of care cert/re-cert  Other symptoms and signs involving the musculoskeletal system - Plan: PT plan of care cert/re-cert        G-Codes - Oct 29, 2017 March 26, 1217    Functional Assessment Tool  Used (Outpatient Only) clinical judgement, 5xSTS, TUG, MMT, AROM, flexbility   Functional Limitation Mobility: Walking and moving around   Mobility: Walking and Moving Around Current Status (W0981) At least 40 percent but less than 60 percent impaired, limited or restricted   Mobility: Walking and Moving Around Goal Status (X9147) At least 20 percent but less than 40 percent impaired, limited or restricted       Problem List Patient Active Problem List   Diagnosis Date Noted  . Encephalopathy 07/20/2017  . Pulmonary nodules 06/14/2017  . Carotid artery disease ([place]) 06/13/2016  . Flank pain 02/22/2016  . Cancer of border of tongue ([place]) 07/11/2015  . Diabetes mellitus type 2 in obese ([place]) 06/29/2015  . Radiculopathy of leg 11/18/2014  .  Medicare annual wellness visit, subsequent 04/14/2014  . Urine frequency 04/14/2014  . Cervical cancer screening 04/11/2014  . Obesity 01/20/2014  . 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 Breast cancer screening 01/18/2014  . Low back pain 10/04/2012  . CTS (carpal tunnel syndrome) 06/07/2012  . Tinea corporis 06/07/2012  . Insomnia 06/07/2012  . Knee pain, bilateral 10/16/2011  . Hypothyroidism 01/22/2011  . HTN (hypertension) 01/22/2011  . SLEEP APNEA, OBSTRUCTIVE 01/22/2011  . INTRINSIC ASTHMA, UNSPECIFIED 01/22/2011  . ARTHRITIS, CHRONIC 01/22/2011  . RHEUMATIC FEVER, HX OF 01/22/2011  . CHICKENPOX, HX OF 01/22/2011  . 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 THYROID CYST 01/22/2011  . Hyperlipidemia, mixed 01/22/2011  . ALLERGIC RHINITIS, SEASONAL 01/22/2011  . GERD 01/22/2011  . IRRITABLE BOWEL SYNDROME 01/22/2011  . Disorder of bone and cartilage 01/22/2011  . CARDIAC MURMUR, HX OF 01/22/2011        Geraldine Solar PT, DPT  [facility] [address] [place], [place], [address] Phone: [phone]   Fax:  [phone]  Name: ZHURI KRASS MRN: [id number] Date of  Birth: Feb 03, 1946

## 2017-10-03 ENCOUNTER — Ambulatory Visit (HOSPITAL_COMMUNITY): Payer: Medicare Other | Admitting: Physical Therapy

## 2017-10-03 DIAGNOSIS — R293 Abnormal posture: Secondary | ICD-10-CM | POA: Diagnosis not present

## 2017-10-03 DIAGNOSIS — R29898 Other symptoms and signs involving the musculoskeletal system: Secondary | ICD-10-CM

## 2017-10-03 DIAGNOSIS — R262 Difficulty in walking, not elsewhere classified: Secondary | ICD-10-CM

## 2017-10-03 DIAGNOSIS — M6281 Muscle weakness (generalized): Secondary | ICD-10-CM | POA: Diagnosis not present

## 2017-10-03 DIAGNOSIS — M545 Low back pain: Principal | ICD-10-CM

## 2017-10-03 DIAGNOSIS — G8929 Other chronic pain: Secondary | ICD-10-CM
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
## 2017-10-05 ENCOUNTER — Ambulatory Visit (HOSPITAL_COMMUNITY): Payer: Medicare Other

## 2017-10-05 ENCOUNTER — Encounter (HOSPITAL_COMMUNITY): Payer: Self-pay

## 2017-10-05 DIAGNOSIS — R293 Abnormal posture: Secondary | ICD-10-CM

## 2017-10-05 DIAGNOSIS — R29898 Other symptoms and signs involving the musculoskeletal system: Secondary | ICD-10-CM | POA: Diagnosis not present

## 2017-10-05 DIAGNOSIS — G8929 Other chronic pain: Secondary | ICD-10-CM | POA: Diagnosis not present

## 2017-10-05 DIAGNOSIS — M545 Low back pain: Principal | ICD-10-CM

## 2017-10-05 DIAGNOSIS — M6281 Muscle weakness (generalized): Secondary | ICD-10-CM | POA: Diagnosis not present

## 2017-10-05 DIAGNOSIS — R262 Difficulty in walking, not elsewhere classified: Secondary | ICD-10-CM | POA: Diagnosis not present

## 2017-10-05 NOTE — Therapy (Signed)
[place], [place], [address] Phone: [phone]   Fax:  [phone]  Physical Therapy Treatment  Patient Details  Name: Amanda Carlson MRN: [id number] Date of Birth: 1946/05/14 Referring Provider: Mosie Lukes, MD  Encounter Date: 10/05/2017      PT End of Session - 10/05/17 1123    Visit Number 17   Number of Visits 25   Date for PT Re-Evaluation 10/27/17   Authorization Type Medicare Part A and B (gcodes done visit 15)   Authorization Time Period 08/04/17 to 09/29/17; NEW: 09/29/17 to 10/27/17   Authorization - Visit Number 74   Authorization - Number of Visits 25   PT Start Time 1120   PT Stop Time 1200   PT Time Calculation (min) 40 min   Equipment Utilized During Treatment Cervical collar   Activity Tolerance Patient tolerated treatment well;Patient limited by fatigue   Behavior During Therapy [facility] for tasks assessed/performed      Past Medical History:  Diagnosis Date  . ALLERGIC RHINITIS, SEASONAL 01/22/2011   Qualifier: Diagnosis of  By: Charlett Blake MD, Erline Levine    . Arthritis   . Breast cancer screening 01/18/2014   Calcified Right side? Follows at [facility]   . Bronchitis    hx of  . Cervical cancer screening 04/11/2014  . CTS (carpal tunnel syndrome) 06/07/2012  . Diabetes mellitus type 2 in obese ([place]) 06/29/2015  . Diabetes mellitus without complication ([place])   . Difficulty swallowing    Liquids and foods at times  . Family history of adverse reaction to anesthesia    Pts mother has a difficult time waking up after anesthesia  . GERD 01/22/2011   Qualifier: Diagnosis of  By: Charlett Blake MD, Erline Levine    . HYPOTHYROIDISM 01/22/2011   Qualifier: Diagnosis of  By: Charlett Blake MD, Boyce Medici with Dr Chalmers Cater   . Insomnia 06/07/2012  . Intrinsic asthma, unspecified 01/22/2011   Qualifier: Diagnosis of  By: Charlett Blake MD, Erline Levine    . Irritable bowel syndrome 01/22/2011   Qualifier: Diagnosis of  By: Charlett Blake MD, Erline Levine    . Knee  pain, bilateral 10/16/2011  . Knee pain, right 10/16/2011  . Lesion of breast 01/18/2014   Calcified Right side? Follows at [facility]  . Low back pain 10/04/2012  . Medicare annual wellness visit, subsequent 04/14/2014  . Numbness and tingling in hands   . Overweight(278.02) 01/22/2011   Qualifier: Diagnosis of  By: Charlett Blake MD, Erline Levine    . Pulmonary nodules 06/14/2017  . Rheumatic fever    as a child  . Shortness of breath dyspnea    with ambulation  . Sleep apnea    wears CPAP machine  . 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  SCC of tongue.    . THYROID CYST 01/22/2011   Qualifier: Diagnosis of  By: Charlett Blake MD, Erline Levine    . Tinea corporis 06/07/2012  . Tinea corporis 02/22/2016  . Tongue cancer ([place])   . Tongue lesion 01/18/2014    Past Surgical History:  Procedure Laterality Date  . cervical fusion, discectomy, metal plate and 2 screws in place    . CHOLECYSTECTOMY    . EXCISION OF TONGUE LESION Left 07/11/2015   Procedure: EXCISION OF TONGUE LESION;  Surgeon: Melissa Montane, MD;  Location: [facility];  Service: ENT;  Laterality: Left;  . KNEE ARTHROSCOPY     right  . RADICAL NECK DISSECTION  Left 07/11/2015   Procedure: RADICAL NECK DISSECTION;  Surgeon: Melissa Montane, MD;  Location[facility] OR;  Service: ENT;  Laterality: Left;  Left neck dissection and left tongue resection  . THYROIDECTOMY, PARTIAL    . TUBAL LIGATION      There were no vitals filed for this visit.      Subjective Assessment - 10/05/17 1123    Subjective Pt states that she felt pretty rough yesterday because she had been sleeping on a pullout couch for the last few days. She is in about 4/10 pain currently.   Currently in Pain? Yes   Pain Score 4    Pain Location Hip   Pain Orientation Right;Left   Pain Descriptors / Indicators Aching;Tightness   Pain Type Chronic pain   Pain Onset More than a month ago   Pain Frequency Constant   Aggravating Factors  getting up and down,  walking   Pain Relieving Factors rest, stretches   Effect of Pain on Daily Activities increases              [facility] Adult PT Treatment/Exercise - 10/05/17 0001      Lumbar Exercises: Stretches   Single Knee to Chest Stretch 3 reps;30 seconds   Single Knee to Chest Stretch Limitations BLE   Double Knee to Chest Stretch 3 reps;30 seconds   Quadruped Mid Back Stretch Limitations RFISx 10 reps, decreased pain   ITB Stretch Limitations seated lumbar flexion stretch (hands to floor) 3x30 sec     Lumbar Exercises: Standing   Other Standing Lumbar Exercises posterior pelvic tilts against wall x15 reps     Lumbar Exercises: Seated   Sit to Stand 5 reps   Sit to Stand Limitations 2 sets, table elevated to 24", 1 to no UE   Other Seated Lumbar Exercises seated posterior pelvic rotation x10 reps     Lumbar Exercises: Supine   Other Supine Lumbar Exercises manual hip distraction 10x10-15" bouts each (decreased pain following)             PT Education - 10/05/17 1124    Education provided Yes   Education Details updated HEP, sit <> stand technique   Person(s) Educated Patient   Methods Explanation;Demonstration;Handout   Comprehension Verbalized understanding;Returned demonstration          PT Short Term Goals - 09/29/17 1120      PT SHORT TERM GOAL #1   Title Pt will be independent with HEP and consistently perform in order to maximize return to PLOF.   Time 4   Period Weeks   Status Achieved     PT SHORT TERM GOAL #2   Title Pt will have improved knee extension AROM by 10 deg or > to demonstrate improved quad/hip flexor flexibility in order to maximize gait and decrease LBP.   Baseline 10/11: RLE knee ext lacking 8 deg from full extension, LLE lacking 17 deg; hip extension measured in prone: R and L lacking 15 deg from neutral at rest   Time 4   Period Weeks   Status Partially Met     PT SHORT TERM GOAL #3   Title Pt will have improved tolerance to standing and  walking to 30 mins or > with 2 or fewer rest breaks required and 3/10 LBP or < to maximize her ability to perform chores for her mother   Baseline 9/13: standing and walking 45 mins without having to lean on the counter, pain averages about a 3/10 with standing  a walking   Time 4   Period Weeks   Status Achieved           PT Long Term Goals - 09/29/17 1120      PT LONG TERM GOAL #1   Title Pt will have improved MMT by 1 grade or > in all tested muscle groups to demonstrate improved strength, decrease pain, and maximize gait.   Baseline 10/11: MMT improved by 1/2 grade from inital eval   Time 8   Period Weeks   Status Partially Met     PT LONG TERM GOAL #2   Title Pt will have improved 5xSTS to 15 sec or < from chair with no UE support to demonstrate decreased pain and improved functional BLE strength.   Baseline 9/13: 28 sec with BUE   Time 8   Period Weeks   Status On-going     PT LONG TERM GOAL #3   Title Pt will have improved TUG with LRAD to 15 sec or < and with min to no gait deviations to demonstrate improved overall balance and function in order to promote participation at home and in the community.   Baseline 10/11: 51 sec with SPC, 26 sec with rollator   Time 8   Period Weeks   Status On-going     PT LONG TERM GOAL #4   Title Pt will be able to perform SLS on BLE for 10 sec or > with no UE support to improve balance and maximize gait.   Baseline 10/11: able to balance SLS on BLE for 10 sec with 1 UE    Time 8   Period Weeks   Status Partially Met     PT LONG TERM GOAL #5   Title Pt will have bil knee AROM to [facility] to demonstrate improved overall quad/hip flexor flexibility in order to decrease pain and maximize gait.   Time 8   Period Weeks   Status On-going     PT LONG TERM GOAL #6   Title Pt will have improved lumbar extension AROM to 50% limited or better to decrease pain and demonstrate improved flexibility in order to maximize gait and overall function.    Baseline 9/13: still significantly limited in extension   Time 8   Period Weeks   Status On-going               Plan - 10/05/17 1202    Clinical Impression Statement Session focused on improving lumbar flexion AROM and gluteal strengthening. Pt had difficulty dissociating her pelvis today during tilts but was able to demo understanding with posterior pelvic tilts in sitting with max verbal and tactile cueing. Pt performed sit <> stands from elevated surface with 1 to no UE; she verbalized gluteal fatigue at end of session but denied any tightness/pain as compared to beginning of session.    Rehab Potential Fair   PT Frequency 2x / week   PT Duration 8 weeks   PT Treatment/Interventions ADLs/Self Care Home Management;Cryotherapy;Electrical Stimulation;Moist Heat;DME Instruction;Gait training;Stair training;Functional mobility training;Therapeutic activities;Therapeutic exercise;Balance training;Neuromuscular re-education;Patient/family education;Manual techniques;Passive range of motion;Dry needling;Energy conservation;Taping   PT Next Visit Plan continue pelvic tilts in standing/against wall, continue RFIS, manual hip distraction, continue functional BLE strengthening, gait and balance training.   PT Home Exercise Plan eval: supine hip flexor stretch  8/22:  3D hip excursions, supine bridge, pelvic tilts, clams; 8/29: prone quad stretch with rope and pillows under hips; 9/13: supine frog stretch; 9/26: prone hip ext; 10/4:  modified HS stretch to 90/90 10/11: seated lumbar flexion stretch; 10/17: seated pelvic tilts, SKTC, DKTC   Consulted and Agree with Plan of Care Patient      Patient will benefit from skilled therapeutic intervention in order to improve the following deficits and impairments:  Abnormal gait, Decreased activity tolerance, Decreased balance, Decreased endurance, Decreased mobility, Decreased range of motion, Decreased strength, Difficulty walking, Hypomobility, Increased  muscle spasms, Impaired flexibility, Improper body mechanics, Postural dysfunction, Pain  Visit Diagnosis: Chronic bilateral low back pain without sciatica  Difficulty in walking, not elsewhere classified  Muscle weakness (generalized)  Abnormal posture  Other symptoms and signs involving the musculoskeletal system     Problem List Patient Active Problem List   Diagnosis Date Noted  . Encephalopathy 07/20/2017  . Pulmonary nodules 06/14/2017  . Carotid artery disease ([place]) 06/13/2016  . Flank pain 02/22/2016  . Cancer of border of tongue ([place]) 07/11/2015  . Diabetes mellitus type 2 in obese ([place]) 06/29/2015  . Radiculopathy of leg 11/18/2014  . Medicare annual wellness visit, subsequent 04/14/2014  . Urine frequency 04/14/2014  . Cervical cancer screening 04/11/2014  . Obesity 01/20/2014  . 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 Breast cancer screening 01/18/2014  . Low back pain 10/04/2012  . CTS (carpal tunnel syndrome) 06/07/2012  . Tinea corporis 06/07/2012  . Insomnia 06/07/2012  . Knee pain, bilateral 10/16/2011  . Hypothyroidism 01/22/2011  . HTN (hypertension) 01/22/2011  . SLEEP APNEA, OBSTRUCTIVE 01/22/2011  . INTRINSIC ASTHMA, UNSPECIFIED 01/22/2011  . ARTHRITIS, CHRONIC 01/22/2011  . RHEUMATIC FEVER, HX OF 01/22/2011  . CHICKENPOX, HX OF 01/22/2011  . 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 THYROID CYST 01/22/2011  . Hyperlipidemia, mixed 01/22/2011  . ALLERGIC RHINITIS, SEASONAL 01/22/2011  . GERD 01/22/2011  . IRRITABLE BOWEL SYNDROME 01/22/2011  . Disorder of bone and cartilage 01/22/2011  . CARDIAC MURMUR, HX OF 01/22/2011       Geraldine Solar PT, DPT  [facility] [address] [place], [place], [address] Phone: [phone]   Fax:  [phone]  Name: Amanda Carlson MRN: [id number] Date of Birth: 10-24-1946

## 2017-10-05 NOTE — Patient Instructions (Signed)
  Seated Posterior Pelvic Tilt   -Sit at edge of chair or firm surface with feet flat on floor -Place hands on hips and feel for pelvis -Using deep core muscles, rotate your hips upwards towards your ribs -Your back should flatten -Do not compensate with chest or shoulders -- keep shoulder still/steady -Hold this position for 5-10 seconds  Perform 1-2x/day, 2-3 sets of 10 reps   SINGLE KNEE TO CHEST STRETCH - SKTC  While Lying on your back, hold your knee and gently pull it up towards your chest.  Perform 1-2x/day, 3-5 stretches holding for 30-60 seconds on each leg   DOUBLE KNEE TO CHEST STRETCH - DKTC  While Lying on your back,  hold your knees and gently pull them up towards your chest.  Perform 1-2x/day, 3-5 stretches holding for 30-60 second

## 2017-10-06 ENCOUNTER — Encounter (HOSPITAL_COMMUNITY): Payer: Medicare Other

## 2017-10-10 ENCOUNTER — Ambulatory Visit (HOSPITAL_COMMUNITY): Payer: Medicare Other

## 2017-10-10 ENCOUNTER — Encounter (HOSPITAL_COMMUNITY): Payer: Self-pay

## 2017-10-10 DIAGNOSIS — R262 Difficulty in walking, not elsewhere classified: Secondary | ICD-10-CM | POA: Diagnosis not present

## 2017-10-10 DIAGNOSIS — R293 Abnormal posture: Secondary | ICD-10-CM | POA: Diagnosis not present

## 2017-10-10 DIAGNOSIS — M545 Low back pain, unspecified: Secondary | ICD-10-CM

## 2017-10-10 DIAGNOSIS — M6281 Muscle weakness (generalized): Secondary | ICD-10-CM

## 2017-10-10 DIAGNOSIS — G8929 Other chronic pain: Secondary | ICD-10-CM | POA: Diagnosis not present

## 2017-10-10 DIAGNOSIS — R29898 Other symptoms and signs involving the musculoskeletal system: Secondary | ICD-10-CM

## 2017-10-10 NOTE — Patient Instructions (Signed)
  Lumbar flexion standing toe touch/segmental flexion  Stand with your feet approximately shoulder width apart and reach down towards your toes. As you bend forward think about moving through each segment of your spine and letting your low back round.   Perform every 1-3 hours, 10-20 reps

## 2017-10-10 NOTE — Therapy (Signed)
[place], [place], [address] Phone: [phone]   Fax:  [phone]  Physical Therapy Treatment  Patient Details  Name: Amanda Carlson MRN: [id number] Date of Birth: 1946-02-14 Referring Provider: Mosie Lukes, MD  Encounter Date: 10/10/2017      PT End of Session - 10/10/17 1033    Visit Number 18   Number of Visits 25   Date for PT Re-Evaluation 10/27/17   Authorization Type Medicare Part A and B (gcodes done visit 15)   Authorization Time Period 08/04/17 to 09/29/17; NEW: 09/29/17 to 10/27/17   Authorization - Visit Number 18   Authorization - Number of Visits 25   PT Start Time 1031   PT Stop Time 1112   PT Time Calculation (min) 41 min   Equipment Utilized During Treatment Cervical collar   Activity Tolerance Patient tolerated treatment well;Patient limited by fatigue   Behavior During Therapy [facility] for tasks assessed/performed      Past Medical History:  Diagnosis Date  . ALLERGIC RHINITIS, SEASONAL 01/22/2011   Qualifier: Diagnosis of  By: Charlett Blake MD, Erline Levine    . Arthritis   . Breast cancer screening 01/18/2014   Calcified Right side? Follows at [facility]   . Bronchitis    hx of  . Cervical cancer screening 04/11/2014  . CTS (carpal tunnel syndrome) 06/07/2012  . Diabetes mellitus type 2 in obese ([place]) 06/29/2015  . Diabetes mellitus without complication ([place])   . Difficulty swallowing    Liquids and foods at times  . Family history of adverse reaction to anesthesia    Pts mother has a difficult time waking up after anesthesia  . GERD 01/22/2011   Qualifier: Diagnosis of  By: Charlett Blake MD, Erline Levine    . HYPOTHYROIDISM 01/22/2011   Qualifier: Diagnosis of  By: Charlett Blake MD, Boyce Medici with Dr Chalmers Cater   . Insomnia 06/07/2012  . Intrinsic asthma, unspecified 01/22/2011   Qualifier: Diagnosis of  By: Charlett Blake MD, Erline Levine    . Irritable bowel syndrome 01/22/2011   Qualifier: Diagnosis of  By: Charlett Blake MD, Erline Levine    . Knee  pain, bilateral 10/16/2011  . Knee pain, right 10/16/2011  . Lesion of breast 01/18/2014   Calcified Right side? Follows at [facility]  . Low back pain 10/04/2012  . Medicare annual wellness visit, subsequent 04/14/2014  . Numbness and tingling in hands   . Overweight(278.02) 01/22/2011   Qualifier: Diagnosis of  By: Charlett Blake MD, Erline Levine    . Pulmonary nodules 06/14/2017  . Rheumatic fever    as a child  . Shortness of breath dyspnea    with ambulation  . Sleep apnea    wears CPAP machine  . 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  SCC of tongue.    . THYROID CYST 01/22/2011   Qualifier: Diagnosis of  By: Charlett Blake MD, Erline Levine    . Tinea corporis 06/07/2012  . Tinea corporis 02/22/2016  . Tongue cancer ([place])   . Tongue lesion 01/18/2014    Past Surgical History:  Procedure Laterality Date  . cervical fusion, discectomy, metal plate and 2 screws in place    . CHOLECYSTECTOMY    . EXCISION OF TONGUE LESION Left 07/11/2015   Procedure: EXCISION OF TONGUE LESION;  Surgeon: Melissa Montane, MD;  Location: [place];  Service: ENT;  Laterality: Left;  . KNEE ARTHROSCOPY     right  . RADICAL NECK DISSECTION  Left 07/11/2015   Procedure: RADICAL NECK DISSECTION;  Surgeon: Melissa Montane, MD;  Location: [facility] OR;  Service: ENT;  Laterality: Left;  Left neck dissection and left tongue resection  . THYROIDECTOMY, PARTIAL    . TUBAL LIGATION      There were no vitals filed for this visit.      Subjective Assessment - 10/10/17 1033    Subjective Pt states that she is really stiff this morning since she had to wait a while in the lobby. She said she was a little sore following last treatment session.    Currently in Pain? Yes   Pain Score 5    Pain Location Hip   Pain Orientation Right;Left   Pain Descriptors / Indicators Aching;Tightness   Pain Type Chronic pain   Pain Onset More than a month ago   Pain Frequency Constant   Aggravating Factors  getting up and down,  wa;lomg   Pain Relieving Factors rest, stretches   Effect of Pain on Daily Activities increases               [facility] Adult PT Treatment/Exercise - 10/10/17 0001      Lumbar Exercises: Stretches   Single Knee to Chest Stretch 3 reps;30 seconds   Single Knee to Chest Stretch Limitations BLE   Double Knee to Chest Stretch 3 reps;30 seconds   Quadruped Mid Back Stretch Limitations RFISx 10 reps, decreased pain   ITB Stretch Limitations seated lumbar flexion stretch (hands to floor) 3x30 sec     Lumbar Exercises: Standing   Scapular Retraction Both;15 reps   Theraband Level (Scapular Retraction) Level 3 (Green)   Row Both;15 reps   Theraband Level (Row) Level 3 (Green)   Shoulder Adduction Limitations gait with SPC 84f x2; x15 ft with RW   Other Standing Lumbar Exercises bil SLS with 1-2 UE support, 5x10" each; standing march with 3" holds x10 each              PT Education - 10/10/17 1033    Education provided Yes   Education Details added RFIS to HEP   Person(s) Educated Patient   Methods Explanation;Demonstration;Handout   Comprehension Verbalized understanding;Returned demonstration          PT Short Term Goals - 09/29/17 1120      PT SHORT TERM GOAL #1   Title Pt will be independent with HEP and consistently perform in order to maximize return to PLOF.   Time 4   Period Weeks   Status Achieved     PT SHORT TERM GOAL #2   Title Pt will have improved knee extension AROM by 10 deg or > to demonstrate improved quad/hip flexor flexibility in order to maximize gait and decrease LBP.   Baseline 10/11: RLE knee ext lacking 8 deg from full extension, LLE lacking 17 deg; hip extension measured in prone: R and L lacking 15 deg from neutral at rest   Time 4   Period Weeks   Status Partially Met     PT SHORT TERM GOAL #3   Title Pt will have improved tolerance to standing and walking to 30 mins or > with 2 or fewer rest breaks required and 3/10 LBP or < to maximize  her ability to perform chores for her mother   Baseline 9/13: standing and walking 45 mins without having to lean on the counter, pain averages about a 3/10 with standing a walking   Time 4   Period Weeks  Status Achieved           PT Long Term Goals - 09/29/17 1120      PT LONG TERM GOAL #1   Title Pt will have improved MMT by 1 grade or > in all tested muscle groups to demonstrate improved strength, decrease pain, and maximize gait.   Baseline 10/11: MMT improved by 1/2 grade from inital eval   Time 8   Period Weeks   Status Partially Met     PT LONG TERM GOAL #2   Title Pt will have improved 5xSTS to 15 sec or < from chair with no UE support to demonstrate decreased pain and improved functional BLE strength.   Baseline 9/13: 28 sec with BUE   Time 8   Period Weeks   Status On-going     PT LONG TERM GOAL #3   Title Pt will have improved TUG with LRAD to 15 sec or < and with min to no gait deviations to demonstrate improved overall balance and function in order to promote participation at home and in the community.   Baseline 10/11: 51 sec with SPC, 26 sec with rollator   Time 8   Period Weeks   Status On-going     PT LONG TERM GOAL #4   Title Pt will be able to perform SLS on BLE for 10 sec or > with no UE support to improve balance and maximize gait.   Baseline 10/11: able to balance SLS on BLE for 10 sec with 1 UE    Time 8   Period Weeks   Status Partially Met     PT LONG TERM GOAL #5   Title Pt will have bil knee AROM to [facility] to demonstrate improved overall quad/hip flexor flexibility in order to decrease pain and maximize gait.   Time 8   Period Weeks   Status On-going     PT LONG TERM GOAL #6   Title Pt will have improved lumbar extension AROM to 50% limited or better to decrease pain and demonstrate improved flexibility in order to maximize gait and overall function.   Baseline 9/13: still significantly limited in extension   Time 8   Period Weeks   Status  On-going               Plan - 10/10/17 1114    Clinical Impression Statement Pt presents to therapy with c/o increased stiffness this date. Pt initially had difficulty with the stretches at the beginning of session due to this increased stiffness but following that, she felt better. Ambulated with cane following stretches, which pt tolerated well but was limited by fatigue. Assessed gait with RW compared to rollator and pt noted to be much more steady. Encouraged pt to begin using her RW at home instead of her rollator and she verbalized understanding. Rest of session focused on posture, and balance. Pt reported much improved stiffness at EOS.   Rehab Potential Fair   PT Frequency 2x / week   PT Duration 8 weeks   PT Treatment/Interventions ADLs/Self Care Home Management;Cryotherapy;Electrical Stimulation;Moist Heat;DME Instruction;Gait training;Stair training;Functional mobility training;Therapeutic activities;Therapeutic exercise;Balance training;Neuromuscular re-education;Patient/family education;Manual techniques;Passive range of motion;Dry needling;Energy conservation;Taping   PT Next Visit Plan continue pelvic tilts in standing/against wall, manual hip distraction, continue functional BLE strengthening, gait and balance training.   PT Home Exercise Plan eval: supine hip flexor stretch  8/22:  3D hip excursions, supine bridge, pelvic tilts, clams; 8/29: prone quad stretch with rope and pillows  under hips; 9/13: supine frog stretch; 9/26: prone hip ext; 10/4: modified HS stretch to 90/90 10/11: seated lumbar flexion stretch; 10/17: seated pelvic tilts, SKTC, DKTC; 10/22: RFIS   Consulted and Agree with Plan of Care Patient      Patient will benefit from skilled therapeutic intervention in order to improve the following deficits and impairments:  Abnormal gait, Decreased activity tolerance, Decreased balance, Decreased endurance, Decreased mobility, Decreased range of motion, Decreased  strength, Difficulty walking, Hypomobility, Increased muscle spasms, Impaired flexibility, Improper body mechanics, Postural dysfunction, Pain  Visit Diagnosis: Chronic bilateral low back pain without sciatica  Difficulty in walking, not elsewhere classified  Muscle weakness (generalized)  Abnormal posture  Other symptoms and signs involving the musculoskeletal system     Problem List Patient Active Problem List   Diagnosis Date Noted  . Encephalopathy 07/20/2017  . Pulmonary nodules 06/14/2017  . Carotid artery disease ([place]) 06/13/2016  . Flank pain 02/22/2016  . Cancer of border of tongue ([place]) 07/11/2015  . Diabetes mellitus type 2 in obese ([place]) 06/29/2015  . Radiculopathy of leg 11/18/2014  . Medicare annual wellness visit, subsequent 04/14/2014  . Urine frequency 04/14/2014  . Cervical cancer screening 04/11/2014  . Obesity 01/20/2014  . 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 Breast cancer screening 01/18/2014  . Low back pain 10/04/2012  . CTS (carpal tunnel syndrome) 06/07/2012  . Tinea corporis 06/07/2012  . Insomnia 06/07/2012  . Knee pain, bilateral 10/16/2011  . Hypothyroidism 01/22/2011  . HTN (hypertension) 01/22/2011  . SLEEP APNEA, OBSTRUCTIVE 01/22/2011  . INTRINSIC ASTHMA, UNSPECIFIED 01/22/2011  . ARTHRITIS, CHRONIC 01/22/2011  . RHEUMATIC FEVER, HX OF 01/22/2011  . CHICKENPOX, HX OF 01/22/2011  . 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 THYROID CYST 01/22/2011  . Hyperlipidemia, mixed 01/22/2011  . ALLERGIC RHINITIS, SEASONAL 01/22/2011  . GERD 01/22/2011  . IRRITABLE BOWEL SYNDROME 01/22/2011  . Disorder of bone and cartilage 01/22/2011  . CARDIAC MURMUR, HX OF 01/22/2011        Geraldine Solar PT, DPT  [facility] [address] [place], [place], [address] Phone: [phone]   Fax:  [phone]  Name: BRIGIDA SCOTTI MRN: [id number] Date of Birth: 1946-11-25

## 2017-10-11 ENCOUNTER — Ambulatory Visit: Payer: Medicare Other | Admitting: Family Medicine

## 2017-10-11 ENCOUNTER — Other Ambulatory Visit: Payer: Self-pay | Admitting: Family Medicine

## 2017-10-13 ENCOUNTER — Encounter (HOSPITAL_COMMUNITY): Payer: Self-pay

## 2017-10-13 ENCOUNTER — Ambulatory Visit (HOSPITAL_COMMUNITY): Payer: Medicare Other

## 2017-10-13 DIAGNOSIS — G8929 Other chronic pain: Secondary | ICD-10-CM

## 2017-10-13 DIAGNOSIS — R293 Abnormal posture: Secondary | ICD-10-CM | POA: Diagnosis not present

## 2017-10-13 DIAGNOSIS — R29898 Other symptoms and signs involving the musculoskeletal system: Secondary | ICD-10-CM | POA: Diagnosis not present

## 2017-10-13 DIAGNOSIS — R262 Difficulty in walking, not elsewhere classified: Secondary | ICD-10-CM | POA: Diagnosis not present

## 2017-10-13 DIAGNOSIS — M545 Low back pain: Secondary | ICD-10-CM | POA: Diagnosis not present

## 2017-10-13 DIAGNOSIS — M6281 Muscle weakness (generalized): Secondary | ICD-10-CM

## 2017-10-13 NOTE — Therapy (Signed)
[facility] [address] [place], [place], [address] Phone: [phone]   Fax:  [phone]  Physical Therapy Treatment  Patient Details  Name: Amanda Carlson MRN: [id number] Date of Birth: 09/19/1946 Referring Provider: Stacey A Blyth, MD  Encounter Date: 10/13/2017      PT End of Session - 10/13/17 1103    Visit Number 19   Number of Visits 25   Date for PT Re-Evaluation 10/27/17   Authorization Type Medicare Part A and B (gcodes done visit 15)   Authorization Time Period 08/04/17 to 09/29/17; NEW: 09/29/17 to 10/27/17   Authorization - Visit Number 19   Authorization - Number of Visits 25   PT Start Time 1105   PT Stop Time 1146   PT Time Calculation (min) 41 min   Equipment Utilized During Treatment Cervical collar   Activity Tolerance Patient tolerated treatment well;Patient limited by fatigue   Behavior During Therapy WFL for tasks assessed/performed      Past Medical History:  Diagnosis Date  . ALLERGIC RHINITIS, SEASONAL 01/22/2011   Qualifier: Diagnosis of  By: Blyth MD, Stacey    . Arthritis   . Breast cancer screening 01/18/2014   Calcified Right side? Follows at [facility]   . Bronchitis    hx of  . Cervical cancer screening 04/11/2014  . CTS (carpal tunnel syndrome) 06/07/2012  . Diabetes mellitus type 2 in obese ([facility]) 06/29/2015  . Diabetes mellitus without complication ([facility])   . Difficulty swallowing    Liquids and foods at times  . Family history of adverse reaction to anesthesia    Pts mother has a difficult time waking up after anesthesia  . GERD 01/22/2011   Qualifier: Diagnosis of  By: Blyth MD, Stacey    . HYPOTHYROIDISM 01/22/2011   Qualifier: Diagnosis of  By: Blyth MD, Stacey  Follows with Dr Balan   . Insomnia 06/07/2012  . Intrinsic asthma, unspecified 01/22/2011   Qualifier: Diagnosis of  By: Blyth MD, Stacey    . Irritable bowel syndrome 01/22/2011   Qualifier: Diagnosis of  By: Blyth MD, Stacey    . Knee  pain, bilateral 10/16/2011  . Knee pain, right 10/16/2011  . Lesion of breast 01/18/2014   Calcified Right side? Follows at [facility]  . Low back pain 10/04/2012  . Medicare annual wellness visit, subsequent 04/14/2014  . Numbness and tingling in hands   . Overweight(278.02) 01/22/2011   Qualifier: Diagnosis of  By: Blyth MD, Stacey    . Pulmonary nodules 06/14/2017  . Rheumatic fever    as a child  . Shortness of breath dyspnea    with ambulation  . Sleep apnea    wears CPAP machine  . Squamous cell carcinoma of tongue ([facility]) 01/18/2014   Surgically excidesed by Dr John Byers.  SCC of tongue.    . THYROID CYST 01/22/2011   Qualifier: Diagnosis of  By: Blyth MD, Stacey    . Tinea corporis 06/07/2012  . Tinea corporis 02/22/2016  . Tongue cancer ([facility])   . Tongue lesion 01/18/2014    Past Surgical History:  Procedure Laterality Date  . cervical fusion, discectomy, metal plate and 2 screws in place    . CHOLECYSTECTOMY    . EXCISION OF TONGUE LESION Left 07/11/2015   Procedure: EXCISION OF TONGUE LESION;  Surgeon: John Byers, MD;  Location: [facility] OR;  Service: ENT;  Laterality: Left;  . KNEE ARTHROSCOPY     right  . RADICAL NECK DISSECTION   Left 07/11/2015   Procedure: RADICAL NECK DISSECTION;  Surgeon: Melissa Montane, MD;  Location: [facility] OR;  Service: ENT;  Laterality: Left;  Left neck dissection and left tongue resection  . THYROIDECTOMY, PARTIAL    . TUBAL LIGATION      There were no vitals filed for this visit.      Subjective Assessment - 10/13/17 1111    Subjective Pt states that she stiffens up every time she sits down for any period of time. She presented to therapy with her rollator and didn't realize she was supposed to come with her RW.    Currently in Pain? Yes   Pain Score 5    Pain Location Hip   Pain Orientation Right;Left   Pain Descriptors / Indicators Aching;Tightness   Pain Type Chronic pain   Pain Onset More than a month ago   Pain Frequency Constant   Aggravating Factors   getting up and down   Pain Relieving Factors rest, stretches   Effect of Pain on Daily Activities increases               [facility] Adult PT Treatment/Exercise - 10/13/17 0001      Lumbar Exercises: Stretches   ITB Stretch Limitations seated lumbar flexion stretch (hands to floor) 3x30 sec     Lumbar Exercises: Standing   Other Standing Lumbar Exercises bil staggered stance with UE flexion x10 each   Other Standing Lumbar Exercises fwd/lat step over 6" hurdle x5RT each with BUE support     Lumbar Exercises: Seated   LAQ on Ball Limitations scap retraction and shoulder ext with RTB x15 each on dyna disc   Hip Flexion on Ball Limitations seated posterior pelvic tilts on dynadisc 15x10" holds   Other Seated Lumbar Exercises 3D thoracic excursions x10 reps each              PT Education - 10/13/17 1103    Education provided Yes   Education Details updated 3D thoracic excursion technique   Person(s) Educated Patient   Methods Explanation;Demonstration;Handout   Comprehension Verbalized understanding;Returned demonstration          PT Short Term Goals - 09/29/17 1120      PT SHORT TERM GOAL #1   Title Pt will be independent with HEP and consistently perform in order to maximize return to PLOF.   Time 4   Period Weeks   Status Achieved     PT SHORT TERM GOAL #2   Title Pt will have improved knee extension AROM by 10 deg or > to demonstrate improved quad/hip flexor flexibility in order to maximize gait and decrease LBP.   Baseline 10/11: RLE knee ext lacking 8 deg from full extension, LLE lacking 17 deg; hip extension measured in prone: R and L lacking 15 deg from neutral at rest   Time 4   Period Weeks   Status Partially Met     PT SHORT TERM GOAL #3   Title Pt will have improved tolerance to standing and walking to 30 mins or > with 2 or fewer rest breaks required and 3/10 LBP or < to maximize her ability to perform chores for her mother   Baseline 9/13: standing  and walking 45 mins without having to lean on the counter, pain averages about a 3/10 with standing a walking   Time 4   Period Weeks   Status Achieved           PT Long Term Goals - 09/29/17  1120      PT LONG TERM GOAL #1   Title Pt will have improved MMT by 1 grade or > in all tested muscle groups to demonstrate improved strength, decrease pain, and maximize gait.   Baseline 10/11: MMT improved by 1/2 grade from inital eval   Time 8   Period Weeks   Status Partially Met     PT LONG TERM GOAL #2   Title Pt will have improved 5xSTS to 15 sec or < from chair with no UE support to demonstrate decreased pain and improved functional BLE strength.   Baseline 9/13: 28 sec with BUE   Time 8   Period Weeks   Status On-going     PT LONG TERM GOAL #3   Title Pt will have improved TUG with LRAD to 15 sec or < and with min to no gait deviations to demonstrate improved overall balance and function in order to promote participation at home and in the community.   Baseline 10/11: 51 sec with SPC, 26 sec with rollator   Time 8   Period Weeks   Status On-going     PT LONG TERM GOAL #4   Title Pt will be able to perform SLS on BLE for 10 sec or > with no UE support to improve balance and maximize gait.   Baseline 10/11: able to balance SLS on BLE for 10 sec with 1 UE    Time 8   Period Weeks   Status Partially Met     PT LONG TERM GOAL #5   Title Pt will have bil knee AROM to WFL to demonstrate improved overall quad/hip flexor flexibility in order to decrease pain and maximize gait.   Time 8   Period Weeks   Status On-going     PT LONG TERM GOAL #6   Title Pt will have improved lumbar extension AROM to 50% limited or better to decrease pain and demonstrate improved flexibility in order to maximize gait and overall function.   Baseline 9/13: still significantly limited in extension   Time 8   Period Weeks   Status On-going               Plan - 10/13/17 1104    Clinical  Impression Statement Pt continues to c/o increased stiffness after waiting in the lobby for her appointment. Continued to focus on improving lumbar flexion ROM, balance, gait, and improved general mobility. Pt unable to amb with SPC due to increased stiffness and general unsteadiness. Pt with max use of UE during functional/dynamic balance activities and during sit <> stands this date. Asked pt to come to therapy with her RW next session since she is more steady with it and she verbalized understanding.   Rehab Potential Fair   PT Frequency 2x / week   PT Duration 4 weeks  correction from reassessment on 10/11 when pt was recerted 2x/week for 4 more weeks   PT Treatment/Interventions ADLs/Self Care Home Management;Cryotherapy;Electrical Stimulation;Moist Heat;DME Instruction;Gait training;Stair training;Functional mobility training;Therapeutic activities;Therapeutic exercise;Balance training;Neuromuscular re-education;Patient/family education;Manual techniques;Passive range of motion;Dry needling;Energy conservation;Taping   PT Next Visit Plan continue pelvic tilts on dyna disc, manual hip distraction, continue functional BLE strengthening, gait and balance training.   PT Home Exercise Plan eval: supine hip flexor stretch  8/22:  3D hip excursions, supine bridge, pelvic tilts, clams; 8/29: prone quad stretch with rope and pillows under hips; 9/13: supine frog stretch; 9/26: prone hip ext; 10/4: modified HS stretch to 90/90 10/11:   seated lumbar flexion stretch; 10/17: seated pelvic tilts, SKTC, DKTC; 10/22: RFIS   Consulted and Agree with Plan of Care Patient      Patient will benefit from skilled therapeutic intervention in order to improve the following deficits and impairments:  Abnormal gait, Decreased activity tolerance, Decreased balance, Decreased endurance, Decreased mobility, Decreased range of motion, Decreased strength, Difficulty walking, Hypomobility, Increased muscle spasms, Impaired  flexibility, Improper body mechanics, Postural dysfunction, Pain  Visit Diagnosis: Chronic bilateral low back pain without sciatica  Difficulty in walking, not elsewhere classified  Muscle weakness (generalized)  Abnormal posture  Other symptoms and signs involving the musculoskeletal system     Problem List Patient Active Problem List   Diagnosis Date Noted  . Encephalopathy 07/20/2017  . Pulmonary nodules 06/14/2017  . Carotid artery disease ([facility]) 06/13/2016  . Flank pain 02/22/2016  . Cancer of border of tongue ([facility]) 07/11/2015  . Diabetes mellitus type 2 in obese ([facility]) 06/29/2015  . Radiculopathy of leg 11/18/2014  . Medicare annual wellness visit, subsequent 04/14/2014  . Urine frequency 04/14/2014  . Cervical cancer screening 04/11/2014  . Obesity 01/20/2014  . Squamous cell carcinoma of tongue ([facility]) 01/18/2014  . Breast cancer screening 01/18/2014  . Low back pain 10/04/2012  . CTS (carpal tunnel syndrome) 06/07/2012  . Tinea corporis 06/07/2012  . Insomnia 06/07/2012  . Knee pain, bilateral 10/16/2011  . Hypothyroidism 01/22/2011  . HTN (hypertension) 01/22/2011  . SLEEP APNEA, OBSTRUCTIVE 01/22/2011  . INTRINSIC ASTHMA, UNSPECIFIED 01/22/2011  . ARTHRITIS, CHRONIC 01/22/2011  . RHEUMATIC FEVER, HX OF 01/22/2011  . CHICKENPOX, HX OF 01/22/2011  . 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 THYROID CYST 01/22/2011  . Hyperlipidemia, mixed 01/22/2011  . ALLERGIC RHINITIS, SEASONAL 01/22/2011  . GERD 01/22/2011  . IRRITABLE BOWEL SYNDROME 01/22/2011  . Disorder of bone and cartilage 01/22/2011  . CARDIAC MURMUR, HX OF 01/22/2011       Brooke Powell PT, DPT  [facility] [address] [place], [place], [address] Phone: [phone]   Fax:  [phone]  Name: Geanine L Droke MRN: [id number] Date of Birth: 08/11/1946   

## 2017-10-17 ENCOUNTER — Encounter (HOSPITAL_COMMUNITY): Payer: Self-pay

## 2017-10-17 ENCOUNTER — Ambulatory Visit (HOSPITAL_COMMUNITY): Payer: Medicare Other

## 2017-10-17 DIAGNOSIS — M6281 Muscle weakness (generalized): Secondary | ICD-10-CM | POA: Diagnosis not present

## 2017-10-17 DIAGNOSIS — G8929 Other chronic pain: Secondary | ICD-10-CM

## 2017-10-17 DIAGNOSIS — M545 Low back pain, unspecified: Secondary | ICD-10-CM

## 2017-10-17 DIAGNOSIS — R29898 Other symptoms and signs involving the musculoskeletal system: Secondary | ICD-10-CM

## 2017-10-17 DIAGNOSIS — R262 Difficulty in walking, not elsewhere classified: Secondary | ICD-10-CM

## 2017-10-17 DIAGNOSIS — R293 Abnormal posture: Secondary | ICD-10-CM | POA: Diagnosis not present

## 2017-10-17 NOTE — Therapy (Signed)
[place], [place], [address] Phone: [phone]   Fax:  [phone]  Physical Therapy Treatment  Patient Details  Name: Amanda Carlson MRN: [id number] Date of Birth: May 15, 1946 Referring Provider: Mosie Lukes, MD  Encounter Date: 10/17/2017      PT End of Session - 10/17/17 1038    Visit Number 20   Number of Visits 25   Date for PT Re-Evaluation 10/27/17   Authorization Type Medicare Part A and B (gcodes done visit 15)   Authorization Time Period 08/04/17 to 09/29/17; NEW: 09/29/17 to 10/27/17   Authorization - Visit Number 40   Authorization - Number of Visits 25   PT Start Time 9628   PT Stop Time 1114   PT Time Calculation (min) 39 min   Equipment Utilized During Treatment Cervical collar   Activity Tolerance Patient tolerated treatment well;Patient limited by fatigue   Behavior During Therapy [facility] for tasks assessed/performed      Past Medical History:  Diagnosis Date  . ALLERGIC RHINITIS, SEASONAL 01/22/2011   Qualifier: Diagnosis of  By: Charlett Blake MD, Erline Levine    . Arthritis   . Breast cancer screening 01/18/2014   Calcified Right side? Follows at [place]   . Bronchitis    hx of  . Cervical cancer screening 04/11/2014  . CTS (carpal tunnel syndrome) 06/07/2012  . Diabetes mellitus type 2 in obese ([place]) 06/29/2015  . Diabetes mellitus without complication ([place])   . Difficulty swallowing    Liquids and foods at times  . Family history of adverse reaction to anesthesia    Pts mother has a difficult time waking up after anesthesia  . GERD 01/22/2011   Qualifier: Diagnosis of  By: Charlett Blake MD, Erline Levine    . HYPOTHYROIDISM 01/22/2011   Qualifier: Diagnosis of  By: Charlett Blake MD, Boyce Medici with Dr Chalmers Cater   . Insomnia 06/07/2012  . Intrinsic asthma, unspecified 01/22/2011   Qualifier: Diagnosis of  By: Charlett Blake MD, Erline Levine    . Irritable bowel syndrome 01/22/2011   Qualifier: Diagnosis of  By: Charlett Blake MD, Erline Levine    . Knee  pain, bilateral 10/16/2011  . Knee pain, right 10/16/2011  . Lesion of breast 01/18/2014   Calcified Right side? Follows at [place]  . Low back pain 10/04/2012  . Medicare annual wellness visit, subsequent 04/14/2014  . Numbness and tingling in hands   . Overweight(278.02) 01/22/2011   Qualifier: Diagnosis of  By: Charlett Blake MD, Erline Levine    . Pulmonary nodules 06/14/2017  . Rheumatic fever    as a child  . Shortness of breath dyspnea    with ambulation  . Sleep apnea    wears CPAP machine  . 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  SCC of tongue.    . THYROID CYST 01/22/2011   Qualifier: Diagnosis of  By: Charlett Blake MD, Erline Levine    . Tinea corporis 06/07/2012  . Tinea corporis 02/22/2016  . Tongue cancer ([place])   . Tongue lesion 01/18/2014    Past Surgical History:  Procedure Laterality Date  . cervical fusion, discectomy, metal plate and 2 screws in place    . CHOLECYSTECTOMY    . EXCISION OF TONGUE LESION Left 07/11/2015   Procedure: EXCISION OF TONGUE LESION;  Surgeon: Melissa Montane, MD;  Location: [place];  Service: ENT;  Laterality: Left;  . KNEE ARTHROSCOPY     right  . RADICAL NECK DISSECTION  Left 07/11/2015   Procedure: RADICAL NECK DISSECTION;  Surgeon: Melissa Montane, MD;  Location[facility];  Service: ENT;  Laterality: Left;  Left neck dissection and left tongue resection  . THYROIDECTOMY, PARTIAL    . TUBAL LIGATION      There were no vitals filed for this visit.      Subjective Assessment - 10/17/17 1038    Subjective Pt states that she forgot her RW again due to waking up late and running late. She will bring it next visit. No pain currently.   Currently in Pain? No/denies   Pain Onset More than a month ago                 [facility] Adult PT Treatment/Exercise - 10/17/17 0001      Lumbar Exercises: Stretches   ITB Stretch Limitations seated lumbar flexion stretch (hands to floor) 3x30 sec     Lumbar Exercises: Standing   Functional  Squats Limitations attempted but pt had significant difficulty completing due to feeling as though her knee was going to give out   Wall Slides --  attempted but pt's knee pain and unsteadiness limiting   Shoulder Extension Limitations fwd step ups on 2" step x10 reps each with 1 UE support   Shoulder Adduction Limitations attempted again this date but pt unsteady and wished to return to her rollator   Other Standing Lumbar Exercises bil SLS with vectors with BUE support x3RT     Lumbar Exercises: Seated   Long Arc Quad on Chair Both;15 reps   LAQ on Chair Weights (lbs) 2   LAQ on [place] Limitations scap retraction and shoulder ext with RTB x20 each on dyna disc; D2 Flexion PNF x10 reps and trunk rotations x10 reps each with RTB on dyna disc   Hip Flexion on Ball Limitations seated posterior pelvic tilts on dynadisc 15x10" holds   Sit to Stand Limitations x5 with mat table slightly elevated, BUE on table and then on thighs to complete              PT Education - 10/17/17 1039    Education provided Yes   Education Details continue HEP, focus on decreasing UE support and increasing LE    Person(s) Educated Patient   Methods Explanation;Demonstration   Comprehension Verbalized understanding;Returned demonstration          PT Short Term Goals - 09/29/17 1120      PT SHORT TERM GOAL #1   Title Pt will be independent with HEP and consistently perform in order to maximize return to PLOF.   Time 4   Period Weeks   Status Achieved     PT SHORT TERM GOAL #2   Title Pt will have improved knee extension AROM by 10 deg or > to demonstrate improved quad/hip flexor flexibility in order to maximize gait and decrease LBP.   Baseline 10/11: RLE knee ext lacking 8 deg from full extension, LLE lacking 17 deg; hip extension measured in prone: R and L lacking 15 deg from neutral at rest   Time 4   Period Weeks   Status Partially Met     PT SHORT TERM GOAL #3   Title Pt will have improved  tolerance to standing and walking to 30 mins or > with 2 or fewer rest breaks required and 3/10 LBP or < to maximize her ability to perform chores for her mother   Baseline 9/13: standing and walking 45 mins without having to lean  on the counter, pain averages about a 3/10 with standing a walking   Time 4   Period Weeks   Status Achieved           PT Long Term Goals - 09/29/17 1120      PT LONG TERM GOAL #1   Title Pt will have improved MMT by 1 grade or > in all tested muscle groups to demonstrate improved strength, decrease pain, and maximize gait.   Baseline 10/11: MMT improved by 1/2 grade from inital eval   Time 8   Period Weeks   Status Partially Met     PT LONG TERM GOAL #2   Title Pt will have improved 5xSTS to 15 sec or < from chair with no UE support to demonstrate decreased pain and improved functional BLE strength.   Baseline 9/13: 28 sec with BUE   Time 8   Period Weeks   Status On-going     PT LONG TERM GOAL #3   Title Pt will have improved TUG with LRAD to 15 sec or < and with min to no gait deviations to demonstrate improved overall balance and function in order to promote participation at home and in the community.   Baseline 10/11: 51 sec with SPC, 26 sec with rollator   Time 8   Period Weeks   Status On-going     PT LONG TERM GOAL #4   Title Pt will be able to perform SLS on BLE for 10 sec or > with no UE support to improve balance and maximize gait.   Baseline 10/11: able to balance SLS on BLE for 10 sec with 1 UE    Time 8   Period Weeks   Status Partially Met     PT LONG TERM GOAL #5   Title Pt will have bil knee AROM to [facility] to demonstrate improved overall quad/hip flexor flexibility in order to decrease pain and maximize gait.   Time 8   Period Weeks   Status On-going     PT LONG TERM GOAL #6   Title Pt will have improved lumbar extension AROM to 50% limited or better to decrease pain and demonstrate improved flexibility in order to maximize gait  and overall function.   Baseline 9/13: still significantly limited in extension   Time 8   Period Weeks   Status On-going               Plan - 10/17/17 1116    Clinical Impression Statement Pt did not wait in lobby as long today as she arrived later than usual so she was not as stiff at beginning of session. Today's session focused on improving core strength, posture, mobility, and functional strength. She continues to demo significant difficulty with sit <> stands. Attempted squats and wall slides this date but pt unsteady and reported that her knee was going to buckle so did not continue. She did well with step ups but continues to require UE support due to BLE weakness.   Rehab Potential Fair   PT Frequency 2x / week   PT Duration 4 weeks  correction from reassessment on 03/21 when pt was recerted 2x/week for 4 more weeks   PT Treatment/Interventions ADLs/Self Care Home Management;Cryotherapy;Electrical Stimulation;Moist Heat;DME Instruction;Gait training;Stair training;Functional mobility training;Therapeutic activities;Therapeutic exercise;Balance training;Neuromuscular re-education;Patient/family education;Manual techniques;Passive range of motion;Dry needling;Energy conservation;Taping   PT Next Visit Plan continue pelvic tilts on dyna disc, manual hip distraction, continue functional BLE strengthening, gait and balance training.  PT Home Exercise Plan eval: supine hip flexor stretch  8/22:  3D hip excursions, supine bridge, pelvic tilts, clams; 8/29: prone quad stretch with rope and pillows under hips; 9/13: supine frog stretch; 9/26: prone hip ext; 10/4: modified HS stretch to 90/90 10/11: seated lumbar flexion stretch; 10/17: seated pelvic tilts, SKTC, DKTC; 10/22: RFIS   Consulted and Agree with Plan of Care Patient      Patient will benefit from skilled therapeutic intervention in order to improve the following deficits and impairments:  Abnormal gait, Decreased activity  tolerance, Decreased balance, Decreased endurance, Decreased mobility, Decreased range of motion, Decreased strength, Difficulty walking, Hypomobility, Increased muscle spasms, Impaired flexibility, Improper body mechanics, Postural dysfunction, Pain  Visit Diagnosis: Chronic bilateral low back pain without sciatica  Difficulty in walking, not elsewhere classified  Muscle weakness (generalized)  Abnormal posture  Other symptoms and signs involving the musculoskeletal system     Problem List Patient Active Problem List   Diagnosis Date Noted  . Encephalopathy 07/20/2017  . Pulmonary nodules 06/14/2017  . Carotid artery disease ([place]) 06/13/2016  . Flank pain 02/22/2016  . Cancer of border of tongue ([place]) 07/11/2015  . Diabetes mellitus type 2 in obese ([place]) 06/29/2015  . Radiculopathy of leg 11/18/2014  . Medicare annual wellness visit, subsequent 04/14/2014  . Urine frequency 04/14/2014  . Cervical cancer screening 04/11/2014  . Obesity 01/20/2014  . 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 Breast cancer screening 01/18/2014  . Low back pain 10/04/2012  . CTS (carpal tunnel syndrome) 06/07/2012  . Tinea corporis 06/07/2012  . Insomnia 06/07/2012  . Knee pain, bilateral 10/16/2011  . Hypothyroidism 01/22/2011  . HTN (hypertension) 01/22/2011  . SLEEP APNEA, OBSTRUCTIVE 01/22/2011  . INTRINSIC ASTHMA, UNSPECIFIED 01/22/2011  . ARTHRITIS, CHRONIC 01/22/2011  . RHEUMATIC FEVER, HX OF 01/22/2011  . CHICKENPOX, HX OF 01/22/2011  . 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 THYROID CYST 01/22/2011  . Hyperlipidemia, mixed 01/22/2011  . ALLERGIC RHINITIS, SEASONAL 01/22/2011  . GERD 01/22/2011  . IRRITABLE BOWEL SYNDROME 01/22/2011  . Disorder of bone and cartilage 01/22/2011  . CARDIAC MURMUR, HX OF 01/22/2011      Geraldine Solar PT, DPT  [place] [address] [place], [place], [address] Phone: [phone]   Fax:   [phone]  Name: Amanda Carlson MRN: [id number] Date of Birth: Jul 23, 1946

## 2017-10-19 ENCOUNTER — Ambulatory Visit (HOSPITAL_COMMUNITY): Payer: Medicare Other | Admitting: Physical Therapy

## 2017-10-19 DIAGNOSIS — M6281 Muscle weakness (generalized): Secondary | ICD-10-CM

## 2017-10-19 DIAGNOSIS — M545 Low back pain: Principal | ICD-10-CM

## 2017-10-19 DIAGNOSIS — G8929 Other chronic pain: Secondary | ICD-10-CM

## 2017-10-19 DIAGNOSIS — R29898 Other symptoms and signs involving the musculoskeletal system: Secondary | ICD-10-CM | POA: Diagnosis not present

## 2017-10-19 DIAGNOSIS — R262 Difficulty in walking, not elsewhere classified: Secondary | ICD-10-CM

## 2017-10-19 DIAGNOSIS — R293 Abnormal posture: Secondary | ICD-10-CM

## 2017-10-19 NOTE — Therapy (Signed)
[place], [place], [address] Phone: [phone]   Fax:  [phone]  Physical Therapy Treatment  Patient Details  Name: Amanda Carlson MRN: [id number] Date of Birth: 08-Apr-1946 Referring Provider: Mosie Lukes, MD  Encounter Date: 10/19/2017      PT End of Session - 10/19/17 1225    Visit Number 21   Number of Visits 25   Date for PT Re-Evaluation 10/27/17   Authorization Type Medicare Part A and B (gcodes done visit 15)   Authorization Time Period 08/04/17 to 09/29/17; NEW: 09/29/17 to 10/27/17   Authorization - Visit Number 21   Authorization - Number of Visits 25   PT Start Time 1036   PT Stop Time 1115   PT Time Calculation (min) 39 min   Equipment Utilized During Treatment Cervical collar   Activity Tolerance Patient tolerated treatment well;Patient limited by fatigue   Behavior During Therapy [facility] for tasks assessed/performed      Past Medical History:  Diagnosis Date  . ALLERGIC RHINITIS, SEASONAL 01/22/2011   Qualifier: Diagnosis of  By: Charlett Blake MD, Erline Levine    . Arthritis   . Breast cancer screening 01/18/2014   Calcified Right side? Follows at [place]   . Bronchitis    hx of  . Cervical cancer screening 04/11/2014  . CTS (carpal tunnel syndrome) 06/07/2012  . Diabetes mellitus type 2 in obese ([place]) 06/29/2015  . Diabetes mellitus without complication ([place])   . Difficulty swallowing    Liquids and foods at times  . Family history of adverse reaction to anesthesia    Pts mother has a difficult time waking up after anesthesia  . GERD 01/22/2011   Qualifier: Diagnosis of  By: Charlett Blake MD, Erline Levine    . HYPOTHYROIDISM 01/22/2011   Qualifier: Diagnosis of  By: Charlett Blake MD, Boyce Medici with Dr Chalmers Cater   . Insomnia 06/07/2012  . Intrinsic asthma, unspecified 01/22/2011   Qualifier: Diagnosis of  By: Charlett Blake MD, Erline Levine    . Irritable bowel syndrome 01/22/2011   Qualifier: Diagnosis of  By: Charlett Blake MD, Erline Levine    . Knee  pain, bilateral 10/16/2011  . Knee pain, right 10/16/2011  . Lesion of breast 01/18/2014   Calcified Right side? Follows at [place]  . Low back pain 10/04/2012  . Medicare annual wellness visit, subsequent 04/14/2014  . Numbness and tingling in hands   . Overweight(278.02) 01/22/2011   Qualifier: Diagnosis of  By: Charlett Blake MD, Erline Levine    . Pulmonary nodules 06/14/2017  . Rheumatic fever    as a child  . Shortness of breath dyspnea    with ambulation  . Sleep apnea    wears CPAP machine  . 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  SCC of tongue.    . THYROID CYST 01/22/2011   Qualifier: Diagnosis of  By: Charlett Blake MD, Erline Levine    . Tinea corporis 06/07/2012  . Tinea corporis 02/22/2016  . Tongue cancer ([place])   . Tongue lesion 01/18/2014    Past Surgical History:  Procedure Laterality Date  . cervical fusion, discectomy, metal plate and 2 screws in place    . CHOLECYSTECTOMY    . EXCISION OF TONGUE LESION Left 07/11/2015   Procedure: EXCISION OF TONGUE LESION;  Surgeon: Melissa Montane, MD;  Location: [place];  Service: ENT;  Laterality: Left;  . KNEE ARTHROSCOPY     right  . RADICAL NECK DISSECTION  Left 07/11/2015   Procedure: RADICAL NECK DISSECTION;  Surgeon: Melissa Montane, MD;  Location: [facility] OR;  Service: ENT;  Laterality: Left;  Left neck dissection and left tongue resection  . THYROIDECTOMY, PARTIAL    . TUBAL LIGATION      There were no vitals filed for this visit.      Subjective Assessment - 10/19/17 1039    Subjective Pt states she is tight and stiff across her hips.  Reports 4/10 pain.  comes today with her rollator.     Currently in Pain? Yes   Pain Score 4    Pain Location Hip   Pain Orientation Right;Left   Pain Descriptors / Indicators Aching;Tightness   Pain Type Chronic pain                         [facility] Adult PT Treatment/Exercise - 10/19/17 0001      Lumbar Exercises: Standing   Shoulder Adduction Limitations  gait 20 feet with SPC, 150 feet with AWW     Lumbar Exercises: Seated   Long Arc Quad on Chair Both;15 reps   LAQ on Chair Weights (lbs) 2   LAQ on [place] Limitations scap retraction and shoulder ext with RTB x20 each on dyna disc; D2 Flexion PNF x15 reps and trunk rotations x15 reps each with RTB on dyna disc   Hip Flexion on Ball Limitations seated posterior pelvic tilts on dynadisc 15x10" holds   Sit to Stand Limitations x5 with mat table slightly elevated, BUE on table and then on thighs to complete   Other Seated Lumbar Exercises 3D thoracic excursions x5 reps each on dyna disc     Manual Therapy   Manual Therapy Soft tissue mobilization;Myofascial release   Manual therapy comments completed seperately from all other skilled interventions   Soft tissue mobilization prone to bilateral lumbar and glutes   Myofascial Release to decrease adhesions/spasms in lumbar and glute regions                  PT Short Term Goals - 09/29/17 1120      PT SHORT TERM GOAL #1   Title Pt will be independent with HEP and consistently perform in order to maximize return to PLOF.   Time 4   Period Weeks   Status Achieved     PT SHORT TERM GOAL #2   Title Pt will have improved knee extension AROM by 10 deg or > to demonstrate improved quad/hip flexor flexibility in order to maximize gait and decrease LBP.   Baseline 10/11: RLE knee ext lacking 8 deg from full extension, LLE lacking 17 deg; hip extension measured in prone: R and L lacking 15 deg from neutral at rest   Time 4   Period Weeks   Status Partially Met     PT SHORT TERM GOAL #3   Title Pt will have improved tolerance to standing and walking to 30 mins or > with 2 or fewer rest breaks required and 3/10 LBP or < to maximize her ability to perform chores for her mother   Baseline 9/13: standing and walking 45 mins without having to lean on the counter, pain averages about a 3/10 with standing a walking   Time 4   Period Weeks   Status  Achieved           PT Long Term Goals - 09/29/17 1120      PT LONG TERM GOAL #1  Title Pt will have improved MMT by 1 grade or > in all tested muscle groups to demonstrate improved strength, decrease pain, and maximize gait.   Baseline 10/11: MMT improved by 1/2 grade from inital eval   Time 8   Period Weeks   Status Partially Met     PT LONG TERM GOAL #2   Title Pt will have improved 5xSTS to 15 sec or < from chair with no UE support to demonstrate decreased pain and improved functional BLE strength.   Baseline 9/13: 28 sec with BUE   Time 8   Period Weeks   Status On-going     PT LONG TERM GOAL #3   Title Pt will have improved TUG with LRAD to 15 sec or < and with min to no gait deviations to demonstrate improved overall balance and function in order to promote participation at home and in the community.   Baseline 10/11: 51 sec with SPC, 26 sec with rollator   Time 8   Period Weeks   Status On-going     PT LONG TERM GOAL #4   Title Pt will be able to perform SLS on BLE for 10 sec or > with no UE support to improve balance and maximize gait.   Baseline 10/11: able to balance SLS on BLE for 10 sec with 1 UE    Time 8   Period Weeks   Status Partially Met     PT LONG TERM GOAL #5   Title Pt will have bil knee AROM to [facility] to demonstrate improved overall quad/hip flexor flexibility in order to decrease pain and maximize gait.   Time 8   Period Weeks   Status On-going     PT LONG TERM GOAL #6   Title Pt will have improved lumbar extension AROM to 50% limited or better to decrease pain and demonstrate improved flexibility in order to maximize gait and overall function.   Baseline 9/13: still significantly limited in extension   Time 8   Period Weeks   Status On-going               Plan - 10/19/17 1225    Clinical Impression Statement Pt reluctant to ambulate a further distance to the treatement room at beginning of session but agreeable at EOS.  Completed all  seated exercises with challenge to stability using dynadisc.  Able to increase reps without diffiuculty.  Pt ambulated 10 feet with SPC before wanting to turn and go back.  Continues to make progress with posture and overall functional mobility.  Pt requested therapist complete manual to sore stiff areas in lumbar and glute regions at EOS with noted tightness (Lt>Rt) and improved mobiltiy afterward.  Pt with some discomfort noted with posterior tilt but able to complete.    Rehab Potential Fair   PT Frequency 2x / week   PT Duration 4 weeks  correction from reassessment on 10/11 when pt was recerted 2x/week for 4 more weeks   PT Treatment/Interventions ADLs/Self Care Home Management;Cryotherapy;Electrical Stimulation;Moist Heat;DME Instruction;Gait training;Stair training;Functional mobility training;Therapeutic activities;Therapeutic exercise;Balance training;Neuromuscular re-education;Patient/family education;Manual techniques;Passive range of motion;Dry needling;Energy conservation;Taping   PT Next Visit Plan continue pelvic tilts on dyna disc, manual hip distraction, continue functional BLE strengthening, gait and balance training.  Progress ambulation ability with SPC and decrease reliance on UE's for standing.   PT Home Exercise Plan eval: supine hip flexor stretch  8/22:  3D hip excursions, supine bridge, pelvic tilts, clams; 8/29: prone quad stretch  with rope and pillows under hips; 9/13: supine frog stretch; 9/26: prone hip ext; 10/4: modified HS stretch to 90/90 10/11: seated lumbar flexion stretch; 10/17: seated pelvic tilts, SKTC, DKTC; 10/22: RFIS   Consulted and Agree with Plan of Care Patient      Patient will benefit from skilled therapeutic intervention in order to improve the following deficits and impairments:  Abnormal gait, Decreased activity tolerance, Decreased balance, Decreased endurance, Decreased mobility, Decreased range of motion, Decreased strength, Difficulty walking,  Hypomobility, Increased muscle spasms, Impaired flexibility, Improper body mechanics, Postural dysfunction, Pain  Visit Diagnosis: Chronic bilateral low back pain without sciatica  Difficulty in walking, not elsewhere classified  Muscle weakness (generalized)  Abnormal posture  Other symptoms and signs involving the musculoskeletal system     Problem List Patient Active Problem List   Diagnosis Date Noted  . Encephalopathy 07/20/2017  . Pulmonary nodules 06/14/2017  . Carotid artery disease ([place]) 06/13/2016  . Flank pain 02/22/2016  . Cancer of border of tongue ([place]) 07/11/2015  . Diabetes mellitus type 2 in obese ([place]) 06/29/2015  . Radiculopathy of leg 11/18/2014  . Medicare annual wellness visit, subsequent 04/14/2014  . Urine frequency 04/14/2014  . Cervical cancer screening 04/11/2014  . Obesity 01/20/2014  . 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 Breast cancer screening 01/18/2014  . Low back pain 10/04/2012  . CTS (carpal tunnel syndrome) 06/07/2012  . Tinea corporis 06/07/2012  . Insomnia 06/07/2012  . Knee pain, bilateral 10/16/2011  . Hypothyroidism 01/22/2011  . HTN (hypertension) 01/22/2011  . SLEEP APNEA, OBSTRUCTIVE 01/22/2011  . INTRINSIC ASTHMA, UNSPECIFIED 01/22/2011  . ARTHRITIS, CHRONIC 01/22/2011  . RHEUMATIC FEVER, HX OF 01/22/2011  . CHICKENPOX, HX OF 01/22/2011  . 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 THYROID CYST 01/22/2011  . Hyperlipidemia, mixed 01/22/2011  . ALLERGIC RHINITIS, SEASONAL 01/22/2011  . GERD 01/22/2011  . IRRITABLE BOWEL SYNDROME 01/22/2011  . Disorder of bone and cartilage 01/22/2011  . CARDIAC MURMUR, HX OF 01/22/2011   Teena Irani, PTA/CLT [phone]  Teena Irani 10/19/2017, 12:31 PM  [place], [place], [address] Phone: [phone]   Fax:  [phone]  Name: Amanda Carlson MRN: [id number] Date of Birth: 1946-11-25

## 2017-10-24 ENCOUNTER — Ambulatory Visit (HOSPITAL_COMMUNITY): Payer: Medicare Other | Attending: Family Medicine | Admitting: Physical Therapy

## 2017-10-24 DIAGNOSIS — G8929 Other chronic pain: Secondary | ICD-10-CM | POA: Diagnosis not present

## 2017-10-24 DIAGNOSIS — R262 Difficulty in walking, not elsewhere classified: Secondary | ICD-10-CM | POA: Diagnosis not present

## 2017-10-24 DIAGNOSIS — R293 Abnormal posture: Secondary | ICD-10-CM | POA: Diagnosis not present

## 2017-10-24 DIAGNOSIS — R29898 Other symptoms and signs involving the musculoskeletal system: Secondary | ICD-10-CM | POA: Diagnosis not present

## 2017-10-24 DIAGNOSIS — M545 Low back pain, unspecified: Secondary | ICD-10-CM

## 2017-10-24 DIAGNOSIS — M6281 Muscle weakness (generalized): Secondary | ICD-10-CM | POA: Diagnosis not present

## 2017-10-24 NOTE — Therapy (Signed)
[place], [place], [address] Phone: [phone]   Fax:  [phone]  Physical Therapy Treatment  Patient Details  Name: EMMERSEN GARRAWAY MRN: [id number] Date of Birth: [birth year] Referring Provider: Mosie Lukes, MD   Encounter Date: 10/24/2017  PT End of Session - 10/24/17 1654    Visit Number  22    Number of Visits  25    Date for PT Re-Evaluation  10/27/17    Authorization Type  Medicare Part A and B (gcodes done visit 15)    Authorization Time Period  08/04/17 to 09/29/17; NEW: 09/29/17 to 10/27/17    Authorization - Visit Number  37    Authorization - Number of Visits  25    PT Start Time  1119    PT Stop Time  1200    PT Time Calculation (min)  41 min    Equipment Utilized During Treatment  Cervical collar    Activity Tolerance  Patient tolerated treatment well;Patient limited by fatigue    Behavior During Therapy  [facility] for tasks assessed/performed       Past Medical History:  Diagnosis Date  . ALLERGIC RHINITIS, SEASONAL 01/22/2011   Qualifier: Diagnosis of  By: Charlett Blake MD, Erline Levine    . Arthritis   . Breast cancer screening 01/18/2014   Calcified Right side? Follows at [facility]   . Bronchitis    hx of  . Cervical cancer screening 04/11/2014  . CTS (carpal tunnel syndrome) 06/07/2012  . Diabetes mellitus type 2 in obese ([place]) 06/29/2015  . Diabetes mellitus without complication ([place])   . Difficulty swallowing    Liquids and foods at times  . Family history of adverse reaction to anesthesia    Pts mother has a difficult time waking up after anesthesia  . GERD 01/22/2011   Qualifier: Diagnosis of  By: Charlett Blake MD, Erline Levine    . HYPOTHYROIDISM 01/22/2011   Qualifier: Diagnosis of  By: Charlett Blake MD, Boyce Medici with Dr Chalmers Cater   . Insomnia 06/07/2012  . Intrinsic asthma, unspecified 01/22/2011   Qualifier: Diagnosis of  By: Charlett Blake MD, Erline Levine    . Irritable bowel syndrome 01/22/2011   Qualifier: Diagnosis of  By: Charlett Blake  MD, Erline Levine    . Knee pain, bilateral 10/16/2011  . Knee pain, right 10/16/2011  . Lesion of breast 01/18/2014   Calcified Right side? Follows at [place] [facility]  . Low back pain 10/04/2012  . Medicare annual wellness visit, subsequent 04/14/2014  . Numbness and tingling in hands   . Overweight(278.02) 01/22/2011   Qualifier: Diagnosis of  By: Charlett Blake MD, Erline Levine    . Pulmonary nodules 06/14/2017  . Rheumatic fever    as a child  . Shortness of breath dyspnea    with ambulation  . Sleep apnea    wears CPAP machine  . 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  SCC of tongue.    . THYROID CYST 01/22/2011   Qualifier: Diagnosis of  By: Charlett Blake MD, Erline Levine    . Tinea corporis 06/07/2012  . Tinea corporis 02/22/2016  . Tongue cancer ([place])   . Tongue lesion 01/18/2014    Past Surgical History:  Procedure Laterality Date  . cervical fusion, discectomy, metal plate and 2 screws in place    . CHOLECYSTECTOMY    . KNEE ARTHROSCOPY     right  . THYROIDECTOMY, PARTIAL    . TUBAL LIGATION  There were no vitals filed for this visit.  Subjective Assessment - 10/24/17 1659    Subjective  Pt states she is feeling good today wtihout pain.      Currently in Pain?  No/denies                      [facility] Adult PT Treatment/Exercise - 10/24/17 0001      Lumbar Exercises: Standing   Forward Lunge  10 reps;Limitations    Forward Lunge Limitations  onto 6" box with lightest UE assistance possible    Other Standing Lumbar Exercises  hip extension, hip abduction 10 reps each      Lumbar Exercises: Seated   Long Arc Quad on Chair  Both;15 reps    LAQ on Chair Weights (lbs)  3    LAQ on [place] Limitations  scap retraction and shoulder ext with GTB x15 each on dyna disc; D2 Flexion PNF x15 reps and trunk rotations x15 reps each with GTB on dyna disc    Hip Flexion on Ball Limitations  seated posterior pelvic tilts on dynadisc 15x10" holds    Sit to Stand  Limitations  x5 with mat table elevated to 21" and on BOSU, BUE on table and then HHA of therapist    Other Seated Lumbar Exercises  3D thoracic excursions x10 reps each on dyna disc               PT Short Term Goals - 09/29/17 1120      PT SHORT TERM GOAL #1   Title  Pt will be independent with HEP and consistently perform in order to maximize return to PLOF.    Time  4    Period  Weeks    Status  Achieved      PT SHORT TERM GOAL #2   Title  Pt will have improved knee extension AROM by 10 deg or > to demonstrate improved quad/hip flexor flexibility in order to maximize gait and decrease LBP.    Baseline  10/11: RLE knee ext lacking 8 deg from full extension, LLE lacking 17 deg; hip extension measured in prone: R and L lacking 15 deg from neutral at rest    Time  4    Period  Weeks    Status  Partially Met      PT SHORT TERM GOAL #3   Title  Pt will have improved tolerance to standing and walking to 30 mins or > with 2 or fewer rest breaks required and 3/10 LBP or < to maximize her ability to perform chores for her mother    Baseline  9/13: standing and walking 45 mins without having to lean on the counter, pain averages about a 3/10 with standing a walking    Time  4    Period  Weeks    Status  Achieved        PT Long Term Goals - 09/29/17 1120      PT LONG TERM GOAL #1   Title  Pt will have improved MMT by 1 grade or > in all tested muscle groups to demonstrate improved strength, decrease pain, and maximize gait.    Baseline  10/11: MMT improved by 1/2 grade from inital eval    Time  8    Period  Weeks    Status  Partially Met      PT LONG TERM GOAL #2   Title  Pt will have improved 5xSTS to 15 sec  or < from chair with no UE support to demonstrate decreased pain and improved functional BLE strength.    Baseline  9/13: 28 sec with BUE    Time  8    Period  Weeks    Status  On-going      PT LONG TERM GOAL #3   Title  Pt will have improved TUG with LRAD to 15 sec  or < and with min to no gait deviations to demonstrate improved overall balance and function in order to promote participation at home and in the community.    Baseline  10/11: 51 sec with SPC, 26 sec with rollator    Time  8    Period  Weeks    Status  On-going      PT LONG TERM GOAL #4   Title  Pt will be able to perform SLS on BLE for 10 sec or > with no UE support to improve balance and maximize gait.    Baseline  10/11: able to balance SLS on BLE for 10 sec with 1 UE     Time  8    Period  Weeks    Status  Partially Met      PT LONG TERM GOAL #5   Title  Pt will have bil knee AROM to [facility] to demonstrate improved overall quad/hip flexor flexibility in order to decrease pain and maximize gait.    Time  8    Period  Weeks    Status  On-going      PT LONG TERM GOAL #6   Title  Pt will have improved lumbar extension AROM to 50% limited or better to decrease pain and demonstrate improved flexibility in order to maximize gait and overall function.    Baseline  9/13: still significantly limited in extension    Time  8    Period  Weeks    Status  On-going            Plan - 10/24/17 1654    Clinical Impression Statement  Continued with exercise progression to include strengthening and stabilization.  Pt able to complete all seated exercises on dynadisc without UE assistance and no LOB.  Added hip strengthening in standing withi good tolerance and no rest breaks needed.  pt did require tactile cues for form..    Rehab Potential  Fair    PT Frequency  2x / week    PT Duration  4 weeks correction from reassessment on 44/96 when pt was recerted 2x/week for 4 more weeks   correction from reassessment on 75/91 when pt was recerted 2x/week for 4 more weeks   PT Treatment/Interventions  ADLs/Self Care Home Management;Cryotherapy;Electrical Stimulation;Moist Heat;DME Instruction;Gait training;Stair training;Functional mobility training;Therapeutic activities;Therapeutic exercise;Balance  training;Neuromuscular re-education;Patient/family education;Manual techniques;Passive range of motion;Dry needling;Energy conservation;Taping    PT Next Visit Plan  continue to progress strength and  balance training.  Progress ambulation ability with SPC and decrease reliance on UE's for standing.      PT Home Exercise Plan  eval: supine hip flexor stretch  8/22:  3D hip excursions, supine bridge, pelvic tilts, clams; 8/29: prone quad stretch with rope and pillows under hips; 9/13: supine frog stretch; 9/26: prone hip ext; 10/4: modified HS stretch to 90/90 10/11: seated lumbar flexion stretch; 10/17: seated pelvic tilts, SKTC, DKTC; 10/22: RFIS    Consulted and Agree with Plan of Care  Patient       Patient will benefit from skilled therapeutic intervention in order  to improve the following deficits and impairments:  Abnormal gait, Decreased activity tolerance, Decreased balance, Decreased endurance, Decreased mobility, Decreased range of motion, Decreased strength, Difficulty walking, Hypomobility, Increased muscle spasms, Impaired flexibility, Improper body mechanics, Postural dysfunction, Pain  Visit Diagnosis: Chronic bilateral low back pain without sciatica  Difficulty in walking, not elsewhere classified  Muscle weakness (generalized)  Abnormal posture  Other symptoms and signs involving the musculoskeletal system     Problem List Patient Active Problem List   Diagnosis Date Noted  . Encephalopathy 07/20/2017  . Pulmonary nodules 06/14/2017  . Carotid artery disease ([place]) 06/13/2016  . Flank pain 02/22/2016  . Cancer of border of tongue ([place]) 07/11/2015  . Diabetes mellitus type 2 in obese ([place]) 06/29/2015  . Radiculopathy of leg 11/18/2014  . Medicare annual wellness visit, subsequent 04/14/2014  . Urine frequency 04/14/2014  . Cervical cancer screening 04/11/2014  . Obesity 01/20/2014  . 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 Breast cancer screening  01/18/2014  . Low back pain 10/04/2012  . CTS (carpal tunnel syndrome) 06/07/2012  . Tinea corporis 06/07/2012  . Insomnia 06/07/2012  . Knee pain, bilateral 10/16/2011  . Hypothyroidism 01/22/2011  . HTN (hypertension) 01/22/2011  . SLEEP APNEA, OBSTRUCTIVE 01/22/2011  . INTRINSIC ASTHMA, UNSPECIFIED 01/22/2011  . ARTHRITIS, CHRONIC 01/22/2011  . RHEUMATIC FEVER, HX OF 01/22/2011  . CHICKENPOX, HX OF 01/22/2011  . 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 THYROID CYST 01/22/2011  . Hyperlipidemia, mixed 01/22/2011  . ALLERGIC RHINITIS, SEASONAL 01/22/2011  . GERD 01/22/2011  . IRRITABLE BOWEL SYNDROME 01/22/2011  . Disorder of bone and cartilage 01/22/2011  . CARDIAC MURMUR, HX OF 01/22/2011   Teena Irani, PTA/CLT [phone]  Teena Irani 10/24/2017, 5:01 PM  [place], [place], [address] Phone: [phone]   Fax:  [phone]  Name: CHANLEY MCENERY MRN: [id number] Date of Birth: Jan 20, 1946

## 2017-10-26 ENCOUNTER — Other Ambulatory Visit: Payer: Self-pay

## 2017-10-26 MED ORDER — BACLOFEN 20 MG PO TABS: ORAL_TABLET | ORAL | 1 refills | Status: DC

## 2017-10-27 ENCOUNTER — Ambulatory Visit (HOSPITAL_COMMUNITY): Payer: Medicare Other

## 2017-10-27 ENCOUNTER — Telehealth (HOSPITAL_COMMUNITY): Payer: Self-pay | Admitting: Family Medicine

## 2017-10-27 NOTE — Telephone Encounter (Signed)
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 appt was rescheduled

## 2017-10-31 ENCOUNTER — Encounter (HOSPITAL_COMMUNITY): Payer: Self-pay | Admitting: Physical Therapy

## 2017-10-31 ENCOUNTER — Other Ambulatory Visit: Payer: Self-pay

## 2017-10-31 ENCOUNTER — Ambulatory Visit (HOSPITAL_COMMUNITY): Payer: Medicare Other | Admitting: Physical Therapy

## 2017-10-31 DIAGNOSIS — R29898 Other symptoms and signs involving the musculoskeletal system: Secondary | ICD-10-CM

## 2017-10-31 DIAGNOSIS — R262 Difficulty in walking, not elsewhere classified: Secondary | ICD-10-CM

## 2017-10-31 DIAGNOSIS — Z23 Encounter for immunization: Secondary | ICD-10-CM | POA: Diagnosis not present

## 2017-10-31 DIAGNOSIS — M6281 Muscle weakness (generalized): Secondary | ICD-10-CM | POA: Diagnosis not present

## 2017-10-31 DIAGNOSIS — M545 Low back pain: Principal | ICD-10-CM

## 2017-10-31 DIAGNOSIS — G8929 Other chronic pain: Secondary | ICD-10-CM | POA: Diagnosis not present

## 2017-10-31 DIAGNOSIS — R293 Abnormal posture: Secondary | ICD-10-CM | POA: Diagnosis not present

## 2017-10-31 NOTE — Therapy (Signed)
[place], [place], [address] Phone: [phone]   Fax:  [phone]  Physical Therapy Treatment  Patient Details  Name: Amanda Carlson MRN: [id number] Date of Birth: 1946/08/07 Referring Provider: Mosie Lukes, MD   Encounter Date: 10/31/2017  PT End of Session - 10/31/17 1126    Visit Number  23    Number of Visits  25    Date for PT Re-Evaluation  10/27/17    Authorization Type  Medicare Part A and B (gcodes done visit 15)    Authorization Time Period  08/04/17 to 09/29/17; NEW: 09/29/17 to 10/27/17    Authorization - Visit Number  23    Authorization - Number of Visits  25    PT Start Time  1120    PT Stop Time  1200    PT Time Calculation (min)  40 min    Equipment Utilized During Treatment  Cervical collar    Activity Tolerance  Patient tolerated treatment well;Patient limited by fatigue    Behavior During Therapy  [facility] for tasks assessed/performed       Past Medical History:  Diagnosis Date  . ALLERGIC RHINITIS, SEASONAL 01/22/2011   Qualifier: Diagnosis of  By: Charlett Blake MD, Erline Levine    . Arthritis   . Breast cancer screening 01/18/2014   Calcified Right side? Follows at [place]   . Bronchitis    hx of  . Cervical cancer screening 04/11/2014  . CTS (carpal tunnel syndrome) 06/07/2012  . Diabetes mellitus type 2 in obese ([place]) 06/29/2015  . Diabetes mellitus without complication ([place])   . Difficulty swallowing    Liquids and foods at times  . Family history of adverse reaction to anesthesia    Pts mother has a difficult time waking up after anesthesia  . GERD 01/22/2011   Qualifier: Diagnosis of  By: Charlett Blake MD, Erline Levine    . HYPOTHYROIDISM 01/22/2011   Qualifier: Diagnosis of  By: Charlett Blake MD, Boyce Medici with Dr Chalmers Cater   . Insomnia 06/07/2012  . Intrinsic asthma, unspecified 01/22/2011   Qualifier: Diagnosis of  By: Charlett Blake MD, Erline Levine    . Irritable bowel syndrome 01/22/2011   Qualifier: Diagnosis of  By: Charlett Blake  MD, Erline Levine    . Knee pain, bilateral 10/16/2011  . Knee pain, right 10/16/2011  . Lesion of breast 01/18/2014   Calcified Right side? Follows at [place]  . Low back pain 10/04/2012  . Medicare annual wellness visit, subsequent 04/14/2014  . Numbness and tingling in hands   . Overweight(278.02) 01/22/2011   Qualifier: Diagnosis of  By: Charlett Blake MD, Erline Levine    . Pulmonary nodules 06/14/2017  . Rheumatic fever    as a child  . Shortness of breath dyspnea    with ambulation  . Sleep apnea    wears CPAP machine  . 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  SCC of tongue.    . THYROID CYST 01/22/2011   Qualifier: Diagnosis of  By: Charlett Blake MD, Erline Levine    . Tinea corporis 06/07/2012  . Tinea corporis 02/22/2016  . Tongue cancer ([place])   . Tongue lesion 01/18/2014    Past Surgical History:  Procedure Laterality Date  . cervical fusion, discectomy, metal plate and 2 screws in place    . CHOLECYSTECTOMY    . KNEE ARTHROSCOPY     right  . THYROIDECTOMY, PARTIAL    . TUBAL LIGATION  There were no vitals filed for this visit.  Subjective Assessment - 10/31/17 1121    Subjective  Pt states she is feeling good today wtihout pain just stiff     Currently in Pain?  No/denies         [facility] PT Assessment - 10/31/17 0001      Assessment   Medical Diagnosis  LBP    Referring Provider  Mosie Lukes, MD    Onset Date/Surgical Date  -- LBP for years, most recent bout going on for a few weeks    Next MD Visit  --    Prior Therapy  yes for current issue about 2 years ago      Precautions   Precautions  None      Restrictions   Weight Bearing Restrictions  No      [place] residence      Prior Function   Level of [place]  Retired    Leisure  read      Observation/Other Assessments   Focus on Therapeutic Outcomes (FOTO)   74% limitation      Sensation   Light Touch   Appears Intact      Posture/Postural Control   Posture/Postural Control  Postural limitations    Postural Limitations  Anterior pelvic tilt;Decreased thoracic kyphosis;Flexed trunk;Decreased lumbar lordosis      AROM   Overall AROM Comments  L hip IR and ER significant limited and painful; R hip IR slightly limited, ER WNL    Right Hip Extension  -- -15 AROM and at rest    Left Hip Extension  -11 AROM; at rest -15 deg from neutral    Right Knee Extension  -10    Left Knee Extension  -17    Lumbar Flexion  50% limited    Lumbar Extension  90% limited, very minimal lumbar extension    Lumbar - Right Side Bend  WNL    Lumbar - Left Side Bend  WNL    Lumbar - Right Rotation  WNL    Lumbar - Left Rotation  WNL      Strength   Overall Strength Comments  significant weakness of glutes as demo by difficulty with sit <> stands, but unable to objectively assess as pt unable to lie prone due to pain    Right Hip Flexion  5/5 was 4+    Right Hip Extension  4-/5    Right Hip ABduction  4/5 was 3+    Left Hip Flexion  4+/5 was 4    Left Hip Extension  4-/5    Left Hip ABduction  4-/5    Right Knee Flexion  4+/5 was 4+    Right Knee Extension  4+/5 was 4+    Left Knee Flexion  4+/5 was 4+    Left Knee Extension  4+/5 was 4+    Right Ankle Dorsiflexion  5/5    Left Ankle Dorsiflexion  5/5      Flexibility   Soft Tissue Assessment /Muscle Length  yes    Hamstrings  BLE WNL    Quadriceps  Tight BLE based on standing posture as she has significant anterior pelvic tilt; and pt unable to fully straighten legs in supine    Piriformis  L significantly tight; R min to mod limitations      Palpation   Spinal mobility  not assessed    Palpation comment  (  performed in sidelying) multiple large palpable trigger points in bil lumbar paraspinals and glutes, L>R, tender to palpation throughout; increased restrictions of quads, adductors as well and tender to palpation      Ambulation/Gait   Ambulation  Distance (Feet)  30 Feet around gym    Assistive device  Straight cane    Gait Pattern  Decreased stride length;Trendelenburg;Antalgic;Trunk flexed max limitations in hip ext    Gait Comments  max limitations in gait noted; using wall for steadiness      Balance   Balance Assessed  Yes      Static Standing Balance   Static Standing - Balance Support  No upper extremity supported    Static Standing Balance -  Activities   Single Leg Stance - Right Leg;Single Leg Stance - Left Leg      Standardized Balance Assessment   Standardized Balance Assessment  Five Times Sit to Stand;Timed Up and Go Test    Five times sit to stand comments   28 sec from chair, BUE support       Timed Up and Go Test   TUG  Normal TUG    Normal TUG (seconds)  24 rollator; 55 sec with SPC                  [facility] Adult PT Treatment/Exercise - 10/31/17 0001      Lumbar Exercises: Stretches   Active Hamstring Stretch  3 reps;20 seconds    Lower Trunk Rotation  5 reps;30 seconds      Lumbar Exercises: Standing   Forward Lunge  5 reps    Other Standing Lumbar Exercises  hip extension, hip abduction 10 reps each    Other Standing Lumbar Exercises  side step at table x 1 RT       Lumbar Exercises: Seated   Sit to Stand Limitations  x5 with mat table slightly elevated, BUE on table and then on thighs to complete    Other Seated Lumbar Exercises  sit as tall as possible pusing with feet and lifting with head x 10       Lumbar Exercises: Supine   Bridge  15 reps    Other Supine Lumbar Exercises  decompression 1-5               PT Short Term Goals - 09/29/17 1120      PT SHORT TERM GOAL #1   Title  Pt will be independent with HEP and consistently perform in order to maximize return to PLOF.    Time  4    Period  Weeks    Status  Achieved      PT SHORT TERM GOAL #2   Title  Pt will have improved knee extension AROM by 10 deg or > to demonstrate improved quad/hip flexor flexibility in order  to maximize gait and decrease LBP.    Baseline  10/11: RLE knee ext lacking 8 deg from full extension, LLE lacking 17 deg; hip extension measured in prone: R and L lacking 15 deg from neutral at rest    Time  4    Period  Weeks    Status  Partially Met      PT SHORT TERM GOAL #3   Title  Pt will have improved tolerance to standing and walking to 30 mins or > with 2 or fewer rest breaks required and 3/10 LBP or < to maximize her ability to perform chores for her mother    Baseline  9/13: standing and walking 45 mins without having to lean on the counter, pain averages about a 3/10 with standing a walking    Time  4    Period  Weeks    Status  Achieved        PT Long Term Goals - 09/29/17 1120      PT LONG TERM GOAL #1   Title  Pt will have improved MMT by 1 grade or > in all tested muscle groups to demonstrate improved strength, decrease pain, and maximize gait.    Baseline  10/11: MMT improved by 1/2 grade from inital eval    Time  8    Period  Weeks    Status  Partially Met      PT LONG TERM GOAL #2   Title  Pt will have improved 5xSTS to 15 sec or < from chair with no UE support to demonstrate decreased pain and improved functional BLE strength.    Baseline  9/13: 28 sec with BUE    Time  8    Period  Weeks    Status  On-going      PT LONG TERM GOAL #3   Title  Pt will have improved TUG with LRAD to 15 sec or < and with min to no gait deviations to demonstrate improved overall balance and function in order to promote participation at home and in the community.    Baseline  10/11: 51 sec with SPC, 26 sec with rollator    Time  8    Period  Weeks    Status  On-going      PT LONG TERM GOAL #4   Title  Pt will be able to perform SLS on BLE for 10 sec or > with no UE support to improve balance and maximize gait.    Baseline  10/11: able to balance SLS on BLE for 10 sec with 1 UE     Time  8    Period  Weeks    Status  Partially Met      PT LONG TERM GOAL #5   Title  Pt  will have bil knee AROM to [facility] to demonstrate improved overall quad/hip flexor flexibility in order to decrease pain and maximize gait.    Time  8    Period  Weeks    Status  On-going      PT LONG TERM GOAL #6   Title  Pt will have improved lumbar extension AROM to 50% limited or better to decrease pain and demonstrate improved flexibility in order to maximize gait and overall function.    Baseline  9/13: still significantly limited in extension    Time  8    Period  Weeks    Status  On-going            Plan - 10/31/17 1201    Clinical Impression Statement  Began session with decompression and stretches to address stffness.  PT stil has significant weakness and decreased mobility in LE>  Pt has one more session and then will be reassessed on progress     Rehab Potential  Fair    PT Frequency  2x / week    PT Duration  4 weeks correction from reassessment on 08/67 when pt was recerted 2x/week for 4 more weeks    PT Treatment/Interventions  ADLs/Self Care Home Management;Cryotherapy;Electrical Stimulation;Moist Heat;DME Instruction;Gait training;Stair training;Functional mobility training;Therapeutic activities;Therapeutic exercise;Balance training;Neuromuscular re-education;Patient/family education;Manual techniques;Passive range of motion;Dry needling;Energy conservation;Taping  PT Next Visit Plan  reassess in two more visits.     PT Home Exercise Plan  eval: supine hip flexor stretch  8/22:  3D hip excursions, supine bridge, pelvic tilts, clams; 8/29: prone quad stretch with rope and pillows under hips; 9/13: supine frog stretch; 9/26: prone hip ext; 10/4: modified HS stretch to 90/90 10/11: seated lumbar flexion stretch; 10/17: seated pelvic tilts, SKTC, DKTC; 10/22: RFIS    Consulted and Agree with Plan of Care  Patient       Patient will benefit from skilled therapeutic intervention in order to improve the following deficits and impairments:  Abnormal gait, Decreased activity  tolerance, Decreased balance, Decreased endurance, Decreased mobility, Decreased range of motion, Decreased strength, Difficulty walking, Hypomobility, Increased muscle spasms, Impaired flexibility, Improper body mechanics, Postural dysfunction, Pain  Visit Diagnosis: Chronic bilateral low back pain without sciatica  Difficulty in walking, not elsewhere classified  Muscle weakness (generalized)  Abnormal posture  Other symptoms and signs involving the musculoskeletal system     Problem List Patient Active Problem List   Diagnosis Date Noted  . Encephalopathy 07/20/2017  . Pulmonary nodules 06/14/2017  . Carotid artery disease ([place]) 06/13/2016  . Flank pain 02/22/2016  . Cancer of border of tongue ([place]) 07/11/2015  . Diabetes mellitus type 2 in obese ([place]) 06/29/2015  . Radiculopathy of leg 11/18/2014  . Medicare annual wellness visit, subsequent 04/14/2014  . Urine frequency 04/14/2014  . Cervical cancer screening 04/11/2014  . Obesity 01/20/2014  . 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 Breast cancer screening 01/18/2014  . Low back pain 10/04/2012  . CTS (carpal tunnel syndrome) 06/07/2012  . Tinea corporis 06/07/2012  . Insomnia 06/07/2012  . Knee pain, bilateral 10/16/2011  . Hypothyroidism 01/22/2011  . HTN (hypertension) 01/22/2011  . SLEEP APNEA, OBSTRUCTIVE 01/22/2011  . INTRINSIC ASTHMA, UNSPECIFIED 01/22/2011  . ARTHRITIS, CHRONIC 01/22/2011  . RHEUMATIC FEVER, HX OF 01/22/2011  . CHICKENPOX, HX OF 01/22/2011  . 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 THYROID CYST 01/22/2011  . Hyperlipidemia, mixed 01/22/2011  . ALLERGIC RHINITIS, SEASONAL 01/22/2011  . GERD 01/22/2011  . IRRITABLE BOWEL SYNDROME 01/22/2011  . Disorder of bone and cartilage 01/22/2011  . CARDIAC MURMUR, HX OF 01/22/2011   Rayetta Humphrey, PT CLT [phone] 10/31/2017, 12:06 PM  [place] [address] [place], [place],  [address] Phone: [phone]   Fax:  [phone]  Name: Amanda Carlson MRN: [id number] Date of Birth: [birth year]

## 2017-11-01 ENCOUNTER — Ambulatory Visit (HOSPITAL_COMMUNITY): Payer: Medicare Other

## 2017-11-01 ENCOUNTER — Encounter (HOSPITAL_COMMUNITY): Payer: Self-pay

## 2017-11-01 DIAGNOSIS — M545 Low back pain, unspecified: Secondary | ICD-10-CM

## 2017-11-01 DIAGNOSIS — R262 Difficulty in walking, not elsewhere classified: Secondary | ICD-10-CM

## 2017-11-01 DIAGNOSIS — R29898 Other symptoms and signs involving the musculoskeletal system: Secondary | ICD-10-CM | POA: Diagnosis not present

## 2017-11-01 DIAGNOSIS — M6281 Muscle weakness (generalized): Secondary | ICD-10-CM | POA: Diagnosis not present

## 2017-11-01 DIAGNOSIS — G8929 Other chronic pain: Secondary | ICD-10-CM

## 2017-11-01 DIAGNOSIS — R293 Abnormal posture: Secondary | ICD-10-CM

## 2017-11-01 NOTE — Therapy (Signed)
[place], [place], [address] Phone: [phone]   Fax:  [phone]  Physical Therapy Treatment  Patient Details  Name: Amanda Carlson MRN: [id number] Date of Birth: 16-Sep-1946 Referring Provider: Mosie Lukes, MD   Encounter Date: 11/01/2017  PT End of Session - 11/01/17 1031    Visit Number  14    Number of Visits  25    Date for PT Re-Evaluation  11/08/17    Authorization Type  Medicare Part A and B (gcodes done visit 15)    Authorization Time Period  08/04/17 to 09/29/17; NEW: 09/29/17 to 10/27/17; 11/02/17 to 11/08/17    Authorization - Visit Number  24    Authorization - Number of Visits  25    PT Start Time  1030    PT Stop Time  1111    PT Time Calculation (min)  41 min    Equipment Utilized During Treatment  Cervical collar    Activity Tolerance  Patient tolerated treatment well;Patient limited by fatigue    Behavior During Therapy  [facility] for tasks assessed/performed       Past Medical History:  Diagnosis Date  . ALLERGIC RHINITIS, SEASONAL 01/22/2011   Qualifier: Diagnosis of  By: Charlett Blake MD, Erline Levine    . Arthritis   . Breast cancer screening 01/18/2014   Calcified Right side? Follows at [facility]   . Bronchitis    hx of  . Cervical cancer screening 04/11/2014  . CTS (carpal tunnel syndrome) 06/07/2012  . Diabetes mellitus type 2 in obese ([place]) 06/29/2015  . Diabetes mellitus without complication ([place])   . Difficulty swallowing    Liquids and foods at times  . Family history of adverse reaction to anesthesia    Pts mother has a difficult time waking up after anesthesia  . GERD 01/22/2011   Qualifier: Diagnosis of  By: Charlett Blake MD, Erline Levine    . HYPOTHYROIDISM 01/22/2011   Qualifier: Diagnosis of  By: Charlett Blake MD, Boyce Medici with Dr Chalmers Cater   . Insomnia 06/07/2012  . Intrinsic asthma, unspecified 01/22/2011   Qualifier: Diagnosis of  By: Charlett Blake MD, Erline Levine    . Irritable bowel syndrome 01/22/2011   Qualifier:  Diagnosis of  By: Charlett Blake MD, Erline Levine    . Knee pain, bilateral 10/16/2011  . Knee pain, right 10/16/2011  . Lesion of breast 01/18/2014   Calcified Right side? Follows at [facility]  . Low back pain 10/04/2012  . Medicare annual wellness visit, subsequent 04/14/2014  . Numbness and tingling in hands   . Overweight(278.02) 01/22/2011   Qualifier: Diagnosis of  By: Charlett Blake MD, Erline Levine    . Pulmonary nodules 06/14/2017  . Rheumatic fever    as a child  . Shortness of breath dyspnea    with ambulation  . Sleep apnea    wears CPAP machine  . 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  SCC of tongue.    . THYROID CYST 01/22/2011   Qualifier: Diagnosis of  By: Charlett Blake MD, Erline Levine    . Tinea corporis 06/07/2012  . Tinea corporis 02/22/2016  . Tongue cancer ([place])   . Tongue lesion 01/18/2014    Past Surgical History:  Procedure Laterality Date  . cervical fusion, discectomy, metal plate and 2 screws in place    . CHOLECYSTECTOMY    . KNEE ARTHROSCOPY     right  . THYROIDECTOMY, PARTIAL    . TUBAL  LIGATION      There were no vitals filed for this visit.  Subjective Assessment - 11/01/17 1031    Subjective  Pt denies any pain, just stiffness again. She is having increased stress with both of her parents health issues.    Currently in Pain?  No/denies           [facility] Adult PT Treatment/Exercise - 11/01/17 0001      Lumbar Exercises: Stretches   Active Hamstring Stretch  3 reps;30 seconds    Active Hamstring Stretch Limitations  supine with rope    Lower Trunk Rotation Limitations  10x10" each      Lumbar Exercises: Standing   Forward Lunge  10 reps    Forward Lunge Limitations  2 sets; onto 6" box with lightest UE assistance possible    Other Standing Lumbar Exercises  retro walking in // bars x2RT with bil 1# ankle weights on    Other Standing Lumbar Exercises  sidestepping in // bars x2RT with bil 1# ankle weights      Lumbar Exercises:  Seated   Other Seated Lumbar Exercises  w back and x to v while sitting on dynadisc (feet elevated/not touching the floor) x15 reps each      Lumbar Exercises: Supine   Other Supine Lumbar Exercises  decompression 1-5 x 5 reps each (added to HEP)            PT Education - 11/01/17 1037    Education provided  Yes    Education Details  will reassess next visit; added decompression 1-5 to HEP    Person(s) Educated  Patient    Methods  Explanation;Demonstration;Handout    Comprehension  Verbalized understanding;Returned demonstration       PT Short Term Goals - 09/29/17 1120      PT SHORT TERM GOAL #1   Title  Pt will be independent with HEP and consistently perform in order to maximize return to PLOF.    Time  4    Period  Weeks    Status  Achieved      PT SHORT TERM GOAL #2   Title  Pt will have improved knee extension AROM by 10 deg or > to demonstrate improved quad/hip flexor flexibility in order to maximize gait and decrease LBP.    Baseline  10/11: RLE knee ext lacking 8 deg from full extension, LLE lacking 17 deg; hip extension measured in prone: R and L lacking 15 deg from neutral at rest    Time  4    Period  Weeks    Status  Partially Met      PT SHORT TERM GOAL #3   Title  Pt will have improved tolerance to standing and walking to 30 mins or > with 2 or fewer rest breaks required and 3/10 LBP or < to maximize her ability to perform chores for her mother    Baseline  9/13: standing and walking 45 mins without having to lean on the counter, pain averages about a 3/10 with standing a walking    Time  4    Period  Weeks    Status  Achieved        PT Long Term Goals - 09/29/17 1120      PT LONG TERM GOAL #1   Title  Pt will have improved MMT by 1 grade or > in all tested muscle groups to demonstrate improved strength, decrease pain, and maximize gait.    Baseline  10/11: MMT improved by 1/2 grade from inital eval    Time  8    Period  Weeks    Status  Partially  Met      PT LONG TERM GOAL #2   Title  Pt will have improved 5xSTS to 15 sec or < from chair with no UE support to demonstrate decreased pain and improved functional BLE strength.    Baseline  9/13: 28 sec with BUE    Time  8    Period  Weeks    Status  On-going      PT LONG TERM GOAL #3   Title  Pt will have improved TUG with LRAD to 15 sec or < and with min to no gait deviations to demonstrate improved overall balance and function in order to promote participation at home and in the community.    Baseline  10/11: 51 sec with SPC, 26 sec with rollator    Time  8    Period  Weeks    Status  On-going      PT LONG TERM GOAL #4   Title  Pt will be able to perform SLS on BLE for 10 sec or > with no UE support to improve balance and maximize gait.    Baseline  10/11: able to balance SLS on BLE for 10 sec with 1 UE     Time  8    Period  Weeks    Status  Partially Met      PT LONG TERM GOAL #5   Title  Pt will have bil knee AROM to [place] to demonstrate improved overall quad/hip flexor flexibility in order to decrease pain and maximize gait.    Time  8    Period  Weeks    Status  On-going      PT LONG TERM GOAL #6   Title  Pt will have improved lumbar extension AROM to 50% limited or better to decrease pain and demonstrate improved flexibility in order to maximize gait and overall function.    Baseline  9/13: still significantly limited in extension    Time  8    Period  Weeks    Status  On-going            Plan - 11/01/17 1212    Clinical Impression Statement  Flexibility, core strengthening, and functional BLE strengthening focus of today's session. Pt tolerated well with reports of decreased stiffness at EOS. Pt continues to rely heavily on her UE for gait and balance, and she is unable to lessen her BUE support even with verbal and tactile cueing. Pt due for reassessment next visit.    Rehab Potential  Fair    PT Frequency  2x / week    PT Duration  4 weeks correction from  reassessment on 60/73 when pt was recerted 2x/week for 4 more weeks    PT Treatment/Interventions  ADLs/Self Care Home Management;Cryotherapy;Electrical Stimulation;Moist Heat;DME Instruction;Gait training;Stair training;Functional mobility training;Therapeutic activities;Therapeutic exercise;Balance training;Neuromuscular re-education;Patient/family education;Manual techniques;Passive range of motion;Dry needling;Energy conservation;Taping    PT Next Visit Plan  reassess    PT Home Exercise Plan  eval: supine hip flexor stretch  8/22:  3D hip excursions, supine bridge, pelvic tilts, clams; 8/29: prone quad stretch with rope and pillows under hips; 9/13: supine frog stretch; 9/26: prone hip ext; 10/4: modified HS stretch to 90/90 10/11: seated lumbar flexion stretch; 10/17: seated pelvic tilts, SKTC, DKTC; 10/22: RFIS; 11/13: decompression 1-5    Consulted  and Agree with Plan of Care  Patient       Patient will benefit from skilled therapeutic intervention in order to improve the following deficits and impairments:  Abnormal gait, Decreased activity tolerance, Decreased balance, Decreased endurance, Decreased mobility, Decreased range of motion, Decreased strength, Difficulty walking, Hypomobility, Increased muscle spasms, Impaired flexibility, Improper body mechanics, Postural dysfunction, Pain  Visit Diagnosis: Chronic bilateral low back pain without sciatica  Difficulty in walking, not elsewhere classified  Muscle weakness (generalized)  Abnormal posture  Other symptoms and signs involving the musculoskeletal system     Problem List Patient Active Problem List   Diagnosis Date Noted  . Encephalopathy 07/20/2017  . Pulmonary nodules 06/14/2017  . Carotid artery disease ([place]) 06/13/2016  . Flank pain 02/22/2016  . Cancer of border of tongue ([place]) 07/11/2015  . Diabetes mellitus type 2 in obese ([place]) 06/29/2015  . Radiculopathy of leg 11/18/2014  . Medicare annual wellness visit,  subsequent 04/14/2014  . Urine frequency 04/14/2014  . Cervical cancer screening 04/11/2014  . Obesity 01/20/2014  . 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 Breast cancer screening 01/18/2014  . Low back pain 10/04/2012  . CTS (carpal tunnel syndrome) 06/07/2012  . Tinea corporis 06/07/2012  . Insomnia 06/07/2012  . Knee pain, bilateral 10/16/2011  . Hypothyroidism 01/22/2011  . HTN (hypertension) 01/22/2011  . SLEEP APNEA, OBSTRUCTIVE 01/22/2011  . INTRINSIC ASTHMA, UNSPECIFIED 01/22/2011  . ARTHRITIS, CHRONIC 01/22/2011  . RHEUMATIC FEVER, HX OF 01/22/2011  . CHICKENPOX, HX OF 01/22/2011  . 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 THYROID CYST 01/22/2011  . Hyperlipidemia, mixed 01/22/2011  . ALLERGIC RHINITIS, SEASONAL 01/22/2011  . GERD 01/22/2011  . IRRITABLE BOWEL SYNDROME 01/22/2011  . Disorder of bone and cartilage 01/22/2011  . CARDIAC MURMUR, HX OF 01/22/2011       Geraldine Solar PT, DPT  [place] [address] [place], [place], [address] Phone: [phone]   Fax:  [phone]  Name: Amanda Carlson MRN: [id number] Date of Birth: 1946-11-13

## 2017-11-09 ENCOUNTER — Telehealth (HOSPITAL_COMMUNITY): Payer: Self-pay | Admitting: Family Medicine

## 2017-11-09 ENCOUNTER — Ambulatory Visit (HOSPITAL_COMMUNITY): Payer: Medicare Other

## 2017-11-09 NOTE — Telephone Encounter (Signed)
11/09/17  Pt cx said that she was sick

## 2017-11-14 ENCOUNTER — Other Ambulatory Visit: Payer: Self-pay | Admitting: Family Medicine

## 2017-11-14 ENCOUNTER — Telehealth: Payer: Self-pay | Admitting: Family Medicine

## 2017-11-14 NOTE — Telephone Encounter (Signed)
Pt requesting refill on glucose test strips and lancets only. I don't see where they were ordered. Please advise. Thanks.

## 2017-11-14 NOTE — Telephone Encounter (Signed)
Copied from [place]. Topic: Quick Communication - See Telephone Encounter >> Nov 14, 2017  8:42 AM Robina Ade, Helene Kelp D wrote: CRM for notification. See Telephone encounter for: 11/14/17. Patient needs refill on her furosemide (LASIX) 20 MG tablet and her diabetic test strips to her pharmacy [id number] - [place], [place] - [address].

## 2017-11-15 ENCOUNTER — Ambulatory Visit (HOSPITAL_COMMUNITY): Payer: Medicare Other

## 2017-11-15 ENCOUNTER — Encounter (HOSPITAL_COMMUNITY): Payer: Self-pay

## 2017-11-15 DIAGNOSIS — G8929 Other chronic pain: Secondary | ICD-10-CM

## 2017-11-15 DIAGNOSIS — M6281 Muscle weakness (generalized): Secondary | ICD-10-CM | POA: Diagnosis not present

## 2017-11-15 DIAGNOSIS — R262 Difficulty in walking, not elsewhere classified: Secondary | ICD-10-CM | POA: Diagnosis not present

## 2017-11-15 DIAGNOSIS — R293 Abnormal posture: Secondary | ICD-10-CM

## 2017-11-15 DIAGNOSIS — M545 Low back pain: Secondary | ICD-10-CM | POA: Diagnosis not present

## 2017-11-15 DIAGNOSIS — R29898 Other symptoms and signs involving the musculoskeletal system: Secondary | ICD-10-CM

## 2017-11-15 NOTE — Telephone Encounter (Signed)
Pt called in to follow up on request she said that she would like to pick up today if possible.    Please assist and advise further.

## 2017-11-15 NOTE — Therapy (Signed)
[place], [place], [address] Phone: [phone]   Fax:  [phone]  Physical Therapy Treatment/Discharge Summary  Patient Details  Name: Amanda Carlson MRN: [id number] Date of Birth: 08-29-1946 Referring Provider: Mosie Lukes, MD   Encounter Date: 11/15/2017  PT End of Session - 11/15/17 1101    Visit Number  25    Number of Visits  25    Date for PT Re-Evaluation  11/08/17    Authorization Type  Medicare Part A and B (gcodes done visit 25)    Authorization Time Period  08/04/17 to 09/29/17; NEW: 09/29/17 to 10/27/17; 11/02/17 to 11/08/17    Authorization - Visit Number  25    Authorization - Number of Visits  25    PT Start Time  1103    PT Stop Time  1138    PT Time Calculation (min)  35 min    Equipment Utilized During Treatment  Cervical collar    Activity Tolerance  Patient tolerated treatment well;Patient limited by fatigue    Behavior During Therapy  [facility] for tasks assessed/performed       Past Medical History:  Diagnosis Date  . ALLERGIC RHINITIS, SEASONAL 01/22/2011   Qualifier: Diagnosis of  By: Charlett Blake MD, Erline Levine    . Arthritis   . Breast cancer screening 01/18/2014   Calcified Right side? Follows at [facility]   . Bronchitis    hx of  . Cervical cancer screening 04/11/2014  . CTS (carpal tunnel syndrome) 06/07/2012  . Diabetes mellitus type 2 in obese ([place]) 06/29/2015  . Diabetes mellitus without complication ([place])   . Difficulty swallowing    Liquids and foods at times  . Family history of adverse reaction to anesthesia    Pts mother has a difficult time waking up after anesthesia  . GERD 01/22/2011   Qualifier: Diagnosis of  By: Charlett Blake MD, Erline Levine    . HYPOTHYROIDISM 01/22/2011   Qualifier: Diagnosis of  By: Charlett Blake MD, Boyce Medici with Dr Chalmers Cater   . Insomnia 06/07/2012  . Intrinsic asthma, unspecified 01/22/2011   Qualifier: Diagnosis of  By: Charlett Blake MD, Erline Levine    . Irritable bowel syndrome 01/22/2011    Qualifier: Diagnosis of  By: Charlett Blake MD, Erline Levine    . Knee pain, bilateral 10/16/2011  . Knee pain, right 10/16/2011  . Lesion of breast 01/18/2014   Calcified Right side? Follows at [facility]  . Low back pain 10/04/2012  . Medicare annual wellness visit, subsequent 04/14/2014  . Numbness and tingling in hands   . Overweight(278.02) 01/22/2011   Qualifier: Diagnosis of  By: Charlett Blake MD, Erline Levine    . Pulmonary nodules 06/14/2017  . Rheumatic fever    as a child  . Shortness of breath dyspnea    with ambulation  . Sleep apnea    wears CPAP machine  . 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  SCC of tongue.    . THYROID CYST 01/22/2011   Qualifier: Diagnosis of  By: Charlett Blake MD, Erline Levine    . Tinea corporis 06/07/2012  . Tinea corporis 02/22/2016  . Tongue cancer ([place])   . Tongue lesion 01/18/2014    Past Surgical History:  Procedure Laterality Date  . cervical fusion, discectomy, metal plate and 2 screws in place    . CHOLECYSTECTOMY    . EXCISION OF TONGUE LESION Left 07/11/2015   Procedure: EXCISION OF TONGUE LESION;  Surgeon:  Melissa Montane, MD;  Location: [facility];  Service: ENT;  Laterality: Left;  . KNEE ARTHROSCOPY     right  . RADICAL NECK DISSECTION Left 07/11/2015   Procedure: RADICAL NECK DISSECTION;  Surgeon: Melissa Montane, MD;  Location: [facility];  Service: ENT;  Laterality: Left;  Left neck dissection and left tongue resection  . THYROIDECTOMY, PARTIAL    . TUBAL LIGATION      There were no vitals filed for this visit.  Subjective Assessment - 11/15/17 1102    Subjective  Pt states that she is feeling about the same since her last session.    Currently in Pain?  No/denies         [facility] PT Assessment - 11/15/17 0001      AROM   Overall AROM Comments  R knee ext -12 from neutral; L knee ext -15 deg from neutral    Left Hip Extension  -15 at rest and active    Right Knee Extension  -13 active, -15 at rest    Lumbar Flexion  25% limited     Lumbar Extension  pt stuck in lordosis AEB standing posture and increased extension to compensate for anteriorly rotated pelvis      Strength   Right Hip Extension  4-/5 was 4-    Right Hip ABduction  4/5 was 4    Left Hip Flexion  4+/5 was 4+    Left Hip Extension  4-/5 was 4-    Left Hip ABduction  4-/5 was 4-    Right Knee Flexion  4+/5 was 4+    Right Knee Extension  5/5 was 4+    Left Knee Flexion  5/5 was 4+    Left Knee Extension  4+/5 was 4+      Balance   Balance Assessed  Yes      Static Standing Balance   Static Standing - Balance Support  Left upper extremity supported    Static Standing Balance -  Activities   Single Leg Stance - Right Leg;Single Leg Stance - Left Leg    Static Standing - Comment/# of Minutes  10 sec BLE with 1 UE support      Standardized Balance Assessment   Standardized Balance Assessment  Five Times Sit to Stand;Timed Up and Go Test    Five times sit to stand comments   33 sec from chair, BUE support      Timed Up and Go Test   TUG  Normal TUG    Normal TUG (seconds)  24 rollator            [facility] Adult PT Treatment/Exercise - 11/15/17 0001      Lumbar Exercises: Aerobic   Stationary Bike  Nustep x5 mins at beginning of session for w/u Lysle Morales)           PT Education - 11/15/17 1106    Education provided  Yes    Education Details  discharge plans    Person(s) Educated  Patient    Methods  Explanation    Comprehension  Verbalized understanding       PT Short Term Goals - 11/15/17 1110      PT SHORT TERM GOAL #1   Title  Pt will be independent with HEP and consistently perform in order to maximize return to PLOF.    Time  4    Period  Weeks    Status  Achieved      PT SHORT TERM GOAL #2  Title  Pt will have improved knee extension AROM by 10 deg or > to demonstrate improved quad/hip flexor flexibility in order to maximize gait and decrease LBP.    Baseline  11/27: RLE knee ext lacking 12 deg from full extension, LLE  lacking 15 deg (was 42 on initial eval); hip extension measured in prone: R and L lacking 15 deg from neutral at rest    Time  4    Period  Weeks    Status  Partially Met      PT SHORT TERM GOAL #3   Title  Pt will have improved tolerance to standing and walking to 30 mins or > with 2 or fewer rest breaks required and 3/10 LBP or < to maximize her ability to perform chores for her mother    Baseline  9/13: standing and walking 45 mins without having to lean on the counter, pain averages about a 3/10 with standing a walking    Time  4    Period  Weeks    Status  Achieved        PT Long Term Goals - 11/15/17 1110      PT LONG TERM GOAL #1   Title  Pt will have improved MMT by 1 grade or > in all tested muscle groups to demonstrate improved strength, decrease pain, and maximize gait.    Baseline  11/27: MMT improved by 1/2 grade from inital eval and no improvements since last assessment    Time  8    Period  Weeks    Status  Partially Met      PT LONG TERM GOAL #2   Title  Pt will have improved 5xSTS to 15 sec or < from chair with no UE support to demonstrate decreased pain and improved functional BLE strength.    Baseline  11/27: 33 sec with BUE    Time  8    Period  Weeks    Status  On-going      PT LONG TERM GOAL #3   Title  Pt will have improved TUG with LRAD to 15 sec or < and with min to no gait deviations to demonstrate improved overall balance and function in order to promote participation at home and in the community.    Baseline  11/247: 24 sec with rollator    Time  8    Period  Weeks    Status  On-going      PT LONG TERM GOAL #4   Title  Pt will be able to perform SLS on BLE for 10 sec or > with no UE support to improve balance and maximize gait.    Baseline  11/27: able to balance SLS on BLE for 10 sec with 1 UE     Time  8    Period  Weeks    Status  Partially Met      PT LONG TERM GOAL #5   Title  Pt will have bil knee AROM to [facility] to demonstrate improved  overall quad/hip flexor flexibility in order to decrease pain and maximize gait.    Time  8    Period  Weeks    Status  On-going      PT LONG TERM GOAL #6   Title  Pt will have improved lumbar extension AROM to 50% limited or better to decrease pain and demonstrate improved flexibility in order to maximize gait and overall function.    Baseline  11/27: pt stuck  in lordosis as evidenced by significant increased anterior pelvic tilt and posture in standing; her lumbar flexion AROM has improved since last session, however, as she is 25% limtied in her lumbar flexion AROM now compared to 50% limited    Time  8    Period  Weeks    Status  Deferred            Plan - 11/19/2017 1144    Clinical Impression Statement  PT reassessed pt's goals and outcome measures this date. Pt has not made any progress towards her remaining goals since her last reassessment on 09/29/17. Pt's still signifantly limited in her MMT and functional strength, especially of proximal hip musculature. Her 5xSTS and TUG times have not improved since her intial evaluation, indicating continued functional strength and mobility deficits. Her lumbar flexion AROM improved since last reassessment and her BLE flexibility slighly improved, though both are significantly better since starting. Overall, pt has had a general lack of progress over the last several weeks. Due to this, pt will be discharged to her HEP. Pt was encouraged to continue to perform her HEP, initiate a daily walking program, and to decrease the amount of UE support she provides for herself during functional activities.    Rehab Potential  Fair    PT Frequency  2x / week    PT Duration  4 weeks correction from reassessment on 81/01 when pt was recerted 2x/week for 4 more weeks    PT Treatment/Interventions  ADLs/Self Care Home Management;Cryotherapy;Electrical Stimulation;Moist Heat;DME Instruction;Gait training;Stair training;Functional mobility training;Therapeutic  activities;Therapeutic exercise;Balance training;Neuromuscular re-education;Patient/family education;Manual techniques;Passive range of motion;Dry needling;Energy conservation;Taping    PT Next Visit Plan  discharged    PT Home Exercise Plan  eval: supine hip flexor stretch  8/22:  3D hip excursions, supine bridge, pelvic tilts, clams; 8/29: prone quad stretch with rope and pillows under hips; 9/13: supine frog stretch; 9/26: prone hip ext; 10/4: modified HS stretch to 90/90 10/11: seated lumbar flexion stretch; 10/17: seated pelvic tilts, SKTC, DKTC; 10/22: RFIS; 11/13: decompression 1-5    Consulted and Agree with Plan of Care  Patient       Patient will benefit from skilled therapeutic intervention in order to improve the following deficits and impairments:  Abnormal gait, Decreased activity tolerance, Decreased balance, Decreased endurance, Decreased mobility, Decreased range of motion, Decreased strength, Difficulty walking, Hypomobility, Increased muscle spasms, Impaired flexibility, Improper body mechanics, Postural dysfunction, Pain  Visit Diagnosis: Chronic bilateral low back pain without sciatica  Difficulty in walking, not elsewhere classified  Muscle weakness (generalized)  Abnormal posture  Other symptoms and signs involving the musculoskeletal system   G-Codes - November 19, 2017 1149    Functional Assessment Tool Used (Outpatient Only)  clinical judgement, 5xSTS, TUG, MMT, AROM, flexbility    Functional Limitation  Mobility: Walking and moving around    Mobility: Walking and Moving Around Goal Status [id number])  At least 20 percent but less than 40 percent impaired, limited or restricted    Mobility: Walking and Moving Around Discharge Status [phone])  At least 40 percent but less than 60 percent impaired, limited or restricted       Problem List Patient Active Problem List   Diagnosis Date Noted  . Encephalopathy 07/20/2017  . Pulmonary nodules 06/14/2017  . Carotid artery  disease ([place]) 06/13/2016  . Flank pain 02/22/2016  . Cancer of border of tongue ([place]) 07/11/2015  . Diabetes mellitus type 2 in obese ([place]) 06/29/2015  . Radiculopathy of leg 11/18/2014  .  Medicare annual wellness visit, subsequent 04/14/2014  . Urine frequency 04/14/2014  . Cervical cancer screening 04/11/2014  . Obesity 01/20/2014  . 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 Breast cancer screening 01/18/2014  . Low back pain 10/04/2012  . CTS (carpal tunnel syndrome) 06/07/2012  . Tinea corporis 06/07/2012  . Insomnia 06/07/2012  . Knee pain, bilateral 10/16/2011  . Hypothyroidism 01/22/2011  . HTN (hypertension) 01/22/2011  . SLEEP APNEA, OBSTRUCTIVE 01/22/2011  . INTRINSIC ASTHMA, UNSPECIFIED 01/22/2011  . ARTHRITIS, CHRONIC 01/22/2011  . RHEUMATIC FEVER, HX OF 01/22/2011  . CHICKENPOX, HX OF 01/22/2011  . 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 THYROID CYST 01/22/2011  . Hyperlipidemia, mixed 01/22/2011  . ALLERGIC RHINITIS, SEASONAL 01/22/2011  . GERD 01/22/2011  . IRRITABLE BOWEL SYNDROME 01/22/2011  . Disorder of bone and cartilage 01/22/2011  . CARDIAC MURMUR, HX OF 01/22/2011      PHYSICAL THERAPY DISCHARGE SUMMARY  Visits from Start of Care: 25  Current functional level related to goals / functional outcomes: See clinical impression above   Remaining deficits: See clinical impression above   Education / Equipment: Continue HEP Plan: Patient agrees to discharge.  Patient goals were partially met. Patient is being discharged due to lack of progress.  ?????       Geraldine Solar PT, [place] [address] [facility], [place], [address] Phone: [phone]   Fax:  [phone]  Name: Amanda Carlson MRN: [id number] Date of Birth: 30-Apr-1946

## 2017-11-15 NOTE — Telephone Encounter (Signed)
Pt called in to follow up on refill request.   Please advise

## 2017-11-15 NOTE — Patient Instructions (Signed)
  WALKING  Start a walking program.  Try and walk for 5-10 minutes straight of sustained walking, 2-3x/day.

## 2017-11-16 NOTE — Telephone Encounter (Signed)
Last alprazolam RX: 06/14/17, #30 x 3 refills Last OV :06/14/17 Next OV: due 09/2017. Pt cancelled appt on 10/23 and 11/17/17. UDS: 06/08/16, low risk, ?past due CSC: 06/08/16  My access to CS registry is pending. Please advise?

## 2017-11-16 NOTE — Telephone Encounter (Signed)
She needs uds and contract and then she can have #90 without more refills until she is seen.

## 2017-11-17 ENCOUNTER — Encounter (HOSPITAL_COMMUNITY): Payer: Medicare Other | Admitting: Physical Therapy

## 2017-11-17 ENCOUNTER — Ambulatory Visit: Payer: Medicare Other | Admitting: Family Medicine

## 2017-11-17 MED ORDER — FUROSEMIDE 20 MG PO TABS: 20.0000 mg | ORAL_TABLET | Freq: Every day | ORAL | 1 refills | Status: DC | PRN

## 2017-11-17 NOTE — Telephone Encounter (Signed)
rx sent to pharmacy

## 2017-11-18 ENCOUNTER — Other Ambulatory Visit: Payer: Self-pay

## 2017-11-18 ENCOUNTER — Telehealth: Payer: Self-pay | Admitting: Family Medicine

## 2017-11-18 MED ORDER — ALPRAZOLAM 0.25 MG PO TABS: ORAL_TABLET | ORAL | 3 refills | Status: AC

## 2017-11-18 NOTE — Telephone Encounter (Signed)
Rx request/supplies

## 2017-11-18 NOTE — Telephone Encounter (Signed)
Copied from [place]. Topic: Inquiry >> Nov 18, 2017  9:13 AM Pricilla Handler wrote: Reason for CRM: Patient called stating that she needs the following prescriptions ASAP:ALPRAZolam (XANAX) 0.25 MG tablet and Test Strips. Patient is currently out. Patient stated that the pharmacy has already sent a request three times. Patient stated that she needs to pick up these items this morning.

## 2017-11-21 NOTE — Telephone Encounter (Signed)
Patient notified

## 2017-11-22 ENCOUNTER — Other Ambulatory Visit: Payer: Self-pay

## 2017-11-22 ENCOUNTER — Telehealth: Payer: Self-pay | Admitting: Family Medicine

## 2017-11-22 MED ORDER — LANCETS MISC: 0 refills | Status: DC

## 2017-11-22 MED ORDER — GLUCOSE BLOOD VI STRP: ORAL_STRIP | 12 refills | Status: DC

## 2017-11-22 NOTE — Telephone Encounter (Signed)
Pt asking if medications could be sent to [facility] in [place] due to pt staying at her parents house due to her father being given a diagnosis of 6 weeks to live with this week being the 6th week.

## 2017-11-22 NOTE — Telephone Encounter (Signed)
Pt called stating that she would like to have a refill for One Touch Verio test strips and Lancets. Pt states she tests herself once a day. Pt states Dr. Weyman Rodney previously was prescribing the medications but stated that these medications could be taken over by her family doctor unless another issue occurs. Pt is asking for medication to be sent to [facility] in [place], and pharmacy's contact number is [phone].

## 2017-11-23 NOTE — Telephone Encounter (Signed)
Pt following up on request to send lancets and test strips  to [facility] this time. Their phone number [phone].

## 2017-11-24 MED ORDER — LANCETS MISC: 12 refills | Status: DC

## 2017-11-24 MED ORDER — GLUCOSE BLOOD VI STRP: ORAL_STRIP | 12 refills | Status: DC

## 2017-11-24 NOTE — Telephone Encounter (Signed)
Supplies sent to [facility].

## 2017-11-24 NOTE — Addendum Note (Signed)
Addended byDamita Dunnings D on: 11/24/2017 02:29 PM   Modules accepted: Orders

## 2017-12-15 ENCOUNTER — Other Ambulatory Visit: Payer: Self-pay

## 2017-12-15 MED ORDER — FUROSEMIDE 20 MG PO TABS: 20.0000 mg | ORAL_TABLET | Freq: Every day | ORAL | 0 refills | Status: DC | PRN

## 2018-02-06 ENCOUNTER — Other Ambulatory Visit: Payer: Self-pay | Admitting: Family Medicine

## 2018-02-06 NOTE — Telephone Encounter (Signed)
Copied from [place] [id number]. Topic: Quick Communication - Rx Refill/Question >> Feb 06, 2018 10:58 AM Margot Ables wrote: Medication: proventil (last ordered 2016), advair (last ordered 2017), lancets for one touch verio Has the patient contacted their pharmacy? Yes.  Pharmacy stated they did not get a reply Preferred Pharmacy (with phone number or street name): [id number] - [place],  - [address] [phone] (Phone) [phone] (Fax)

## 2018-02-07 MED ORDER — FLUTICASONE-SALMETEROL 250-50 MCG/DOSE IN AEPB: INHALATION_SPRAY | RESPIRATORY_TRACT | 3 refills | Status: DC

## 2018-02-07 MED ORDER — ALBUTEROL SULFATE HFA 108 (90 BASE) MCG/ACT IN AERS: 2.0000 | INHALATION_SPRAY | RESPIRATORY_TRACT | 0 refills | Status: DC | PRN

## 2018-02-07 NOTE — Addendum Note (Signed)
Addended by: Magdalene Molly A on: 02/07/2018 11:50 AM   Modules accepted: Orders

## 2018-02-07 NOTE — Telephone Encounter (Signed)
Medication request sent to PCP  

## 2018-02-07 NOTE — Telephone Encounter (Signed)
Will it be ok to fill both Advair and  Albuterol?  Please advise

## 2018-02-10 ENCOUNTER — Other Ambulatory Visit: Payer: Self-pay

## 2018-02-23 ENCOUNTER — Other Ambulatory Visit: Payer: Self-pay | Admitting: *Deleted

## 2018-02-23 MED ORDER — ONETOUCH DELICA LANCETS 33G MISC: 1 refills | Status: DC

## 2018-03-15 ENCOUNTER — Other Ambulatory Visit: Payer: Self-pay

## 2018-03-15 MED ORDER — ALBUTEROL SULFATE HFA 108 (90 BASE) MCG/ACT IN AERS: 2.0000 | INHALATION_SPRAY | RESPIRATORY_TRACT | 0 refills | Status: DC | PRN

## 2018-05-05 DIAGNOSIS — E559 Vitamin D deficiency, unspecified: Secondary | ICD-10-CM | POA: Diagnosis not present

## 2018-05-05 DIAGNOSIS — J302 Other seasonal allergic rhinitis: Secondary | ICD-10-CM | POA: Diagnosis not present

## 2018-05-05 DIAGNOSIS — I10 Essential (primary) hypertension: Secondary | ICD-10-CM | POA: Diagnosis not present

## 2018-05-05 DIAGNOSIS — Z7409 Other reduced mobility: Secondary | ICD-10-CM | POA: Diagnosis not present

## 2018-05-05 DIAGNOSIS — F419 Anxiety disorder, unspecified: Secondary | ICD-10-CM | POA: Diagnosis not present

## 2018-05-05 DIAGNOSIS — E039 Hypothyroidism, unspecified: Secondary | ICD-10-CM | POA: Diagnosis not present

## 2018-05-05 DIAGNOSIS — E782 Mixed hyperlipidemia: Secondary | ICD-10-CM | POA: Diagnosis not present

## 2018-05-05 DIAGNOSIS — E119 Type 2 diabetes mellitus without complications: Secondary | ICD-10-CM | POA: Diagnosis not present

## 2018-05-05 DIAGNOSIS — Z6827 Body mass index (BMI) 27.0-27.9, adult: Secondary | ICD-10-CM | POA: Diagnosis not present

## 2018-05-05 DIAGNOSIS — R600 Localized edema: Secondary | ICD-10-CM | POA: Diagnosis not present

## 2018-05-05 DIAGNOSIS — M5136 Other intervertebral disc degeneration, lumbar region: Secondary | ICD-10-CM | POA: Diagnosis not present

## 2018-05-05 DIAGNOSIS — G4700 Insomnia, unspecified: Secondary | ICD-10-CM | POA: Diagnosis not present

## 2018-05-05 DIAGNOSIS — R0602 Shortness of breath: Secondary | ICD-10-CM | POA: Diagnosis not present

## 2018-05-10 DIAGNOSIS — K219 Gastro-esophageal reflux disease without esophagitis: Secondary | ICD-10-CM | POA: Diagnosis not present

## 2018-05-10 DIAGNOSIS — Z85810 Personal history of malignant neoplasm of tongue: Secondary | ICD-10-CM | POA: Diagnosis not present

## 2018-05-17 ENCOUNTER — Other Ambulatory Visit: Payer: Self-pay

## 2018-05-17 DIAGNOSIS — J45901 Unspecified asthma with (acute) exacerbation: Secondary | ICD-10-CM | POA: Diagnosis not present

## 2018-05-17 DIAGNOSIS — F419 Anxiety disorder, unspecified: Secondary | ICD-10-CM | POA: Diagnosis not present

## 2018-05-17 DIAGNOSIS — Z0000 Encounter for general adult medical examination without abnormal findings: Secondary | ICD-10-CM | POA: Diagnosis not present

## 2018-05-17 DIAGNOSIS — Z7409 Other reduced mobility: Secondary | ICD-10-CM | POA: Diagnosis not present

## 2018-05-17 DIAGNOSIS — E559 Vitamin D deficiency, unspecified: Secondary | ICD-10-CM | POA: Diagnosis not present

## 2018-05-17 DIAGNOSIS — G4733 Obstructive sleep apnea (adult) (pediatric): Secondary | ICD-10-CM | POA: Diagnosis not present

## 2018-05-17 DIAGNOSIS — E782 Mixed hyperlipidemia: Secondary | ICD-10-CM | POA: Diagnosis not present

## 2018-05-17 DIAGNOSIS — J302 Other seasonal allergic rhinitis: Secondary | ICD-10-CM | POA: Diagnosis not present

## 2018-05-17 DIAGNOSIS — E039 Hypothyroidism, unspecified: Secondary | ICD-10-CM | POA: Diagnosis not present

## 2018-05-17 DIAGNOSIS — E119 Type 2 diabetes mellitus without complications: Secondary | ICD-10-CM | POA: Diagnosis not present

## 2018-05-17 DIAGNOSIS — G4700 Insomnia, unspecified: Secondary | ICD-10-CM | POA: Diagnosis not present

## 2018-05-17 DIAGNOSIS — R600 Localized edema: Secondary | ICD-10-CM | POA: Diagnosis not present

## 2018-05-17 MED ORDER — GLUCOSE BLOOD VI STRP: ORAL_STRIP | 12 refills | Status: AC

## 2018-05-17 MED ORDER — ONETOUCH DELICA LANCETS 33G MISC: 1 refills | Status: AC

## 2018-05-19 ENCOUNTER — Other Ambulatory Visit (HOSPITAL_COMMUNITY): Payer: Self-pay | Admitting: Internal Medicine

## 2018-05-19 DIAGNOSIS — Z780 Asymptomatic menopausal state: Secondary | ICD-10-CM

## 2018-07-21 ENCOUNTER — Other Ambulatory Visit (HOSPITAL_COMMUNITY): Payer: Self-pay | Admitting: Internal Medicine

## 2018-07-21 DIAGNOSIS — Z1231 Encounter for screening mammogram for malignant neoplasm of breast: Secondary | ICD-10-CM

## 2018-07-27 ENCOUNTER — Encounter (HOSPITAL_COMMUNITY): Payer: Self-pay

## 2018-07-27 ENCOUNTER — Ambulatory Visit (HOSPITAL_COMMUNITY)
Admission: RE | Admit: 2018-07-27 | Discharge: 2018-07-27 | Disposition: A | Discharge status: Home / Self Care | Payer: Medicare Other | Source: Ambulatory Visit | Attending: Internal Medicine | Admitting: Internal Medicine

## 2018-07-27 DIAGNOSIS — H52222 Regular astigmatism, left eye: Secondary | ICD-10-CM | POA: Diagnosis not present

## 2018-07-27 DIAGNOSIS — Z13820 Encounter for screening for osteoporosis: Secondary | ICD-10-CM | POA: Diagnosis not present

## 2018-07-27 DIAGNOSIS — Z1231 Encounter for screening mammogram for malignant neoplasm of breast: Secondary | ICD-10-CM | POA: Insufficient documentation

## 2018-07-27 DIAGNOSIS — H5203 Hypermetropia, bilateral: Secondary | ICD-10-CM | POA: Diagnosis not present

## 2018-07-27 DIAGNOSIS — M810 Age-related osteoporosis without current pathological fracture: Secondary | ICD-10-CM | POA: Diagnosis not present

## 2018-07-27 DIAGNOSIS — H524 Presbyopia: Secondary | ICD-10-CM | POA: Diagnosis not present

## 2018-07-27 DIAGNOSIS — Z780 Asymptomatic menopausal state: Secondary | ICD-10-CM

## 2018-07-27 DIAGNOSIS — E119 Type 2 diabetes mellitus without complications: Secondary | ICD-10-CM | POA: Diagnosis not present

## 2018-07-27 DIAGNOSIS — M8588 Other specified disorders of bone density and structure, other site: Secondary | ICD-10-CM | POA: Diagnosis not present

## 2018-07-28 ENCOUNTER — Other Ambulatory Visit: Payer: Self-pay | Admitting: Internal Medicine

## 2018-07-28 DIAGNOSIS — Z85819 Personal history of malignant neoplasm of unspecified site of lip, oral cavity, and pharynx: Secondary | ICD-10-CM

## 2018-09-04 ENCOUNTER — Encounter (HOSPITAL_COMMUNITY)
Admission: RE | Admit: 2018-09-04 | Discharge: 2018-09-04 | Disposition: A | Discharge status: Home / Self Care | Payer: Medicare Other | Source: Ambulatory Visit | Attending: Internal Medicine | Admitting: Internal Medicine

## 2018-09-04 ENCOUNTER — Encounter (HOSPITAL_COMMUNITY): Payer: Self-pay

## 2018-09-04 DIAGNOSIS — Z85819 Personal history of malignant neoplasm of unspecified site of lip, oral cavity, and pharynx: Secondary | ICD-10-CM

## 2018-09-14 DIAGNOSIS — R2242 Localized swelling, mass and lump, left lower limb: Secondary | ICD-10-CM | POA: Diagnosis not present

## 2018-09-14 DIAGNOSIS — J45901 Unspecified asthma with (acute) exacerbation: Secondary | ICD-10-CM | POA: Diagnosis not present

## 2018-09-14 DIAGNOSIS — E559 Vitamin D deficiency, unspecified: Secondary | ICD-10-CM | POA: Diagnosis not present

## 2018-09-14 DIAGNOSIS — F419 Anxiety disorder, unspecified: Secondary | ICD-10-CM | POA: Diagnosis not present

## 2018-09-14 DIAGNOSIS — G4733 Obstructive sleep apnea (adult) (pediatric): Secondary | ICD-10-CM | POA: Diagnosis not present

## 2018-09-18 ENCOUNTER — Encounter (HOSPITAL_COMMUNITY)
Admission: RE | Admit: 2018-09-18 | Discharge: 2018-09-18 | Disposition: A | Discharge status: Home / Self Care | Payer: Medicare Other | Source: Ambulatory Visit | Attending: Internal Medicine | Admitting: Internal Medicine

## 2018-09-18 DIAGNOSIS — C029 Malignant neoplasm of tongue, unspecified: Secondary | ICD-10-CM | POA: Diagnosis not present

## 2018-09-18 DIAGNOSIS — Z85819 Personal history of malignant neoplasm of unspecified site of lip, oral cavity, and pharynx: Secondary | ICD-10-CM | POA: Diagnosis not present

## 2018-09-18 MED ORDER — FLUDEOXYGLUCOSE F - 18 (FDG) INJECTION
10.9300 | Freq: Once | INTRAVENOUS | Status: AC | PRN
  Administered 2018-09-18: 10.93 via INTRAVENOUS

## 2018-09-27 DIAGNOSIS — Z23 Encounter for immunization: Secondary | ICD-10-CM | POA: Diagnosis not present

## 2018-11-13 IMAGING — US US THYROID
1 series · 13 of 25 positions shown · non-contrast
Comparison: 03/07/2015;

CLINICAL DATA: Goiter.  History of left-sided thyroidectomy.

EXAM:
THYROID ULTRASOUND
TECHNIQUE: Ultrasound examination of the thyroid gland and adjacent soft
tissues was performed.

[Series 1: us thyroid · 0.06mm/px · 13 of 44 slices shown]
[im 1/44]
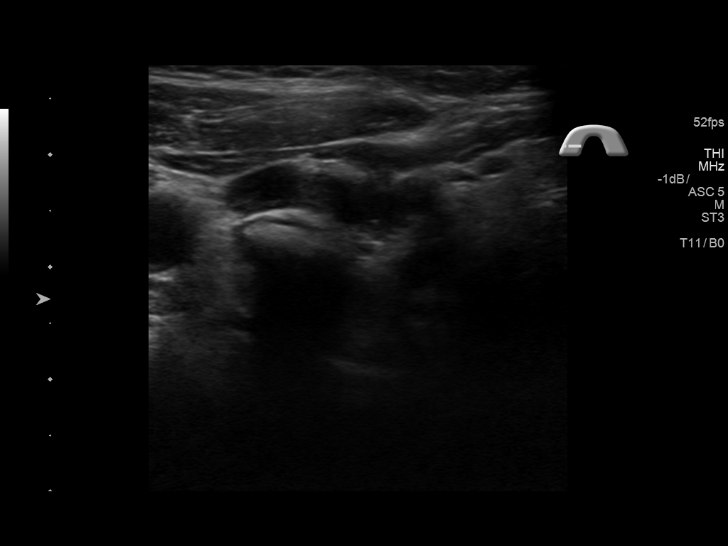
[im 4/44]
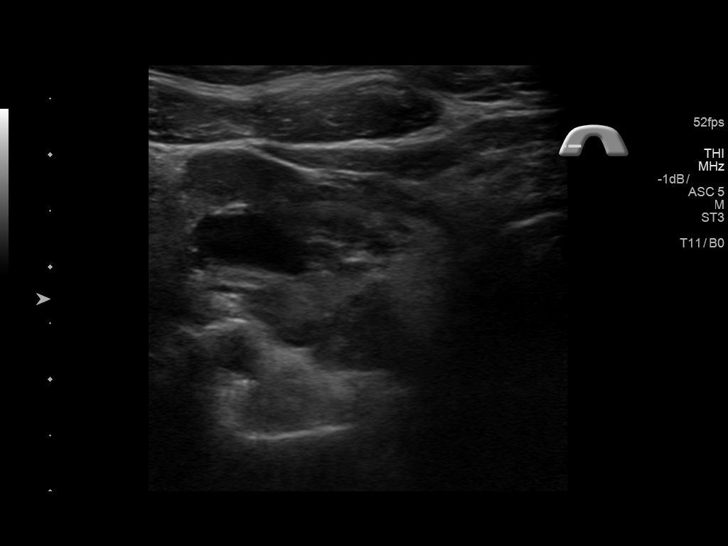
[im 8/44]
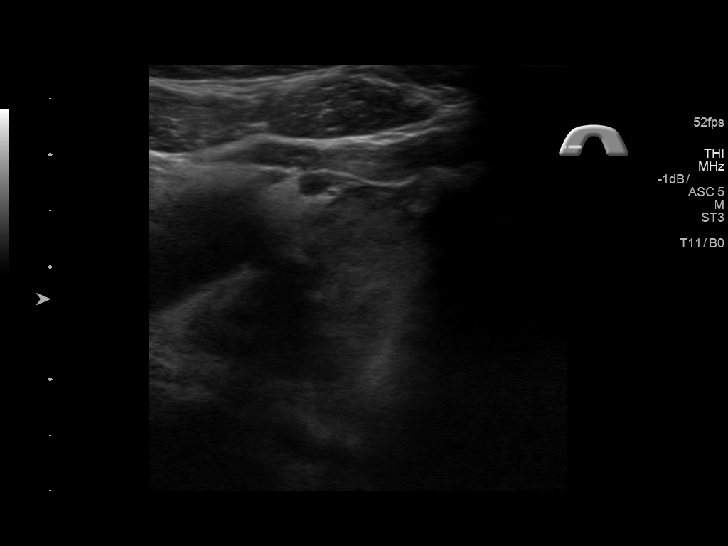
[im 11/44]
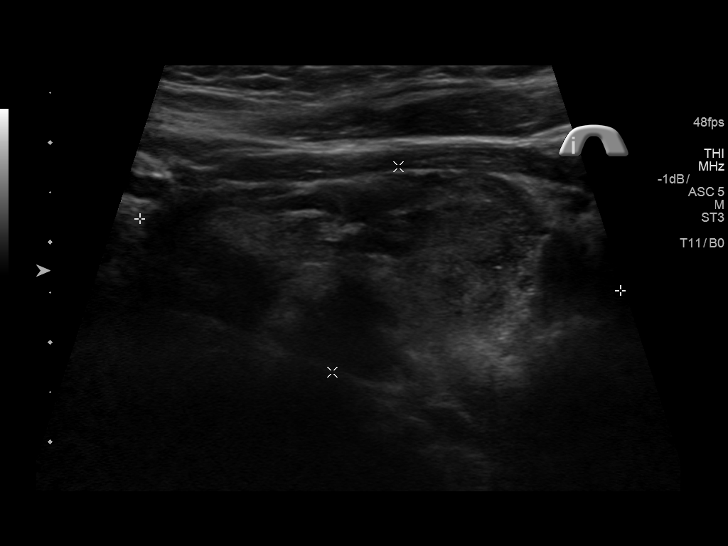
[im 15/44]
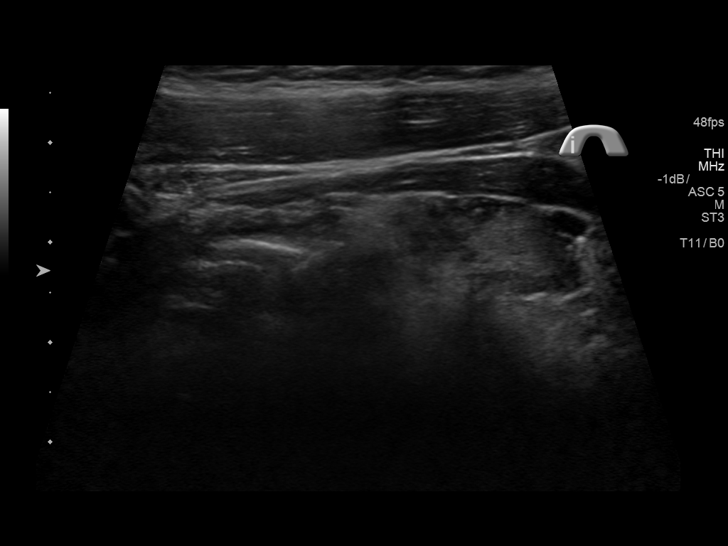
[im 18/44]
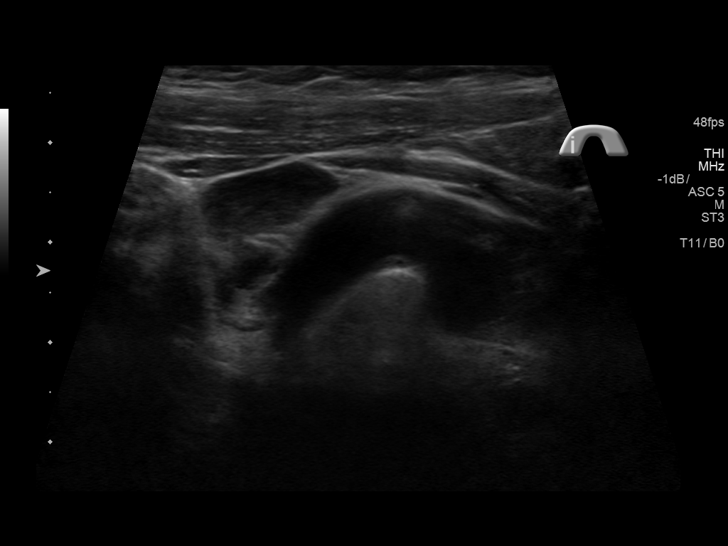
[im 22/44]
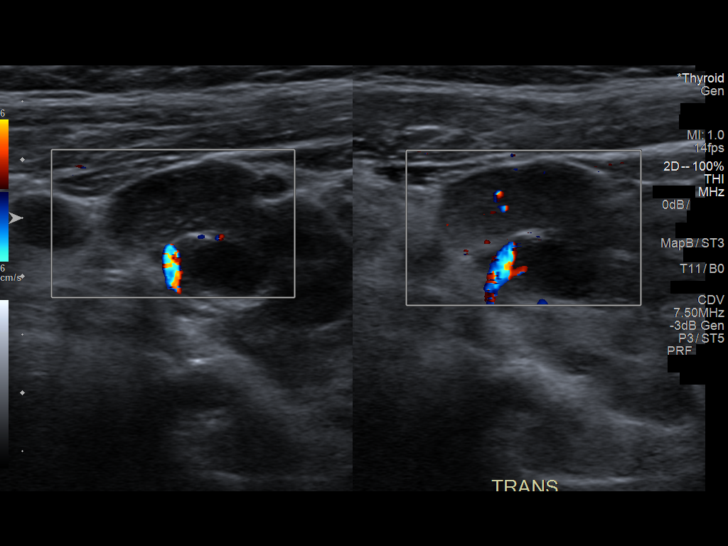
[im 26/44]
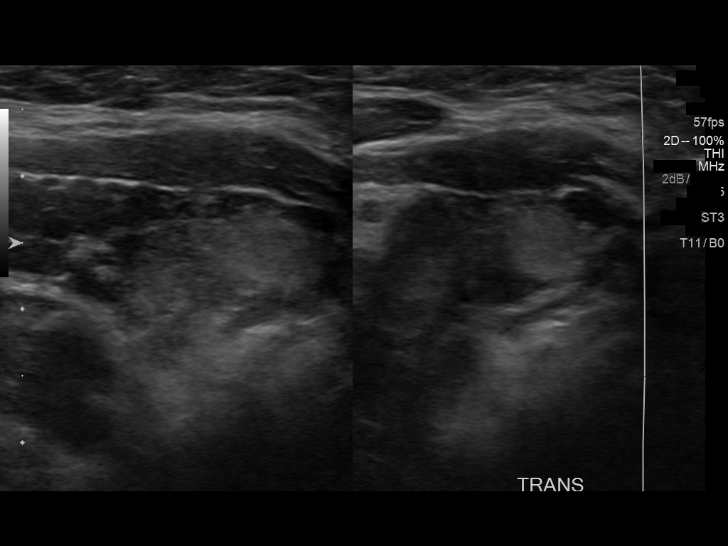
[im 29/44]
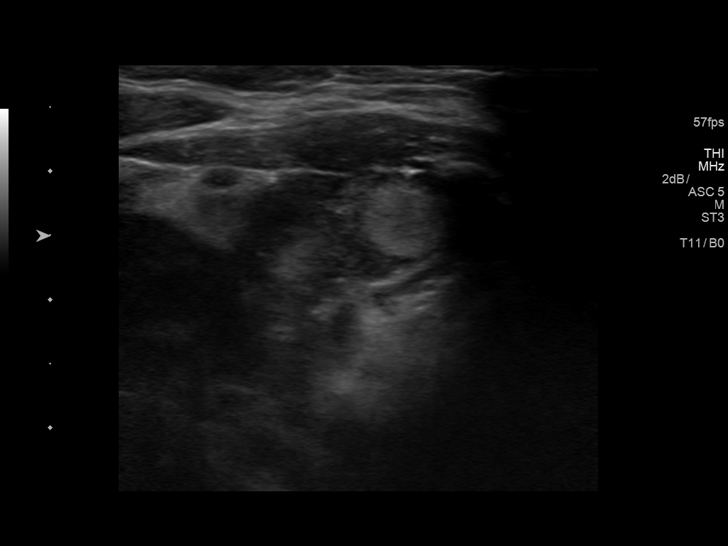
[im 33/44]
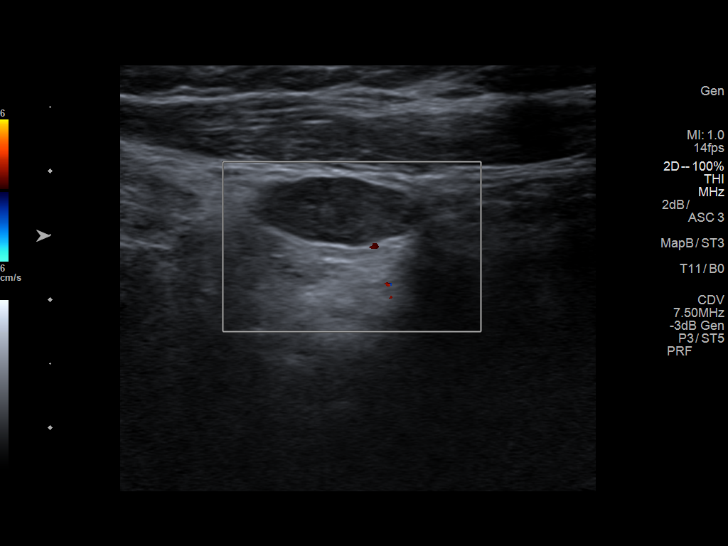
[im 36/44]
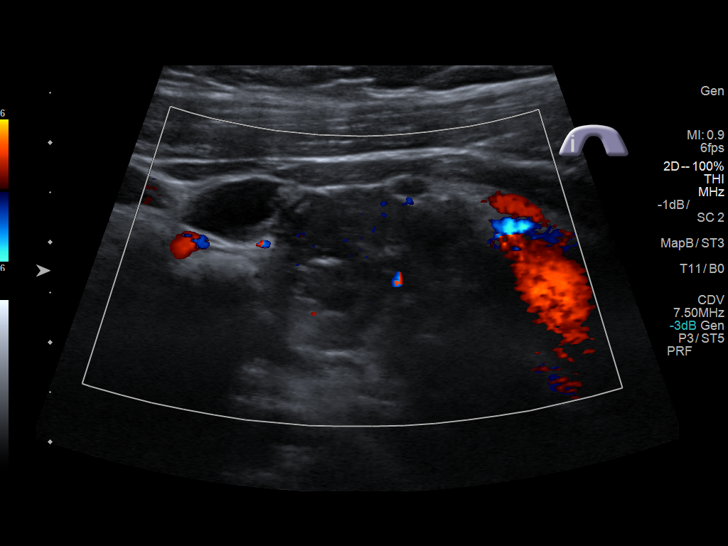
[im 40/44]
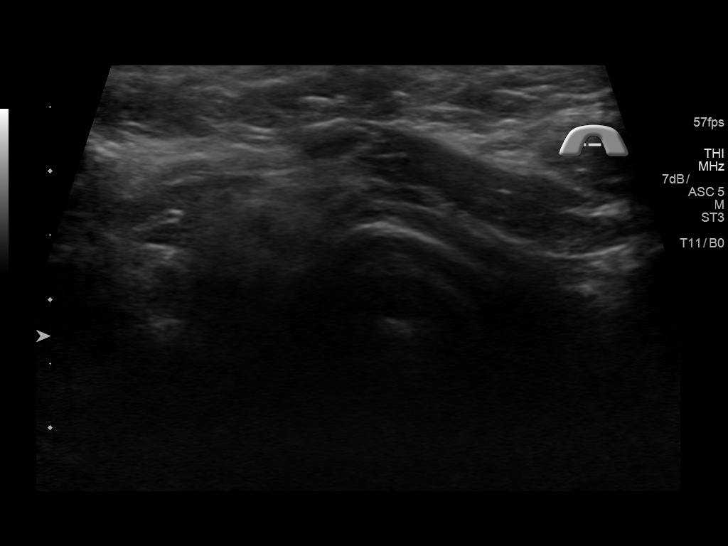
[im 44/44]
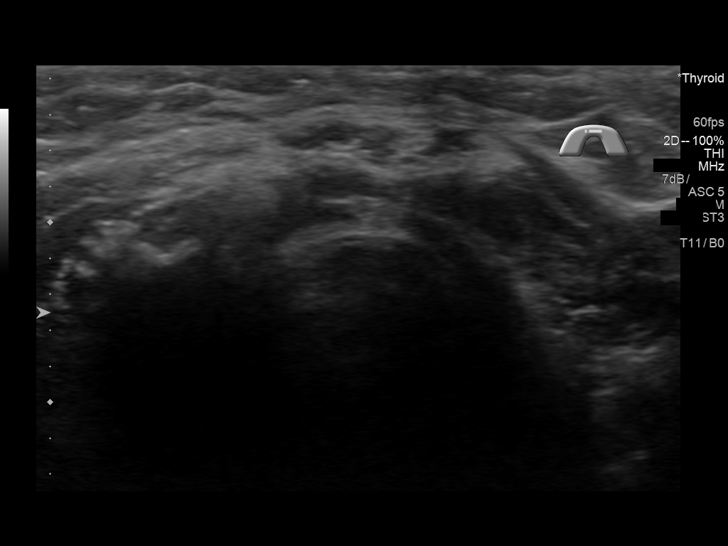

[13 of 25 positions shown; findings below may reference images not displayed]

06/15/2012; ultrasound guided fine-needle
aspiration of right-sided thyroid nodule - 06/28/2012
FINDINGS: Parenchymal Echotexture: Markedly heterogenous

Isthmus: Normal in size measures 0.2 cm in diameter

Right lobe: Normal in size measuring 4.9 x 2.2 x 2.4 cm, unchanged,
previously, 4.5 x 1.9 x 3.2 cm

Left lobe: Surgically absent. There is no residual nodular soft
tissue within the left thyroid lobectomy bed.

_________________________________________________________

Estimated total number of nodules >/= 1 cm: 1

Number of spongiform nodules >/=  2 cm not described below (TR1): 0

Number of mixed cystic and solid nodules >/= 1.5 cm not described
below (TR2): 0

_________________________________________________________

The approximately 0.9 x 0.7 x 0.7 cm hyperechoic solid nodule within
the inferior pole the right lobe of the thyroid is unchanged
compared to the [DATE] examination, previously, 1.3 x 0.6 x 0.6 cm.
Given stability for approximately 5 years, this nodule is favored to
be of benign etiology.

The previously biopsied approximately 1 cm partially cystic,
partially solid nodule within in the superior pole the right lobe of
the thyroid has decreased in size compared to the [DATE]
examination, previously, 2.1 cm. Correlation prior biopsy results is
recommended.
IMPRESSION: 1. Post left thyroid lobectomy without evidence of residual or
locally recurrent disease.
2. No new or enlarging thyroid nodules.
3. Right-sided thyroid nodules (including the previously biopsied
partially cystic, partially solid nodule) are unchanged to decreased
in size compared to the [DATE] examination - correlation with prior
biopsy results is recommended. Stability for 5 years would be
indicative of a benign etiology and as such, repeat sampling and/or
continued dedicated follow-up is not recommended.

The above is in keeping with the ACR TI-RADS recommendations - [HOSPITAL] 2900;[DATE].

## 2018-11-20 DIAGNOSIS — E559 Vitamin D deficiency, unspecified: Secondary | ICD-10-CM | POA: Diagnosis not present

## 2018-11-20 DIAGNOSIS — E782 Mixed hyperlipidemia: Secondary | ICD-10-CM | POA: Diagnosis not present

## 2018-11-20 DIAGNOSIS — E039 Hypothyroidism, unspecified: Secondary | ICD-10-CM | POA: Diagnosis not present

## 2018-11-20 DIAGNOSIS — E119 Type 2 diabetes mellitus without complications: Secondary | ICD-10-CM | POA: Diagnosis not present

## 2018-12-04 DIAGNOSIS — Z6827 Body mass index (BMI) 27.0-27.9, adult: Secondary | ICD-10-CM | POA: Diagnosis not present

## 2018-12-04 DIAGNOSIS — E119 Type 2 diabetes mellitus without complications: Secondary | ICD-10-CM | POA: Diagnosis not present

## 2018-12-04 DIAGNOSIS — R0602 Shortness of breath: Secondary | ICD-10-CM | POA: Diagnosis not present

## 2018-12-04 DIAGNOSIS — K145 Plicated tongue: Secondary | ICD-10-CM | POA: Diagnosis not present

## 2018-12-04 DIAGNOSIS — J45909 Unspecified asthma, uncomplicated: Secondary | ICD-10-CM | POA: Diagnosis not present

## 2018-12-04 DIAGNOSIS — E559 Vitamin D deficiency, unspecified: Secondary | ICD-10-CM | POA: Diagnosis not present

## 2018-12-04 DIAGNOSIS — J302 Other seasonal allergic rhinitis: Secondary | ICD-10-CM | POA: Diagnosis not present

## 2018-12-04 DIAGNOSIS — R600 Localized edema: Secondary | ICD-10-CM | POA: Diagnosis not present

## 2018-12-04 DIAGNOSIS — F419 Anxiety disorder, unspecified: Secondary | ICD-10-CM | POA: Diagnosis not present

## 2018-12-04 DIAGNOSIS — G4700 Insomnia, unspecified: Secondary | ICD-10-CM | POA: Diagnosis not present

## 2018-12-04 DIAGNOSIS — E039 Hypothyroidism, unspecified: Secondary | ICD-10-CM | POA: Diagnosis not present

## 2018-12-04 DIAGNOSIS — R945 Abnormal results of liver function studies: Secondary | ICD-10-CM | POA: Diagnosis not present

## 2018-12-04 DIAGNOSIS — M5136 Other intervertebral disc degeneration, lumbar region: Secondary | ICD-10-CM | POA: Diagnosis not present

## 2018-12-04 DIAGNOSIS — Z7409 Other reduced mobility: Secondary | ICD-10-CM | POA: Diagnosis not present

## 2018-12-04 DIAGNOSIS — G4733 Obstructive sleep apnea (adult) (pediatric): Secondary | ICD-10-CM | POA: Diagnosis not present

## 2018-12-04 DIAGNOSIS — E782 Mixed hyperlipidemia: Secondary | ICD-10-CM | POA: Diagnosis not present

## 2018-12-06 DIAGNOSIS — J45901 Unspecified asthma with (acute) exacerbation: Secondary | ICD-10-CM | POA: Diagnosis not present

## 2018-12-06 DIAGNOSIS — E039 Hypothyroidism, unspecified: Secondary | ICD-10-CM | POA: Diagnosis not present

## 2018-12-06 DIAGNOSIS — E782 Mixed hyperlipidemia: Secondary | ICD-10-CM | POA: Diagnosis not present

## 2018-12-06 DIAGNOSIS — K145 Plicated tongue: Secondary | ICD-10-CM | POA: Diagnosis not present

## 2018-12-06 DIAGNOSIS — J302 Other seasonal allergic rhinitis: Secondary | ICD-10-CM | POA: Diagnosis not present

## 2018-12-06 DIAGNOSIS — E119 Type 2 diabetes mellitus without complications: Secondary | ICD-10-CM | POA: Diagnosis not present

## 2018-12-06 DIAGNOSIS — G4733 Obstructive sleep apnea (adult) (pediatric): Secondary | ICD-10-CM | POA: Diagnosis not present

## 2018-12-06 DIAGNOSIS — M5136 Other intervertebral disc degeneration, lumbar region: Secondary | ICD-10-CM | POA: Diagnosis not present

## 2018-12-06 DIAGNOSIS — E559 Vitamin D deficiency, unspecified: Secondary | ICD-10-CM | POA: Diagnosis not present

## 2018-12-06 DIAGNOSIS — G4700 Insomnia, unspecified: Secondary | ICD-10-CM | POA: Diagnosis not present

## 2018-12-06 DIAGNOSIS — F419 Anxiety disorder, unspecified: Secondary | ICD-10-CM | POA: Diagnosis not present

## 2018-12-06 DIAGNOSIS — J45909 Unspecified asthma, uncomplicated: Secondary | ICD-10-CM | POA: Diagnosis not present

## 2018-12-06 DIAGNOSIS — R8290 Unspecified abnormal findings in urine: Secondary | ICD-10-CM | POA: Diagnosis not present

## 2018-12-28 IMAGING — MR MR HEAD W/O CM
7 series · 43 of 48 positions shown · non-contrast
Comparison: Neck CT 05/12/2015 and earlier.

CLINICAL DATA: 71-year-old female with confusion, perseveration.

EXAM:
MRI HEAD WITHOUT CONTRAST
TECHNIQUE: Multiplanar, multiecho pulse sequences of the brain and surrounding
structures were obtained without intravenous contrast.

[Series 2: DWI · axial · 3.0mm · 0.72mm/px · z∈[-80,+81]mm · 8 of 55 slices shown (1 of 4)]
[im 1/55]
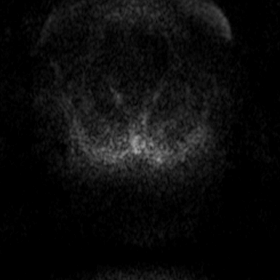
[im 7/55]
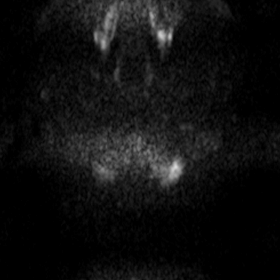
[im 19/55]
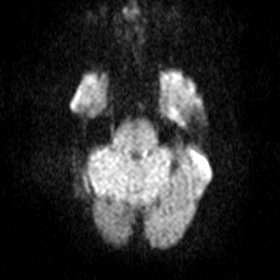
[im 25/55]
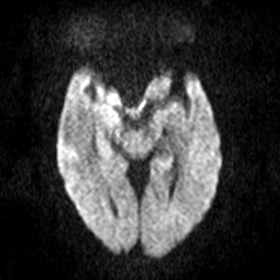
[im 31/55]
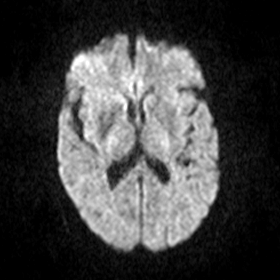
[im 37/55]
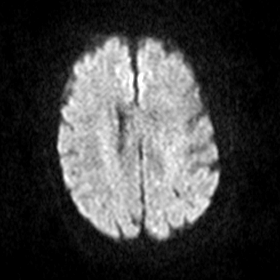
[im 49/55]
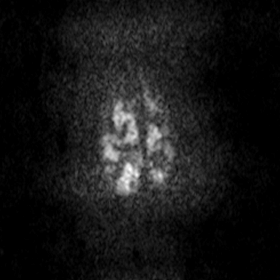
[im 55/55]
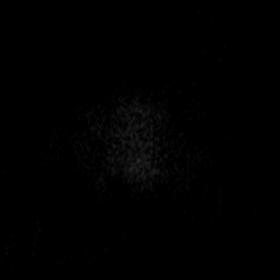

[Series 3: DWI · axial · 3.0mm · 0.74mm/px · z∈[-80,+82]mm · 8 of 55 slices shown (2 of 4)]
[im 1/55]
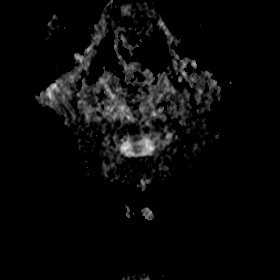
[im 7/55]
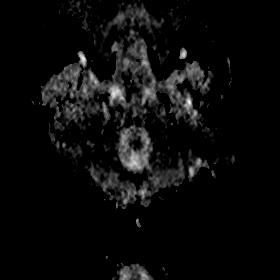
[im 19/55]
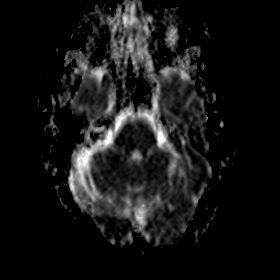
[im 25/55]
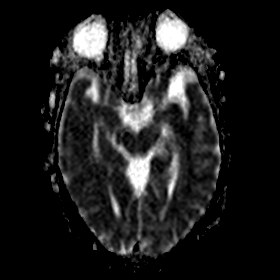
[im 31/55]
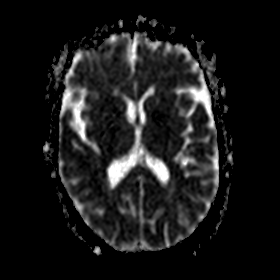
[im 37/55]
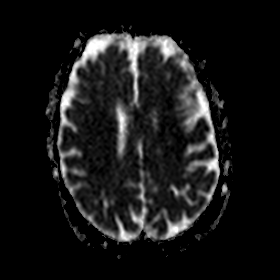
[im 49/55]
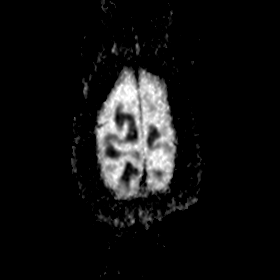
[im 55/55]
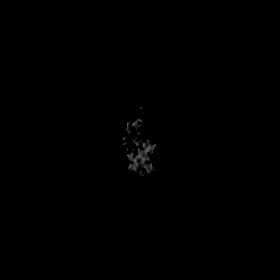

[Series 4: DWI · coronal · 5.0mm · 0.49mm/px · 6 of 33 slices shown (3 of 4)]
[im 1/33]
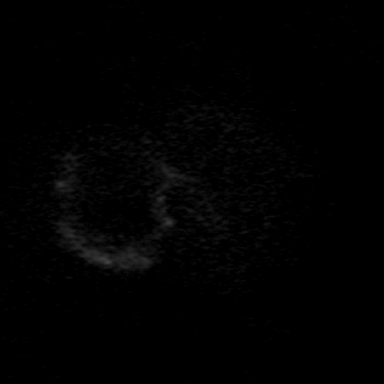
[im 7/33]
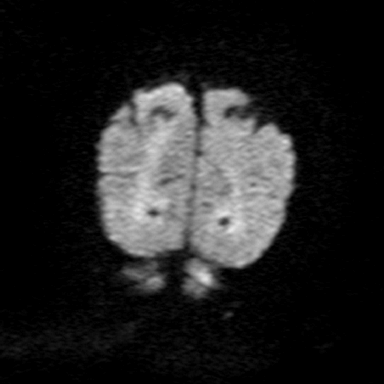
[im 13/33]
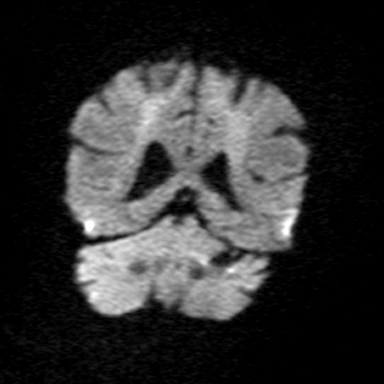
[im 20/33]
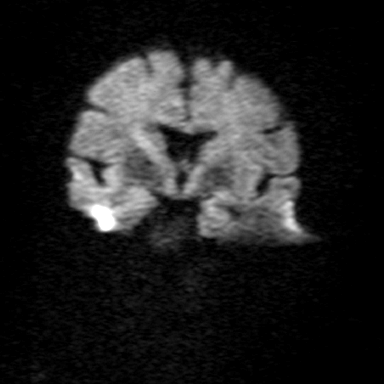
[im 26/33]
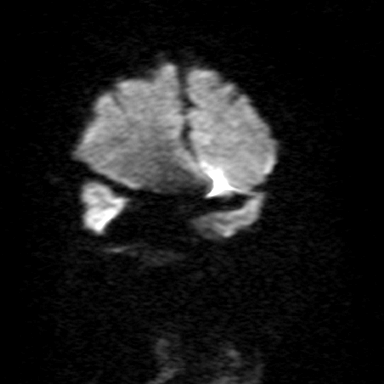
[im 33/33]
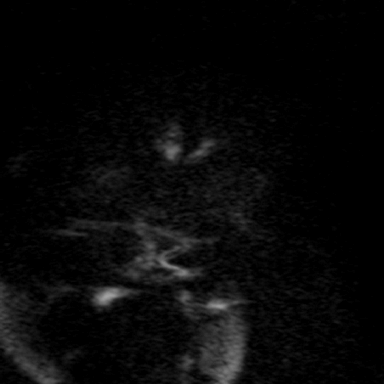

[Series 5: DWI · coronal · 5.0mm · 0.49mm/px · 6 of 34 slices shown (4 of 4)]
[im 1/34]
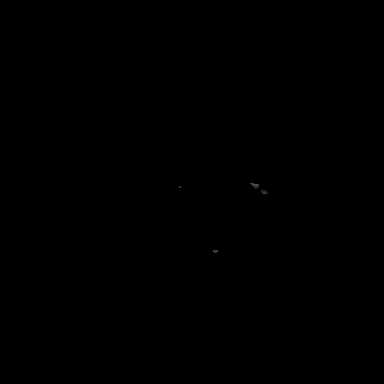
[im 7/34]
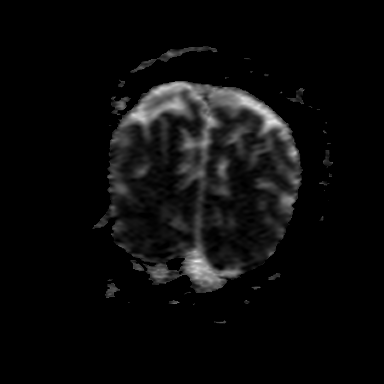
[im 14/34]
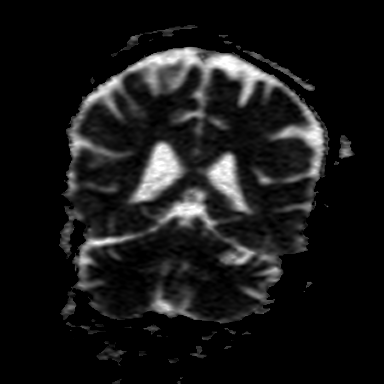
[im 20/34]
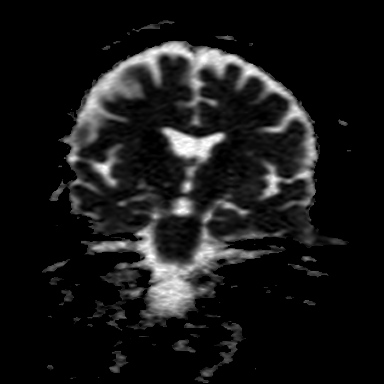
[im 27/34]
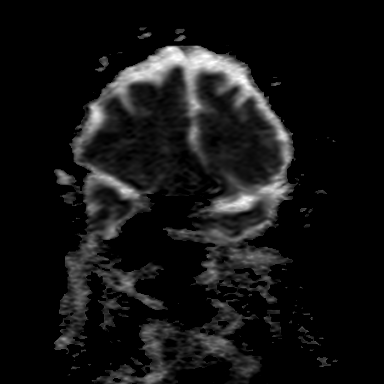
[im 34/34]
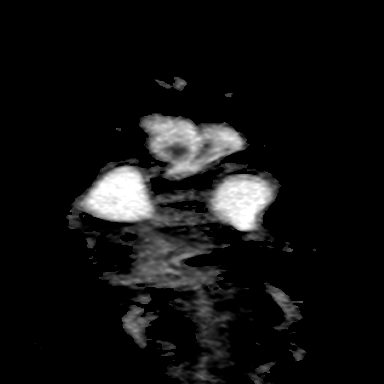

[Series 6: t1_fl2d_sag · sagittal · 5.0mm · 0.40mm/px · 3 of 20 slices shown]
[im 1/20]
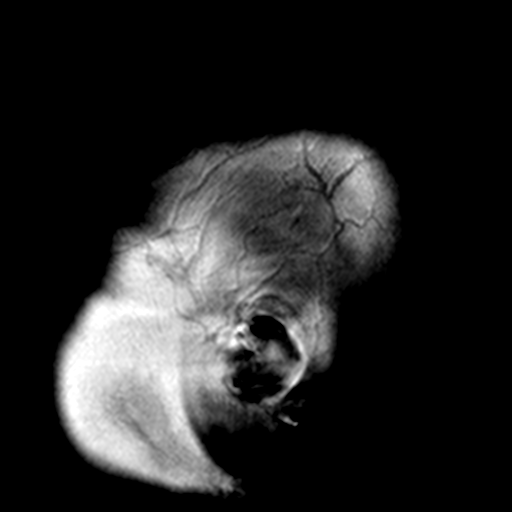
[im 7/20]
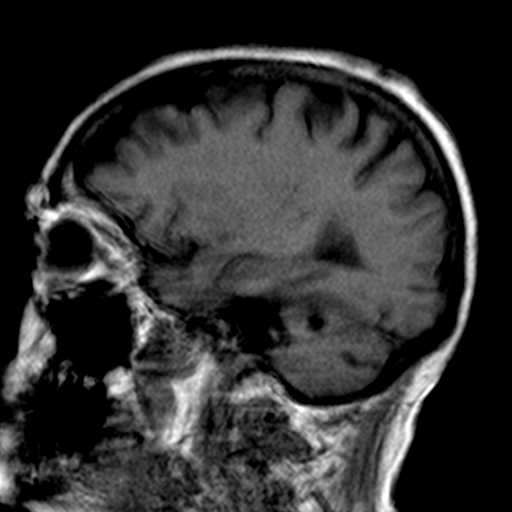
[im 13/20]
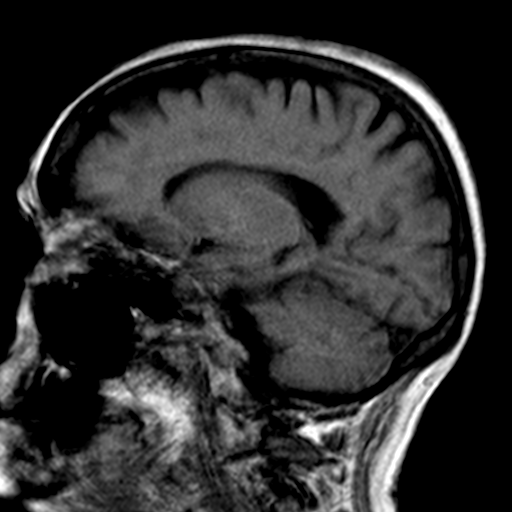

[Series 7: T2 · axial · 5.0mm · 0.62mm/px · z∈[-71,+72]mm · 4 of 22 slices shown]
[im 1/22]
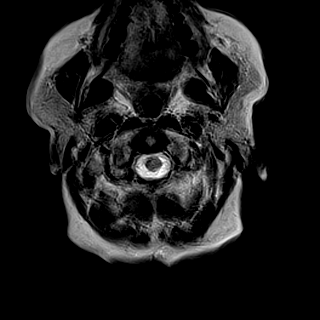
[im 8/22]
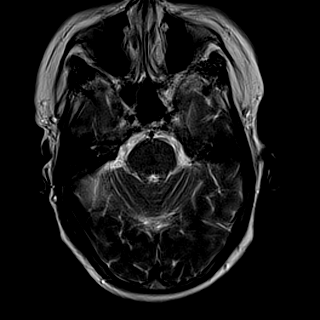
[im 15/22]
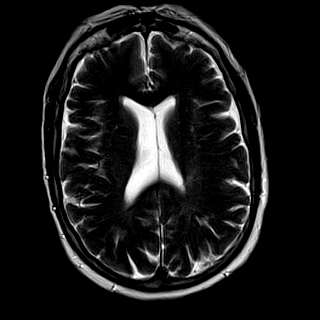
[im 22/22]
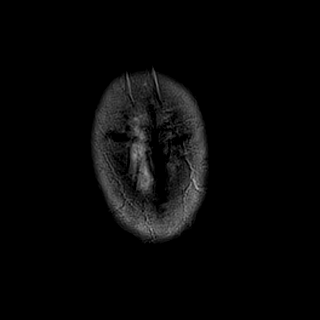

[Series 8: FLAIR · axial · 3.0mm · 0.82mm/px · z∈[-68,+70]mm · 8 of 47 slices shown]
[im 1/47]
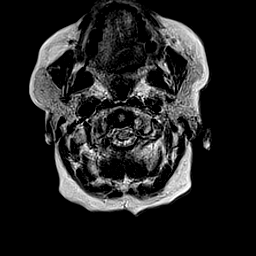
[im 7/47]
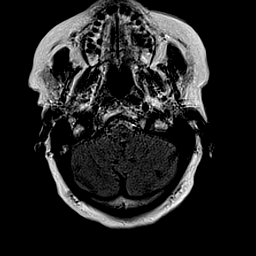
[im 14/47]
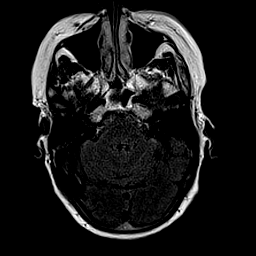
[im 20/47]
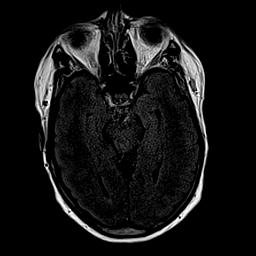
[im 27/47]
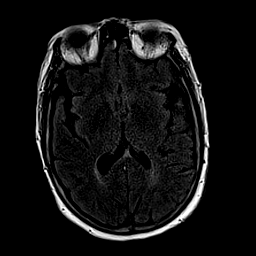
[im 33/47]
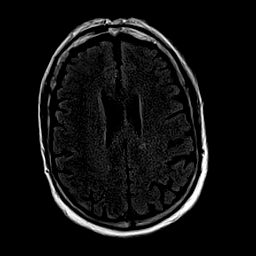
[im 40/47]
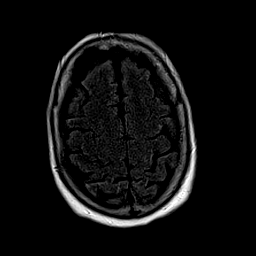
[im 47/47]
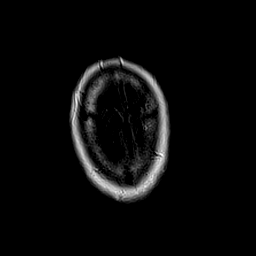

[43 of 48 positions shown; findings below may reference images not displayed]

FINDINGS: The examination had to be discontinued prior to completion due to
patient motion and agitation. No axial T1, T2*, or coronal T2
weighted imaging could be obtained.

Brain: No restricted diffusion or evidence of acute infarction.
Several chronic lacunar infarcts in the cerebellum, most notably the
left SCA territory on series 7, image 7 where probably there is
associated hemosiderin. These appear similar to the 3391 neck CT.

Mild for age supratentorial nonspecific white matter T2 and FLAIR
hyperintensity. No definite cerebral cortical encephalomalacia.

No midline shift, mass effect, evidence of mass lesion,
ventriculomegaly, extra-axial collection or acute intracranial
hemorrhage. Cervicomedullary junction and pituitary are within
normal limits.

Vascular: Major intracranial vascular flow voids are preserved.

Skull and upper cervical spine: Grossly negative. Normal bone marrow
signal.

Sinuses/Orbits: Negative.

Other: Negative scalp soft tissues.
IMPRESSION: 1. No acute intracranial abnormality identified. The examination had
to be discontinued prior to completion.
2. Chronic lacunar infarcts in the cerebellum, appearing similar to
the 3391 neck CT.

## 2019-03-28 DIAGNOSIS — E559 Vitamin D deficiency, unspecified: Secondary | ICD-10-CM | POA: Diagnosis not present

## 2019-03-28 DIAGNOSIS — Z7409 Other reduced mobility: Secondary | ICD-10-CM | POA: Diagnosis not present

## 2019-03-28 DIAGNOSIS — E1169 Type 2 diabetes mellitus with other specified complication: Secondary | ICD-10-CM | POA: Diagnosis not present

## 2019-03-28 DIAGNOSIS — Z85810 Personal history of malignant neoplasm of tongue: Secondary | ICD-10-CM | POA: Diagnosis not present

## 2019-03-28 DIAGNOSIS — E782 Mixed hyperlipidemia: Secondary | ICD-10-CM | POA: Diagnosis not present

## 2019-03-28 DIAGNOSIS — F411 Generalized anxiety disorder: Secondary | ICD-10-CM | POA: Diagnosis not present

## 2019-03-28 DIAGNOSIS — J309 Allergic rhinitis, unspecified: Secondary | ICD-10-CM | POA: Diagnosis not present

## 2019-03-28 DIAGNOSIS — R945 Abnormal results of liver function studies: Secondary | ICD-10-CM | POA: Diagnosis not present

## 2019-03-28 DIAGNOSIS — G4700 Insomnia, unspecified: Secondary | ICD-10-CM | POA: Diagnosis not present

## 2019-03-28 DIAGNOSIS — G4733 Obstructive sleep apnea (adult) (pediatric): Secondary | ICD-10-CM | POA: Diagnosis not present

## 2019-03-28 DIAGNOSIS — J4520 Mild intermittent asthma, uncomplicated: Secondary | ICD-10-CM | POA: Diagnosis not present

## 2019-03-28 DIAGNOSIS — E039 Hypothyroidism, unspecified: Secondary | ICD-10-CM | POA: Diagnosis not present

## 2019-07-20 ENCOUNTER — Other Ambulatory Visit: Payer: Self-pay

## 2019-11-26 DIAGNOSIS — F419 Anxiety disorder, unspecified: Secondary | ICD-10-CM | POA: Diagnosis not present

## 2019-11-26 DIAGNOSIS — M5432 Sciatica, left side: Secondary | ICD-10-CM | POA: Diagnosis not present

## 2019-11-26 DIAGNOSIS — G4709 Other insomnia: Secondary | ICD-10-CM | POA: Diagnosis not present

## 2020-01-10 DIAGNOSIS — M545 Low back pain: Secondary | ICD-10-CM | POA: Diagnosis not present

## 2020-03-14 DIAGNOSIS — G4733 Obstructive sleep apnea (adult) (pediatric): Secondary | ICD-10-CM | POA: Diagnosis not present

## 2020-03-14 DIAGNOSIS — E1169 Type 2 diabetes mellitus with other specified complication: Secondary | ICD-10-CM | POA: Diagnosis not present

## 2020-03-14 DIAGNOSIS — J309 Allergic rhinitis, unspecified: Secondary | ICD-10-CM | POA: Diagnosis not present

## 2020-03-14 DIAGNOSIS — N3001 Acute cystitis with hematuria: Secondary | ICD-10-CM | POA: Diagnosis not present

## 2020-03-14 DIAGNOSIS — E039 Hypothyroidism, unspecified: Secondary | ICD-10-CM | POA: Diagnosis not present

## 2020-03-14 DIAGNOSIS — J302 Other seasonal allergic rhinitis: Secondary | ICD-10-CM | POA: Diagnosis not present

## 2020-03-14 DIAGNOSIS — E119 Type 2 diabetes mellitus without complications: Secondary | ICD-10-CM | POA: Diagnosis not present

## 2020-03-14 DIAGNOSIS — F411 Generalized anxiety disorder: Secondary | ICD-10-CM | POA: Diagnosis not present

## 2020-03-14 DIAGNOSIS — F419 Anxiety disorder, unspecified: Secondary | ICD-10-CM | POA: Diagnosis not present

## 2020-03-14 DIAGNOSIS — E559 Vitamin D deficiency, unspecified: Secondary | ICD-10-CM | POA: Diagnosis not present

## 2020-03-14 DIAGNOSIS — J4520 Mild intermittent asthma, uncomplicated: Secondary | ICD-10-CM | POA: Diagnosis not present

## 2020-03-14 DIAGNOSIS — E782 Mixed hyperlipidemia: Secondary | ICD-10-CM | POA: Diagnosis not present

## 2020-03-14 DIAGNOSIS — G4700 Insomnia, unspecified: Secondary | ICD-10-CM | POA: Diagnosis not present

## 2020-03-24 DIAGNOSIS — Z85810 Personal history of malignant neoplasm of tongue: Secondary | ICD-10-CM | POA: Diagnosis not present

## 2020-06-17 DIAGNOSIS — Z23 Encounter for immunization: Secondary | ICD-10-CM | POA: Diagnosis not present

## 2020-07-26 DIAGNOSIS — Z23 Encounter for immunization: Secondary | ICD-10-CM | POA: Diagnosis not present

## 2020-12-18 DIAGNOSIS — J302 Other seasonal allergic rhinitis: Secondary | ICD-10-CM | POA: Diagnosis not present

## 2020-12-18 DIAGNOSIS — J4520 Mild intermittent asthma, uncomplicated: Secondary | ICD-10-CM | POA: Diagnosis not present

## 2020-12-18 DIAGNOSIS — E559 Vitamin D deficiency, unspecified: Secondary | ICD-10-CM | POA: Diagnosis not present

## 2020-12-18 DIAGNOSIS — G4700 Insomnia, unspecified: Secondary | ICD-10-CM | POA: Diagnosis not present

## 2020-12-18 DIAGNOSIS — E782 Mixed hyperlipidemia: Secondary | ICD-10-CM | POA: Diagnosis not present

## 2020-12-18 DIAGNOSIS — F411 Generalized anxiety disorder: Secondary | ICD-10-CM | POA: Diagnosis not present

## 2020-12-18 DIAGNOSIS — E1169 Type 2 diabetes mellitus with other specified complication: Secondary | ICD-10-CM | POA: Diagnosis not present

## 2020-12-18 DIAGNOSIS — M5136 Other intervertebral disc degeneration, lumbar region: Secondary | ICD-10-CM | POA: Diagnosis not present

## 2020-12-18 DIAGNOSIS — Z85810 Personal history of malignant neoplasm of tongue: Secondary | ICD-10-CM | POA: Diagnosis not present

## 2020-12-18 DIAGNOSIS — J309 Allergic rhinitis, unspecified: Secondary | ICD-10-CM | POA: Diagnosis not present

## 2020-12-18 DIAGNOSIS — E119 Type 2 diabetes mellitus without complications: Secondary | ICD-10-CM | POA: Diagnosis not present

## 2020-12-18 DIAGNOSIS — E039 Hypothyroidism, unspecified: Secondary | ICD-10-CM | POA: Diagnosis not present

## 2020-12-25 DIAGNOSIS — E782 Mixed hyperlipidemia: Secondary | ICD-10-CM | POA: Diagnosis not present

## 2020-12-25 DIAGNOSIS — Z23 Encounter for immunization: Secondary | ICD-10-CM | POA: Diagnosis not present

## 2020-12-25 DIAGNOSIS — K145 Plicated tongue: Secondary | ICD-10-CM | POA: Diagnosis not present

## 2020-12-25 DIAGNOSIS — E119 Type 2 diabetes mellitus without complications: Secondary | ICD-10-CM | POA: Diagnosis not present

## 2020-12-25 DIAGNOSIS — Z7409 Other reduced mobility: Secondary | ICD-10-CM | POA: Diagnosis not present

## 2020-12-25 DIAGNOSIS — E039 Hypothyroidism, unspecified: Secondary | ICD-10-CM | POA: Diagnosis not present

## 2020-12-25 DIAGNOSIS — E559 Vitamin D deficiency, unspecified: Secondary | ICD-10-CM | POA: Diagnosis not present

## 2020-12-25 DIAGNOSIS — R600 Localized edema: Secondary | ICD-10-CM | POA: Diagnosis not present

## 2020-12-25 DIAGNOSIS — J302 Other seasonal allergic rhinitis: Secondary | ICD-10-CM | POA: Diagnosis not present

## 2020-12-25 DIAGNOSIS — J45909 Unspecified asthma, uncomplicated: Secondary | ICD-10-CM | POA: Diagnosis not present

## 2020-12-25 DIAGNOSIS — G4700 Insomnia, unspecified: Secondary | ICD-10-CM | POA: Diagnosis not present

## 2020-12-25 DIAGNOSIS — R945 Abnormal results of liver function studies: Secondary | ICD-10-CM | POA: Diagnosis not present

## 2021-01-15 DIAGNOSIS — E039 Hypothyroidism, unspecified: Secondary | ICD-10-CM | POA: Diagnosis not present

## 2021-01-15 DIAGNOSIS — J302 Other seasonal allergic rhinitis: Secondary | ICD-10-CM | POA: Diagnosis not present

## 2021-01-15 DIAGNOSIS — E1169 Type 2 diabetes mellitus with other specified complication: Secondary | ICD-10-CM | POA: Diagnosis not present

## 2021-01-15 DIAGNOSIS — J4520 Mild intermittent asthma, uncomplicated: Secondary | ICD-10-CM | POA: Diagnosis not present

## 2021-01-15 DIAGNOSIS — G4700 Insomnia, unspecified: Secondary | ICD-10-CM | POA: Diagnosis not present

## 2021-01-15 DIAGNOSIS — M5136 Other intervertebral disc degeneration, lumbar region: Secondary | ICD-10-CM | POA: Diagnosis not present

## 2021-01-15 DIAGNOSIS — E782 Mixed hyperlipidemia: Secondary | ICD-10-CM | POA: Diagnosis not present

## 2021-01-15 DIAGNOSIS — E559 Vitamin D deficiency, unspecified: Secondary | ICD-10-CM | POA: Diagnosis not present

## 2021-01-15 DIAGNOSIS — E119 Type 2 diabetes mellitus without complications: Secondary | ICD-10-CM | POA: Diagnosis not present

## 2021-01-15 DIAGNOSIS — F411 Generalized anxiety disorder: Secondary | ICD-10-CM | POA: Diagnosis not present

## 2021-01-15 DIAGNOSIS — Z85810 Personal history of malignant neoplasm of tongue: Secondary | ICD-10-CM | POA: Diagnosis not present

## 2021-01-15 DIAGNOSIS — J309 Allergic rhinitis, unspecified: Secondary | ICD-10-CM | POA: Diagnosis not present

## 2021-03-03 DIAGNOSIS — E119 Type 2 diabetes mellitus without complications: Secondary | ICD-10-CM | POA: Diagnosis not present

## 2021-03-26 DIAGNOSIS — E559 Vitamin D deficiency, unspecified: Secondary | ICD-10-CM | POA: Diagnosis not present

## 2021-03-26 DIAGNOSIS — E119 Type 2 diabetes mellitus without complications: Secondary | ICD-10-CM | POA: Diagnosis not present

## 2021-03-26 DIAGNOSIS — E782 Mixed hyperlipidemia: Secondary | ICD-10-CM | POA: Diagnosis not present

## 2021-03-26 DIAGNOSIS — E1169 Type 2 diabetes mellitus with other specified complication: Secondary | ICD-10-CM | POA: Diagnosis not present

## 2021-03-26 DIAGNOSIS — E039 Hypothyroidism, unspecified: Secondary | ICD-10-CM | POA: Diagnosis not present

## 2021-03-26 DIAGNOSIS — R945 Abnormal results of liver function studies: Secondary | ICD-10-CM | POA: Diagnosis not present

## 2021-03-31 DIAGNOSIS — G4700 Insomnia, unspecified: Secondary | ICD-10-CM | POA: Diagnosis not present

## 2021-03-31 DIAGNOSIS — K145 Plicated tongue: Secondary | ICD-10-CM | POA: Diagnosis not present

## 2021-03-31 DIAGNOSIS — Z7409 Other reduced mobility: Secondary | ICD-10-CM | POA: Diagnosis not present

## 2021-03-31 DIAGNOSIS — E039 Hypothyroidism, unspecified: Secondary | ICD-10-CM | POA: Diagnosis not present

## 2021-03-31 DIAGNOSIS — E782 Mixed hyperlipidemia: Secondary | ICD-10-CM | POA: Diagnosis not present

## 2021-03-31 DIAGNOSIS — E119 Type 2 diabetes mellitus without complications: Secondary | ICD-10-CM | POA: Diagnosis not present

## 2021-03-31 DIAGNOSIS — R945 Abnormal results of liver function studies: Secondary | ICD-10-CM | POA: Diagnosis not present

## 2021-03-31 DIAGNOSIS — J302 Other seasonal allergic rhinitis: Secondary | ICD-10-CM | POA: Diagnosis not present

## 2021-03-31 DIAGNOSIS — E559 Vitamin D deficiency, unspecified: Secondary | ICD-10-CM | POA: Diagnosis not present

## 2021-03-31 DIAGNOSIS — J45909 Unspecified asthma, uncomplicated: Secondary | ICD-10-CM | POA: Diagnosis not present

## 2021-03-31 DIAGNOSIS — R600 Localized edema: Secondary | ICD-10-CM | POA: Diagnosis not present

## 2021-05-05 DIAGNOSIS — E119 Type 2 diabetes mellitus without complications: Secondary | ICD-10-CM | POA: Diagnosis not present

## 2021-06-02 ENCOUNTER — Other Ambulatory Visit (HOSPITAL_COMMUNITY): Payer: Self-pay | Admitting: Family Medicine

## 2021-06-02 ENCOUNTER — Other Ambulatory Visit: Payer: Self-pay | Admitting: Family Medicine

## 2021-06-02 DIAGNOSIS — Z85819 Personal history of malignant neoplasm of unspecified site of lip, oral cavity, and pharynx: Secondary | ICD-10-CM

## 2021-06-19 DIAGNOSIS — E782 Mixed hyperlipidemia: Secondary | ICD-10-CM | POA: Diagnosis not present

## 2021-06-19 DIAGNOSIS — E118 Type 2 diabetes mellitus with unspecified complications: Secondary | ICD-10-CM | POA: Diagnosis not present

## 2021-06-19 DIAGNOSIS — E039 Hypothyroidism, unspecified: Secondary | ICD-10-CM | POA: Diagnosis not present

## 2021-06-19 DIAGNOSIS — E559 Vitamin D deficiency, unspecified: Secondary | ICD-10-CM | POA: Diagnosis not present

## 2021-06-23 DIAGNOSIS — E039 Hypothyroidism, unspecified: Secondary | ICD-10-CM | POA: Diagnosis not present

## 2021-06-23 DIAGNOSIS — R7401 Elevation of levels of liver transaminase levels: Secondary | ICD-10-CM | POA: Diagnosis not present

## 2021-06-23 DIAGNOSIS — E782 Mixed hyperlipidemia: Secondary | ICD-10-CM | POA: Diagnosis not present

## 2021-06-23 DIAGNOSIS — E1165 Type 2 diabetes mellitus with hyperglycemia: Secondary | ICD-10-CM | POA: Diagnosis not present

## 2021-06-23 DIAGNOSIS — G4733 Obstructive sleep apnea (adult) (pediatric): Secondary | ICD-10-CM | POA: Diagnosis not present

## 2021-06-23 DIAGNOSIS — Z7409 Other reduced mobility: Secondary | ICD-10-CM | POA: Diagnosis not present

## 2021-06-23 DIAGNOSIS — Z859 Personal history of malignant neoplasm, unspecified: Secondary | ICD-10-CM | POA: Diagnosis not present

## 2021-06-23 DIAGNOSIS — E559 Vitamin D deficiency, unspecified: Secondary | ICD-10-CM | POA: Diagnosis not present

## 2021-06-23 DIAGNOSIS — F411 Generalized anxiety disorder: Secondary | ICD-10-CM | POA: Diagnosis not present

## 2021-06-23 DIAGNOSIS — J302 Other seasonal allergic rhinitis: Secondary | ICD-10-CM | POA: Diagnosis not present

## 2021-06-23 DIAGNOSIS — J45909 Unspecified asthma, uncomplicated: Secondary | ICD-10-CM | POA: Diagnosis not present

## 2021-09-22 DIAGNOSIS — E039 Hypothyroidism, unspecified: Secondary | ICD-10-CM | POA: Diagnosis not present

## 2021-09-22 DIAGNOSIS — E1165 Type 2 diabetes mellitus with hyperglycemia: Secondary | ICD-10-CM | POA: Diagnosis not present

## 2021-09-22 DIAGNOSIS — E782 Mixed hyperlipidemia: Secondary | ICD-10-CM | POA: Diagnosis not present

## 2021-09-28 DIAGNOSIS — J302 Other seasonal allergic rhinitis: Secondary | ICD-10-CM | POA: Diagnosis not present

## 2021-09-28 DIAGNOSIS — G4733 Obstructive sleep apnea (adult) (pediatric): Secondary | ICD-10-CM | POA: Diagnosis not present

## 2021-09-28 DIAGNOSIS — J45909 Unspecified asthma, uncomplicated: Secondary | ICD-10-CM | POA: Diagnosis not present

## 2021-09-28 DIAGNOSIS — R7401 Elevation of levels of liver transaminase levels: Secondary | ICD-10-CM | POA: Diagnosis not present

## 2021-09-28 DIAGNOSIS — Z23 Encounter for immunization: Secondary | ICD-10-CM | POA: Diagnosis not present

## 2021-09-28 DIAGNOSIS — F411 Generalized anxiety disorder: Secondary | ICD-10-CM | POA: Diagnosis not present

## 2021-09-28 DIAGNOSIS — E559 Vitamin D deficiency, unspecified: Secondary | ICD-10-CM | POA: Diagnosis not present

## 2021-09-28 DIAGNOSIS — E782 Mixed hyperlipidemia: Secondary | ICD-10-CM | POA: Diagnosis not present

## 2021-09-28 DIAGNOSIS — E1165 Type 2 diabetes mellitus with hyperglycemia: Secondary | ICD-10-CM | POA: Diagnosis not present

## 2021-09-28 DIAGNOSIS — E039 Hypothyroidism, unspecified: Secondary | ICD-10-CM | POA: Diagnosis not present

## 2021-09-28 DIAGNOSIS — Z0001 Encounter for general adult medical examination with abnormal findings: Secondary | ICD-10-CM | POA: Diagnosis not present

## 2021-09-28 DIAGNOSIS — Z7409 Other reduced mobility: Secondary | ICD-10-CM | POA: Diagnosis not present

## 2022-01-08 DIAGNOSIS — E1165 Type 2 diabetes mellitus with hyperglycemia: Secondary | ICD-10-CM | POA: Diagnosis not present

## 2022-01-08 DIAGNOSIS — E782 Mixed hyperlipidemia: Secondary | ICD-10-CM | POA: Diagnosis not present

## 2022-01-08 DIAGNOSIS — E039 Hypothyroidism, unspecified: Secondary | ICD-10-CM | POA: Diagnosis not present

## 2022-01-08 DIAGNOSIS — E559 Vitamin D deficiency, unspecified: Secondary | ICD-10-CM | POA: Diagnosis not present

## 2022-01-11 ENCOUNTER — Other Ambulatory Visit (HOSPITAL_COMMUNITY): Payer: Self-pay | Admitting: Family Medicine

## 2022-01-11 DIAGNOSIS — Z1231 Encounter for screening mammogram for malignant neoplasm of breast: Secondary | ICD-10-CM

## 2022-01-11 DIAGNOSIS — R7401 Elevation of levels of liver transaminase levels: Secondary | ICD-10-CM | POA: Diagnosis not present

## 2022-01-11 DIAGNOSIS — E1165 Type 2 diabetes mellitus with hyperglycemia: Secondary | ICD-10-CM | POA: Diagnosis not present

## 2022-01-11 DIAGNOSIS — E039 Hypothyroidism, unspecified: Secondary | ICD-10-CM | POA: Diagnosis not present

## 2022-01-11 DIAGNOSIS — Z780 Asymptomatic menopausal state: Secondary | ICD-10-CM

## 2022-01-11 DIAGNOSIS — Z859 Personal history of malignant neoplasm, unspecified: Secondary | ICD-10-CM | POA: Diagnosis not present

## 2022-01-11 DIAGNOSIS — J302 Other seasonal allergic rhinitis: Secondary | ICD-10-CM | POA: Diagnosis not present

## 2022-01-11 DIAGNOSIS — Z13820 Encounter for screening for osteoporosis: Secondary | ICD-10-CM

## 2022-01-11 DIAGNOSIS — E782 Mixed hyperlipidemia: Secondary | ICD-10-CM | POA: Diagnosis not present

## 2022-01-11 DIAGNOSIS — F411 Generalized anxiety disorder: Secondary | ICD-10-CM | POA: Diagnosis not present

## 2022-01-11 DIAGNOSIS — E559 Vitamin D deficiency, unspecified: Secondary | ICD-10-CM | POA: Diagnosis not present

## 2022-01-11 DIAGNOSIS — G4733 Obstructive sleep apnea (adult) (pediatric): Secondary | ICD-10-CM | POA: Diagnosis not present

## 2022-01-11 DIAGNOSIS — J45909 Unspecified asthma, uncomplicated: Secondary | ICD-10-CM | POA: Diagnosis not present

## 2022-01-11 DIAGNOSIS — Z7409 Other reduced mobility: Secondary | ICD-10-CM | POA: Diagnosis not present

## 2022-01-26 ENCOUNTER — Other Ambulatory Visit (HOSPITAL_COMMUNITY): Payer: Self-pay | Admitting: Family Medicine

## 2022-01-26 DIAGNOSIS — C029 Malignant neoplasm of tongue, unspecified: Secondary | ICD-10-CM

## 2022-04-09 DIAGNOSIS — E039 Hypothyroidism, unspecified: Secondary | ICD-10-CM | POA: Diagnosis not present

## 2022-04-09 DIAGNOSIS — E1165 Type 2 diabetes mellitus with hyperglycemia: Secondary | ICD-10-CM | POA: Diagnosis not present

## 2022-04-09 DIAGNOSIS — L608 Other nail disorders: Secondary | ICD-10-CM | POA: Diagnosis not present

## 2022-04-12 DIAGNOSIS — Z85810 Personal history of malignant neoplasm of tongue: Secondary | ICD-10-CM | POA: Diagnosis not present

## 2022-04-12 DIAGNOSIS — Z7409 Other reduced mobility: Secondary | ICD-10-CM | POA: Diagnosis not present

## 2022-04-12 DIAGNOSIS — F411 Generalized anxiety disorder: Secondary | ICD-10-CM | POA: Diagnosis not present

## 2022-04-12 DIAGNOSIS — J302 Other seasonal allergic rhinitis: Secondary | ICD-10-CM | POA: Diagnosis not present

## 2022-04-12 DIAGNOSIS — Z13820 Encounter for screening for osteoporosis: Secondary | ICD-10-CM | POA: Diagnosis not present

## 2022-04-12 DIAGNOSIS — J45909 Unspecified asthma, uncomplicated: Secondary | ICD-10-CM | POA: Diagnosis not present

## 2022-04-12 DIAGNOSIS — E039 Hypothyroidism, unspecified: Secondary | ICD-10-CM | POA: Diagnosis not present

## 2022-04-12 DIAGNOSIS — E782 Mixed hyperlipidemia: Secondary | ICD-10-CM | POA: Diagnosis not present

## 2022-04-12 DIAGNOSIS — R7401 Elevation of levels of liver transaminase levels: Secondary | ICD-10-CM | POA: Diagnosis not present

## 2022-04-12 DIAGNOSIS — E1165 Type 2 diabetes mellitus with hyperglycemia: Secondary | ICD-10-CM | POA: Diagnosis not present

## 2022-04-12 DIAGNOSIS — E559 Vitamin D deficiency, unspecified: Secondary | ICD-10-CM | POA: Diagnosis not present

## 2022-04-12 DIAGNOSIS — G4733 Obstructive sleep apnea (adult) (pediatric): Secondary | ICD-10-CM | POA: Diagnosis not present

## 2022-09-06 DIAGNOSIS — Z1211 Encounter for screening for malignant neoplasm of colon: Secondary | ICD-10-CM | POA: Diagnosis not present

## 2022-09-10 LAB — COLOGUARD
COLOGUARD: NEGATIVE
COLOGUARD: NEGATIVE

## 2022-09-10 LAB — EXTERNAL GENERIC LAB PROCEDURE: COLOGUARD: NEGATIVE

## 2022-10-06 DIAGNOSIS — E1165 Type 2 diabetes mellitus with hyperglycemia: Secondary | ICD-10-CM | POA: Diagnosis not present

## 2022-10-06 DIAGNOSIS — E039 Hypothyroidism, unspecified: Secondary | ICD-10-CM | POA: Diagnosis not present

## 2022-10-13 ENCOUNTER — Encounter (HOSPITAL_COMMUNITY): Payer: Self-pay | Admitting: Family Medicine

## 2022-10-13 ENCOUNTER — Other Ambulatory Visit (HOSPITAL_COMMUNITY): Payer: Self-pay | Admitting: Family Medicine

## 2022-10-13 DIAGNOSIS — R7401 Elevation of levels of liver transaminase levels: Secondary | ICD-10-CM | POA: Diagnosis not present

## 2022-10-13 DIAGNOSIS — E1165 Type 2 diabetes mellitus with hyperglycemia: Secondary | ICD-10-CM | POA: Diagnosis not present

## 2022-10-13 DIAGNOSIS — R918 Other nonspecific abnormal finding of lung field: Secondary | ICD-10-CM

## 2022-10-13 DIAGNOSIS — Z23 Encounter for immunization: Secondary | ICD-10-CM | POA: Diagnosis not present

## 2022-10-13 DIAGNOSIS — E039 Hypothyroidism, unspecified: Secondary | ICD-10-CM | POA: Diagnosis not present

## 2022-10-13 DIAGNOSIS — E559 Vitamin D deficiency, unspecified: Secondary | ICD-10-CM | POA: Diagnosis not present

## 2022-10-13 DIAGNOSIS — E041 Nontoxic single thyroid nodule: Secondary | ICD-10-CM

## 2022-10-13 DIAGNOSIS — J45909 Unspecified asthma, uncomplicated: Secondary | ICD-10-CM | POA: Diagnosis not present

## 2022-10-13 DIAGNOSIS — G4733 Obstructive sleep apnea (adult) (pediatric): Secondary | ICD-10-CM | POA: Diagnosis not present

## 2022-10-13 DIAGNOSIS — J302 Other seasonal allergic rhinitis: Secondary | ICD-10-CM | POA: Diagnosis not present

## 2022-10-13 DIAGNOSIS — Z0000 Encounter for general adult medical examination without abnormal findings: Secondary | ICD-10-CM | POA: Diagnosis not present

## 2022-10-13 DIAGNOSIS — E782 Mixed hyperlipidemia: Secondary | ICD-10-CM | POA: Diagnosis not present

## 2022-10-13 DIAGNOSIS — Z7409 Other reduced mobility: Secondary | ICD-10-CM | POA: Diagnosis not present

## 2022-10-13 DIAGNOSIS — F411 Generalized anxiety disorder: Secondary | ICD-10-CM | POA: Diagnosis not present

## 2022-10-19 ENCOUNTER — Other Ambulatory Visit: Payer: Self-pay | Admitting: Family Medicine

## 2022-10-19 DIAGNOSIS — R918 Other nonspecific abnormal finding of lung field: Secondary | ICD-10-CM

## 2022-10-19 DIAGNOSIS — Z85810 Personal history of malignant neoplasm of tongue: Secondary | ICD-10-CM

## 2022-11-04 ENCOUNTER — Ambulatory Visit (HOSPITAL_COMMUNITY)
Admission: RE | Admit: 2022-11-04 | Discharge: 2022-11-04 | Disposition: A | Discharge status: Home / Self Care | Payer: Medicare Other | Source: Ambulatory Visit | Attending: Family Medicine | Admitting: Family Medicine

## 2022-11-04 DIAGNOSIS — Z780 Asymptomatic menopausal state: Secondary | ICD-10-CM | POA: Diagnosis not present

## 2022-11-04 DIAGNOSIS — Z1231 Encounter for screening mammogram for malignant neoplasm of breast: Secondary | ICD-10-CM | POA: Insufficient documentation

## 2022-11-04 DIAGNOSIS — J9811 Atelectasis: Secondary | ICD-10-CM | POA: Diagnosis not present

## 2022-11-04 DIAGNOSIS — M810 Age-related osteoporosis without current pathological fracture: Secondary | ICD-10-CM | POA: Diagnosis not present

## 2022-11-04 DIAGNOSIS — Z85810 Personal history of malignant neoplasm of tongue: Secondary | ICD-10-CM | POA: Diagnosis not present

## 2022-11-04 DIAGNOSIS — C029 Malignant neoplasm of tongue, unspecified: Secondary | ICD-10-CM | POA: Diagnosis not present

## 2022-11-04 DIAGNOSIS — R918 Other nonspecific abnormal finding of lung field: Secondary | ICD-10-CM | POA: Diagnosis not present

## 2022-11-09 ENCOUNTER — Other Ambulatory Visit (HOSPITAL_COMMUNITY): Payer: Self-pay | Admitting: Family Medicine

## 2022-11-09 DIAGNOSIS — K7460 Unspecified cirrhosis of liver: Secondary | ICD-10-CM

## 2022-11-18 ENCOUNTER — Other Ambulatory Visit (HOSPITAL_COMMUNITY): Payer: Medicare Other

## 2022-11-19 ENCOUNTER — Ambulatory Visit (HOSPITAL_COMMUNITY)
Admission: RE | Admit: 2022-11-19 | Discharge: 2022-11-19 | Disposition: A | Discharge status: Home / Self Care | Payer: Medicare Other | Source: Ambulatory Visit | Attending: Family Medicine | Admitting: Family Medicine

## 2022-11-19 DIAGNOSIS — K7460 Unspecified cirrhosis of liver: Secondary | ICD-10-CM | POA: Diagnosis not present

## 2022-11-24 DIAGNOSIS — E119 Type 2 diabetes mellitus without complications: Secondary | ICD-10-CM | POA: Diagnosis not present

## 2023-01-11 ENCOUNTER — Encounter (INDEPENDENT_AMBULATORY_CARE_PROVIDER_SITE_OTHER): Payer: Self-pay | Admitting: *Deleted

## 2023-03-17 ENCOUNTER — Encounter (INDEPENDENT_AMBULATORY_CARE_PROVIDER_SITE_OTHER): Payer: Self-pay | Admitting: Gastroenterology

## 2023-03-17 ENCOUNTER — Encounter (INDEPENDENT_AMBULATORY_CARE_PROVIDER_SITE_OTHER): Payer: Self-pay

## 2023-03-17 ENCOUNTER — Ambulatory Visit (INDEPENDENT_AMBULATORY_CARE_PROVIDER_SITE_OTHER): Payer: Medicare Other | Admitting: Gastroenterology

## 2023-03-17 ENCOUNTER — Telehealth (INDEPENDENT_AMBULATORY_CARE_PROVIDER_SITE_OTHER): Payer: Self-pay | Admitting: *Deleted

## 2023-03-17 VITALS — BP 103/67 | HR 80 | Temp 97.5°F | Ht 67.0 in | Wt 169.0 lb

## 2023-03-17 DIAGNOSIS — K7469 Other cirrhosis of liver: Secondary | ICD-10-CM

## 2023-03-17 DIAGNOSIS — Z1159 Encounter for screening for other viral diseases: Secondary | ICD-10-CM | POA: Insufficient documentation

## 2023-03-17 DIAGNOSIS — R7989 Other specified abnormal findings of blood chemistry: Secondary | ICD-10-CM | POA: Insufficient documentation

## 2023-03-17 NOTE — Telephone Encounter (Signed)
Patient seen today. She wants to know if diabetes could have affected her liver. Her A1c at onetime with up to 10 and she went on insulin. She worked on diet and losing weight and now off of insulin and last A1c was around 6.4.

## 2023-03-17 NOTE — Patient Instructions (Signed)
-  We will check labs today to rule out underlying causes of your cirrhosis -We will repeat US of the liver in June - Reduce salt intake to <2 g per day - Can take Tylenol max of 2 g per day (650 mg q8h) for pain - Avoid NSAIDs for pain (naprosyn, aleve, goody powder, ibuprofen, advil) - Avoid eating raw oysters/shellfish - drink Ensure protein shake every night before going to sleep  Continue with dietary changes as you are doing. Please make me aware of any episodes of confusion, swelling in abdomen or legs, yellowing of skin or eyes.  Follow up 6 months  It was a pleasure to see you today. I want to create trusting relationships with patients and provide genuine, compassionate, and quality care. I truly value your feedback! please be on the lookout for a survey regarding your visit with me today. I appreciate your input about our visit and your time in completing this!    Yamilette Garretson L. Alver Sorrow, MSN, APRN, AGNP-C Adult-Gerontology Nurse Practitioner [facility]

## 2023-03-17 NOTE — Telephone Encounter (Signed)
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 We will know more once I have her labs back next week, she should continue with dietary changes as we talked about this morning.   Patient verbalized understanding.

## 2023-03-17 NOTE — Progress Notes (Addendum)
Referring Provider: Celene Squibb, MD Primary Care Physician:  Celene Squibb, MD Primary GI Physician:new   Chief Complaint  Patient presents with   Cirrhosis    Referred for abnormal ultrasound.    HPI:   Amanda Carlson is a 77 y.o. female with past medical history of carpal tunnel, DM, GERD, hypothyroidism, asthma, IBS, sleep apnea, tongue cancer   Patient presenting today as a new patient for cirrhosis.   RUQ [place] 11/2022 with nodular contour of the liver suggestive of cirrhosis, CT chest in Nov 2023 also with findings consistent with cirrhosis.   Last LFTs in October 2023 elevated with AST 49, ALT 44, ptl count 175k. Ntoably LFTs mildly elevated since atleast January 2023 with alk phos at that time 186, T bili remained normal.   Patient states that she has never been a heavy drinker, has an occasional drink 1-2x/year. She was told that [place] her PCP did showed cirrhosis which she was confused about given her history of rare alcohol intake. No history of liver issues in the past that she is aware of. She has lost down from 190lbs to 169 working on her diet. She is trying to lose a few more pounds. She denies any swelling in her in abdomen, episodes of confusion, itching or pruritus. No previous illicit drug use or tattoos from questionable sources.   She has history of IBS typically with diarrhea, but this is well controlled with diet. Denies rectal bleeding or melena.   NSAID use: naprosyn, 1-2 per day for chronic pain  (counseled on cessation of NSAIDs) Social hx: rare etoh, no tobacco  Fam hx: no CRC or pancreatic cancer   Negative cologuard in 2023 Last Colonoscopy:12/2004  Last Endoscopy: never   Recommendations:    Past Medical History:  Diagnosis Date   ALLERGIC RHINITIS, SEASONAL 01/22/2011   Qualifier: Diagnosis of  By: Charlett Blake MD, Stacey     Arthritis    Breast cancer screening 01/18/2014   Calcified Right side? Follows at [place]    hx of   Cervical  cancer screening 04/11/2014   CTS (carpal tunnel syndrome) 06/07/2012   Diabetes mellitus type 2 in obese ([place]) 06/29/2015   Diabetes mellitus without complication ([place])    Difficulty swallowing    Liquids and foods at times   Family history of adverse reaction to anesthesia    Pts mother has a difficult time waking up after anesthesia   GERD 01/22/2011   Qualifier: Diagnosis of  By: Charlett Blake MD, Erline Levine     HYPOTHYROIDISM 01/22/2011   Qualifier: Diagnosis of  By: Charlett Blake MD, Boyce Medici with Dr Chalmers Cater    Insomnia 06/07/2012   Intrinsic asthma, unspecified 01/22/2011   Qualifier: Diagnosis of  By: Charlett Blake MD, Stacey     Irritable bowel syndrome 01/22/2011   Qualifier: Diagnosis of  By: Charlett Blake MD, Stacey     Knee pain, bilateral 10/16/2011   Knee pain, right 10/16/2011   Lesion of breast 01/18/2014   Calcified Right side? 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  SCC of tongue.     THYROID CYST 01/22/2011   Qualifier: Diagnosis of  By: Charlett Blake MD, Stacey     Tinea corporis 06/07/2012   Tinea corporis 02/22/2016   Tongue cancer ([place])    Tongue lesion 01/18/2014    Past Surgical History:  Procedure Laterality Date   cervical fusion, discectomy, metal plate and 2 screws in place     CHOLECYSTECTOMY     EXCISION OF TONGUE LESION Left 07/11/2015   Procedure: EXCISION OF TONGUE LESION;  Surgeon: Melissa Montane, MD;  Location: [facility];  Service: ENT;  Laterality: Left;   KNEE ARTHROSCOPY     right   RADICAL NECK DISSECTION Left 07/11/2015   Procedure: RADICAL NECK DISSECTION;  Surgeon: Melissa Montane, MD;   Location: [facility];  Service: ENT;  Laterality: Left;  Left neck dissection and left tongue resection   THYROIDECTOMY, PARTIAL     TUBAL LIGATION      Current Outpatient Medications  Medication Sig Dispense Refill   albuterol (PROVENTIL HFA;VENTOLIN HFA) 108 (90 Base) MCG/ACT inhaler Inhale 2 puffs into the lungs every 4 (four) hours as needed for wheezing or shortness of breath. 1 Inhaler 0   ALPRAZolam (XANAX) 0.25 MG tablet TAKE 1 TABLET BY MOUTH EVERY DAY AS NEEDED FOR ANXIETY OR SLEEP 30 tablet 3   baclofen (LIORESAL) 20 MG tablet TAKE 1 TABLET BY MOUTH 4 TIMES A DAY AS NEEDED FOR MUSCLE SPASMS 120 tablet 1   fluticasone (FLONASE) 50 MCG/ACT nasal spray PLACE 2 SPRAYS INTO BOTH NOSTRILS DAILY AS NEEDED FOR ALLERGIES OR RHINITIS. 16 g 6   Fluticasone-Salmeterol (ADVAIR DISKUS) 250-50 MCG/DOSE AEPB INHALE 1 PUFF INTO THE LUNGS EVERY 12 HOURS 60 each 3   furosemide (LASIX) 20 MG tablet Take 1 tablet (20 mg total) by mouth daily as needed for fluid or edema. 90 tablet 0   glucose blood (IGLUCOSE TEST STRIPS) test strip Check blood sugar once daily 100 each 12   levothyroxine (SYNTHROID, LEVOTHROID) 100 MCG tablet Take 100 mcg by mouth daily before breakfast.     metFORMIN (GLUCOPHAGE-XR) 500 MG 24 hr tablet Take 500 mg by mouth 2 (two) times daily.      ONETOUCH DELICA LANCETS 99991111 MISC Use as directed once daily.  E11.9 100 each 1   simvastatin (ZOCOR) 80 MG tablet Take 80 mg by mouth at bedtime.      traMADol (ULTRAM) 50 MG tablet Take 1 tablet (50 mg total) by mouth 3 (three) times daily as needed. 120 tablet 0   Vitamin D, Ergocalciferol, (DRISDOL) 50000 units CAPS capsule Take 1 capsule (50,000 Units total) by mouth every 7 (seven) days. Mondays 4 capsule 4   vitamin E 1000 UNIT capsule Take 3,000 Units by mouth daily.     No current facility-administered medications for this visit.    Allergies as of 03/17/2023 - Review Complete 03/17/2023  Allergen Reaction Noted   Codeine Itching  01/22/2011   Tetanus toxoid Swelling    Adhesive [tape] Rash 09/18/2013    Family History  Problem Relation Age of Onset   Atrial fibrillation Mother    Other Mother        Back pain, CHF   Arthritis Mother    Osteoporosis Mother    Heart disease Father    Hypertension Father    Diabetes Father    Sleep apnea Father    Atrial fibrillation Father  paroxysmal   Cancer Father        skin cancers on face   Diabetes Brother    Diabetes Maternal Aunt        X 2 aunts   Cancer Maternal Grandmother    Diabetes Maternal Grandfather    Stroke Paternal Grandmother    Heart disease Paternal Grandfather    Stroke Paternal Grandfather     Social History   Socioeconomic History   Marital status: Divorced    Spouse name: Not on file   Number of children: Not on file   Years of education: Not on file   Highest education level: Not on file  Occupational History   Not on file  Tobacco Use   Smoking status: Never   Smokeless tobacco: Never  Substance and Sexual Activity   Alcohol use: No    Alcohol/week: 0.0 standard drinks of alcohol    Comment: No intake of alcohol in greater than 10 yrs   Drug use: No   Sexual activity: Not on file  Other Topics Concern   Not on file  Social History Narrative   Not on file   Social Determinants of Health   Financial Resource Strain: Not on file  Food Insecurity: Not on file  Transportation Needs: Not on file  Physical Activity: Not on file  Stress: Not on file  Social Connections: Not on file   Review of systems General: negative for malaise, night sweats, fever, chills, weight loss Neck: Negative for lumps, goiter, pain and significant neck swelling Resp: Negative for cough, wheezing, dyspnea at rest CV: Negative for chest pain, leg swelling, palpitations, orthopnea GI: denies melena, hematochezia, nausea, vomiting, diarrhea, constipation, dysphagia, odyonophagia, early satiety or unintentional weight loss.  MSK: Negative  for joint pain or swelling, back pain, and muscle pain. Derm: Negative for itching or rash Psych: Denies depression, anxiety, memory loss, confusion. No homicidal or suicidal ideation.  Heme: Negative for prolonged bleeding, bruising easily, and swollen nodes. Endocrine: Negative for cold or heat intolerance, polyuria, polydipsia and goiter. Neuro: negative for tremor, gait imbalance, syncope and seizures. The remainder of the review of systems is noncontributory.  Physical Exam: There were no vitals taken for this visit. General:   Alert and oriented. No distress noted. Pleasant and cooperative. Seated in wheelchair Head:  Normocephalic and atraumatic. Eyes:  Conjuctiva clear without scleral icterus. Mouth:  Oral mucosa pink and moist. Good dentition. No lesions. Heart: Normal rate and rhythm, s1 and s2 heart sounds present.  Lungs: Clear lung sounds in all lobes. Respirations equal and unlabored. Abdomen:  +BS, soft, non-tender and non-distended. No rebound or guarding. No HSM or masses noted. Derm: No palmar erythema or jaundice Msk:  Symmetrical without gross deformities. Normal posture. Extremities:  Without edema. Neurologic:  Alert and  oriented x4 Psych:  Alert and cooperative. Normal mood and affect.  Invalid input(s): "6 MONTHS"   ASSESSMENT: DORCUS ALEJANDRE is a 77 y.o. female presenting today as a new patient for cirrhosis.  Recent US and CT with findings of nodular liver suggestive of cirrhosis. 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 Discussion had with patient regarding possible etiology of cirrhosis to include fatty liver, AIH, viral infection, iron overload, as well as implications of disease to include importance of routine labs and imaging every 6 months with increased risk of incidence of HCC, bleeding, especially with increased risk of esophageal varices, retention of ammonia/fluid, importance of avoiding alcohol and NSAIDs (she was  counseled on stopping use of naprosyn and using tylenol instead). Pt verbalized understanding of this.  Will proceed with serologies to rule out the above mentioned underlying causes of cirrhosis as well as INR, AFP, and update HFP and CBC. For now she appears well compensated, will need EGD if platelet count is <150k.    PLAN:  -AFP, INR, HFP , CBC - (ANA, AMA, ASMA, A1A, iron studies, Acute hep panel, Ceruloplasmin, IgG, IgM) -  EGD if plt count <150k - Repeat RUQ [place] for [facility] screening June 2024 - Reduce salt intake to <2 g per day - Can take Tylenol max of 2 g per day (650 mg q8h) for pain - Avoid NSAIDs for pain (stop naprosyn) - Avoid eating raw oysters/shellfish - Ensure every night before going to sleep -pt to make me aware of swelling of abdomen, confusion, pruritus, jaundice   All questions were answered, patient verbalized understanding and is in agreement with plan as outlined above.    Follow Up: 6 months   Amanda Maudlin L. Alver Sorrow, MSN, APRN, AGNP-C Adult-Gerontology Nurse Practitioner [facility]  I have reviewed the note and agree with the APP's assessment as described in this progress note  Maylon Peppers, MD Gastroenterology and Hepatology [facility]

## 2023-03-23 LAB — PROTIME-INR
INR: 1
Prothrombin Time: 10.8 s (ref 9.0–11.5)

## 2023-03-23 LAB — ANTI-NUCLEAR AB-TITER (ANA TITER)
ANA TITER: 1:320 {titer} — ABNORMAL HIGH
ANA Titer 1: 1:80 {titer} — ABNORMAL HIGH

## 2023-03-23 LAB — CBC
HCT: 40.7 % (ref 35.0–45.0)
Hemoglobin: 13.4 g/dL (ref 11.7–15.5)
MCH: 29.6 pg (ref 27.0–33.0)
MCHC: 32.9 g/dL (ref 32.0–36.0)
MCV: 89.8 fL (ref 80.0–100.0)
MPV: 12.3 fL (ref 7.5–12.5)
Platelets: 134 10*3/uL — ABNORMAL LOW (ref 140–400)
RBC: 4.53 10*6/uL (ref 3.80–5.10)
RDW: 14.1 % (ref 11.0–15.0)
WBC: 6.1 10*3/uL (ref 3.8–10.8)

## 2023-03-23 LAB — HEPATIC FUNCTION PANEL
AG Ratio: 1.7 (calc) (ref 1.0–2.5)
ALT: 36 U/L — ABNORMAL HIGH (ref 6–29)
AST: 28 U/L (ref 10–35)
Albumin: 4.4 g/dL (ref 3.6–5.1)
Alkaline phosphatase (APISO): 76 U/L (ref 37–153)
Bilirubin, Direct: 0.1 mg/dL (ref 0.0–0.2)
Globulin: 2.6 g/dL (calc) (ref 1.9–3.7)
Indirect Bilirubin: 0.3 mg/dL (calc) (ref 0.2–1.2)
Total Bilirubin: 0.4 mg/dL (ref 0.2–1.2)
Total Protein: 7 g/dL (ref 6.1–8.1)

## 2023-03-23 LAB — IGG, IGA, IGM
IgG (Immunoglobin G), Serum: 601 mg/dL (ref 600–1540)
IgM, Serum: 52 mg/dL (ref 50–300)
Immunoglobulin A: 505 mg/dL — ABNORMAL HIGH (ref 70–320)

## 2023-03-23 LAB — IRON, TOTAL/TOTAL IRON BINDING CAP
%SAT: 19 % (calc) (ref 16–45)
Iron: 78 ug/dL (ref 45–160)
TIBC: 406 mcg/dL (calc) (ref 250–450)

## 2023-03-23 LAB — CERULOPLASMIN: Ceruloplasmin: 25 mg/dL (ref 18–53)

## 2023-03-23 LAB — HEPATITIS PANEL, ACUTE
Hep A IgM: NONREACTIVE
Hep B C IgM: NONREACTIVE
Hepatitis B Surface Ag: NONREACTIVE
Hepatitis C Ab: NONREACTIVE

## 2023-03-23 LAB — AFP TUMOR MARKER: AFP-Tumor Marker: 2 ng/mL

## 2023-03-23 LAB — ANTI-SMOOTH MUSCLE ANTIBODY, IGG: Actin (Smooth Muscle) Antibody (IGG): <20 U (ref <20)

## 2023-03-23 LAB — MITOCHONDRIAL ANTIBODIES: Mitochondrial M2 Ab, IgG: <=20 U (ref <=20.0)

## 2023-03-23 LAB — ANA: Anti Nuclear Antibody (ANA): POSITIVE — AB

## 2023-03-23 LAB — ALPHA-1-ANTITRYPSIN: A-1 Antitrypsin, Ser: 115 mg/dL (ref 83–199)

## 2023-04-11 ENCOUNTER — Telehealth (INDEPENDENT_AMBULATORY_CARE_PROVIDER_SITE_OTHER): Payer: Self-pay | Admitting: Gastroenterology

## 2023-04-11 NOTE — Telephone Encounter (Signed)
Pt left voicemail stating she needs a later time for procedure. Pt originally scheduled for 04/22/23.  Called pt back and rescheduled her to 05/10/23 @ 11:15. Will send updated instructions. Will send message to endo also.

## 2023-04-19 ENCOUNTER — Encounter (HOSPITAL_COMMUNITY): Payer: Medicare Other

## 2023-05-02 NOTE — Telephone Encounter (Signed)
Pt left voicemail in regards to when pre op is scheduled.   Pt contacted and made aware that pre op is scheduled 05/06/23 in person at [facility] 11:00am

## 2023-05-04 NOTE — Patient Instructions (Addendum)
Amanda Carlson  05/04/2023     @PREFPERIOPPHARMACY @   Your procedure is scheduled on  05/10/2023.   Report to [facility] at  0715  A.M.   Call this number if you have problems the morning of surgery:  [phone]  If you experience any cold or flu symptoms such as cough, fever, chills, shortness of breath, etc. between now and your scheduled surgery, please notify us at the above number.   Remember:  Follow the diet instructions given to you by the office.   Your last dose of jardiance should be on 05/06/2023.    Take these medicines the morning of surgery with A SIP OF WATER                    xanax (if needed), levothyroxine.     Do not wear jewelry, make-up or nail polish.  Do not wear lotions, powders, or perfumes, or deodorant.  Do not shave 48 hours prior to surgery.  Men may shave face and neck.  Do not bring valuables to the hospital.  [facility] is not responsible for any belongings or valuables.  Contacts, dentures or bridgework may not be worn into surgery.  Leave your suitcase in the car.  After surgery it may be brought to your room.  For patients admitted to the hospital, discharge time will be determined by your treatment team.  Patients discharged the day of surgery will not be allowed to drive home and must have someone with them for 24 hours.    Special instructions:   DO NOT smoke tobacco or vape for 24 hours before your procedure.  Please read over the following fact sheets that you were given. Anesthesia Post-op Instructions and Care and Recovery After Surgery      Upper Endoscopy, Adult, Care After After the procedure, it is common to have a sore throat. It is also common to have: Mild stomach pain or discomfort. Bloating. Nausea. Follow these instructions at home: The instructions below may help you care for yourself at home. Your health care provider may give you more instructions. If you have questions, ask your health care  provider. If you were given a sedative during the procedure, it can affect you for several hours. Do not drive or operate machinery until your health care provider says that it is safe. If you will be going home right after the procedure, plan to have a responsible adult: Take you home from the hospital or clinic. You will not be allowed to drive. Care for you for the time you are told. Follow instructions from your health care provider about what you may eat and drink. Return to your normal activities as told by your health care provider. Ask your health care provider what activities are safe for you. Take over-the-counter and prescription medicines only as told by your health care provider. Contact a health care provider if you: Have a sore throat that lasts longer than one day. Have trouble swallowing. Have a fever. Get help right away if you: Vomit blood or your vomit looks like coffee grounds. Have bloody, black, or tarry stools. Have a very bad sore throat or you cannot swallow. Have difficulty breathing or very bad pain in your chest or abdomen. These symptoms may be an emergency. Get help right away. Call 911. Do not wait to see if the symptoms will go away. Do not drive yourself to the hospital. Summary After the procedure,  it is common to have a sore throat, mild stomach discomfort, bloating, and nausea. If you were given a sedative during the procedure, it can affect you for several hours. Do not drive until your health care provider says that it is safe. Follow instructions from your health care provider about what you may eat and drink. Return to your normal activities as told by your health care provider. This information is not intended to replace advice given to you by your health care provider. Make sure you discuss any questions you have with your health care provider. Document Revised: 03/17/2022 Document Reviewed: 03/17/2022 Elsevier Patient Education  2023 Elsevier  Inc. Monitored Anesthesia Care, Care After The following information offers guidance on how to care for yourself after your procedure. Your health care provider may also give you more specific instructions. If you have problems or questions, contact your health care provider. What can I expect after the procedure? After the procedure, it is common to have: Tiredness. Little or no memory about what happened during or after the procedure. Impaired judgment when it comes to making decisions. Nausea or vomiting. Some trouble with balance. Follow these instructions at home: For the time period you were told by your health care provider:  Rest. Do not participate in activities where you could fall or become injured. Do not drive or use machinery. Do not drink alcohol. Do not take sleeping pills or medicines that cause drowsiness. Do not make important decisions or sign legal documents. Do not take care of children on your own. Medicines Take over-the-counter and prescription medicines only as told by your health care provider. If you were prescribed antibiotics, take them as told by your health care provider. Do not stop using the antibiotic even if you start to feel better. Eating and drinking Follow instructions from your health care provider about what you may eat and drink. Drink enough fluid to keep your urine pale yellow. If you vomit: Drink clear fluids slowly and in small amounts as you are able. Clear fluids include water, ice chips, low-calorie sports drinks, and fruit juice that has water added to it (diluted fruit juice). Eat light and bland foods in small amounts as you are able. These foods include bananas, applesauce, rice, lean meats, toast, and crackers. General instructions  Have a responsible adult stay with you for the time you are told. It is important to have someone help care for you until you are awake and alert. If you have sleep apnea, surgery and some medicines  can increase your risk for breathing problems. Follow instructions from your health care provider about wearing your sleep device: When you are sleeping. This includes during daytime naps. While taking prescription pain medicines, sleeping medicines, or medicines that make you drowsy. Do not use any products that contain nicotine or tobacco. These products include cigarettes, chewing tobacco, and vaping devices, such as e-cigarettes. If you need help quitting, ask your health care provider. Contact a health care provider if: You feel nauseous or vomit every time you eat or drink. You feel light-headed. You are still sleepy or having trouble with balance after 24 hours. You get a rash. You have a fever. You have redness or swelling around the IV site. Get help right away if: You have trouble breathing. You have new confusion after you get home. These symptoms may be an emergency. Get help right away. Call 911. Do not wait to see if the symptoms will go away. Do not drive yourself to  the hospital. This information is not intended to replace advice given to you by your health care provider. Make sure you discuss any questions you have with your health care provider. Document Revised: 05/03/2022 Document Reviewed: 05/03/2022 Elsevier Patient Education  [place].

## 2023-05-05 DIAGNOSIS — E1165 Type 2 diabetes mellitus with hyperglycemia: Secondary | ICD-10-CM | POA: Diagnosis not present

## 2023-05-05 DIAGNOSIS — E039 Hypothyroidism, unspecified: Secondary | ICD-10-CM | POA: Diagnosis not present

## 2023-05-06 ENCOUNTER — Encounter (HOSPITAL_COMMUNITY)
Admission: RE | Admit: 2023-05-06 | Discharge: 2023-05-06 | Disposition: A | Discharge status: Home / Self Care | Payer: Medicare Other | Source: Ambulatory Visit | Attending: Gastroenterology | Admitting: Gastroenterology

## 2023-05-06 ENCOUNTER — Encounter (HOSPITAL_COMMUNITY): Payer: Self-pay

## 2023-05-06 VITALS — BP 119/63 | HR 84 | Temp 97.7°F | Resp 18 | Ht 67.0 in | Wt 169.1 lb

## 2023-05-06 DIAGNOSIS — Z01818 Encounter for other preprocedural examination: Secondary | ICD-10-CM | POA: Diagnosis not present

## 2023-05-06 DIAGNOSIS — K7469 Other cirrhosis of liver: Secondary | ICD-10-CM | POA: Diagnosis not present

## 2023-05-06 DIAGNOSIS — I10 Essential (primary) hypertension: Secondary | ICD-10-CM | POA: Diagnosis not present

## 2023-05-06 HISTORY — DX: Cardiac murmur, unspecified: R01.1

## 2023-05-06 LAB — COMPREHENSIVE METABOLIC PANEL
ALT: 41 U/L (ref 0–44)
AST: 45 U/L — ABNORMAL HIGH (ref 15–41)
Albumin: 4.2 g/dL (ref 3.5–5.0)
Alkaline Phosphatase: 75 U/L (ref 38–126)
Anion gap: 14 (ref 5–15)
BUN: 16 mg/dL (ref 8–23)
CO2: 22 mmol/L (ref 22–32)
Calcium: 9.1 mg/dL (ref 8.9–10.3)
Chloride: 101 mmol/L (ref 98–111)
Creatinine, Ser: 0.54 mg/dL (ref 0.44–1.00)
GFR, Estimated: >60 mL/min (ref >60)
Glucose, Bld: 124 mg/dL — ABNORMAL HIGH (ref 70–99)
Potassium: 3.7 mmol/L (ref 3.5–5.1)
Sodium: 137 mmol/L (ref 135–145)
Total Bilirubin: 0.8 mg/dL (ref 0.3–1.2)
Total Protein: 7.3 g/dL (ref 6.5–8.1)

## 2023-05-06 LAB — PROTIME-INR
INR: 1 (ref 0.8–1.2)
Prothrombin Time: 13.8 seconds (ref 11.4–15.2)

## 2023-05-10 ENCOUNTER — Ambulatory Visit (HOSPITAL_BASED_OUTPATIENT_CLINIC_OR_DEPARTMENT_OTHER): Payer: Medicare Other | Admitting: Anesthesiology

## 2023-05-10 ENCOUNTER — Ambulatory Visit (HOSPITAL_COMMUNITY): Payer: Medicare Other | Admitting: Anesthesiology

## 2023-05-10 ENCOUNTER — Ambulatory Visit (HOSPITAL_COMMUNITY)
Admission: RE | Admit: 2023-05-10 | Discharge: 2023-05-10 | Disposition: A | Discharge status: Home / Self Care | Payer: Medicare Other | Source: Ambulatory Visit | Attending: Gastroenterology | Admitting: Gastroenterology

## 2023-05-10 ENCOUNTER — Encounter (HOSPITAL_COMMUNITY): Payer: Self-pay | Admitting: Gastroenterology

## 2023-05-10 ENCOUNTER — Encounter (HOSPITAL_COMMUNITY)
Admission: RE | Disposition: A | Discharge status: Home / Self Care | Payer: Self-pay | Source: Ambulatory Visit | Attending: Gastroenterology

## 2023-05-10 DIAGNOSIS — Z85810 Personal history of malignant neoplasm of tongue: Secondary | ICD-10-CM | POA: Insufficient documentation

## 2023-05-10 DIAGNOSIS — G4730 Sleep apnea, unspecified: Secondary | ICD-10-CM | POA: Insufficient documentation

## 2023-05-10 DIAGNOSIS — K449 Diaphragmatic hernia without obstruction or gangrene: Secondary | ICD-10-CM | POA: Diagnosis not present

## 2023-05-10 DIAGNOSIS — K3189 Other diseases of stomach and duodenum: Secondary | ICD-10-CM | POA: Insufficient documentation

## 2023-05-10 DIAGNOSIS — J45909 Unspecified asthma, uncomplicated: Secondary | ICD-10-CM | POA: Insufficient documentation

## 2023-05-10 DIAGNOSIS — K319 Disease of stomach and duodenum, unspecified: Secondary | ICD-10-CM | POA: Diagnosis not present

## 2023-05-10 DIAGNOSIS — I8510 Secondary esophageal varices without bleeding: Secondary | ICD-10-CM

## 2023-05-10 DIAGNOSIS — K259 Gastric ulcer, unspecified as acute or chronic, without hemorrhage or perforation: Secondary | ICD-10-CM | POA: Diagnosis not present

## 2023-05-10 DIAGNOSIS — I10 Essential (primary) hypertension: Secondary | ICD-10-CM | POA: Insufficient documentation

## 2023-05-10 DIAGNOSIS — K589 Irritable bowel syndrome without diarrhea: Secondary | ICD-10-CM | POA: Diagnosis not present

## 2023-05-10 DIAGNOSIS — K7460 Unspecified cirrhosis of liver: Secondary | ICD-10-CM

## 2023-05-10 DIAGNOSIS — K219 Gastro-esophageal reflux disease without esophagitis: Secondary | ICD-10-CM | POA: Diagnosis not present

## 2023-05-10 DIAGNOSIS — E039 Hypothyroidism, unspecified: Secondary | ICD-10-CM | POA: Insufficient documentation

## 2023-05-10 DIAGNOSIS — E1149 Type 2 diabetes mellitus with other diabetic neurological complication: Secondary | ICD-10-CM | POA: Diagnosis not present

## 2023-05-10 DIAGNOSIS — Z79890 Hormone replacement therapy (postmenopausal): Secondary | ICD-10-CM | POA: Insufficient documentation

## 2023-05-10 DIAGNOSIS — Z79899 Other long term (current) drug therapy: Secondary | ICD-10-CM | POA: Diagnosis not present

## 2023-05-10 DIAGNOSIS — Z7984 Long term (current) use of oral hypoglycemic drugs: Secondary | ICD-10-CM | POA: Diagnosis not present

## 2023-05-10 DIAGNOSIS — K922 Gastrointestinal hemorrhage, unspecified: Secondary | ICD-10-CM | POA: Diagnosis not present

## 2023-05-10 DIAGNOSIS — E119 Type 2 diabetes mellitus without complications: Secondary | ICD-10-CM | POA: Insufficient documentation

## 2023-05-10 DIAGNOSIS — K7469 Other cirrhosis of liver: Secondary | ICD-10-CM

## 2023-05-10 HISTORY — PX: BIOPSY: SHX5522

## 2023-05-10 HISTORY — PX: ESOPHAGOGASTRODUODENOSCOPY (EGD) WITH PROPOFOL: SHX5813

## 2023-05-10 LAB — GLUCOSE, CAPILLARY: Glucose-Capillary: 147 mg/dL — ABNORMAL HIGH (ref 70–99)

## 2023-05-10 SURGERY — ESOPHAGOGASTRODUODENOSCOPY (EGD) WITH PROPOFOL
Anesthesia: General

## 2023-05-10 MED ORDER — LIDOCAINE VISCOUS HCL 2 % MT SOLN
15.0000 mL | Freq: Once | OROMUCOSAL | Status: AC
  Administered 2023-05-10: 15 mL via OROMUCOSAL

## 2023-05-10 MED ORDER — PHENYLEPHRINE 80 MCG/ML (10ML) SYRINGE FOR IV PUSH (FOR BLOOD PRESSURE SUPPORT)
PREFILLED_SYRINGE | INTRAVENOUS | Status: AC
  Filled 2023-05-10: qty 10

## 2023-05-10 MED ORDER — PROPOFOL 10 MG/ML IV BOLUS
INTRAVENOUS | Status: DC | PRN
  Administered 2023-05-10: 100 mg via INTRAVENOUS

## 2023-05-10 MED ORDER — PHENYLEPHRINE 80 MCG/ML (10ML) SYRINGE FOR IV PUSH (FOR BLOOD PRESSURE SUPPORT)
PREFILLED_SYRINGE | INTRAVENOUS | Status: DC | PRN
  Administered 2023-05-10: 160 ug via INTRAVENOUS

## 2023-05-10 MED ORDER — PROPOFOL 500 MG/50ML IV EMUL
INTRAVENOUS | Status: DC | PRN
  Administered 2023-05-10: 150 ug/kg/min via INTRAVENOUS

## 2023-05-10 MED ORDER — LIDOCAINE HCL (CARDIAC) PF 100 MG/5ML IV SOSY
PREFILLED_SYRINGE | INTRAVENOUS | Status: DC | PRN
  Administered 2023-05-10: 50 mg via INTRAVENOUS

## 2023-05-10 MED ORDER — LIDOCAINE VISCOUS HCL 2 % MT SOLN
OROMUCOSAL | Status: AC
  Filled 2023-05-10: qty 15

## 2023-05-10 MED ORDER — LACTATED RINGERS IV SOLN: INTRAVENOUS | Status: DC

## 2023-05-10 MED ORDER — CARVEDILOL 3.125 MG PO TABS: 3.1250 mg | ORAL_TABLET | Freq: Two times a day (BID) | ORAL | 1 refills | Status: DC

## 2023-05-10 NOTE — Discharge Instructions (Addendum)
You are being discharged to home.  Resume your previous diet.  We are waiting for your pathology results.  Do not take any aspirin, ibuprofen (including Advil, Motrin or Nuprin), naproxen (including Aleve), or any other non-steroidal anti-inflammatory drugs.  Start carvedilol 3.125 mg BID.

## 2023-05-10 NOTE — Anesthesia Postprocedure Evaluation (Signed)
Anesthesia Post Note  Patient: Amanda Carlson  Procedure(s) Performed: ESOPHAGOGASTRODUODENOSCOPY (EGD) WITH PROPOFOL BIOPSY  Patient location during evaluation: Phase II Anesthesia Type: General Level of consciousness: awake and alert and oriented Pain management: pain level controlled Vital Signs Assessment: post-procedure vital signs reviewed and stable Respiratory status: spontaneous breathing, nonlabored ventilation and respiratory function stable Cardiovascular status: blood pressure returned to baseline and stable Postop Assessment: no apparent nausea or vomiting Anesthetic complications: no  No notable events documented.   Last Vitals:  Vitals:   05/10/23 1001 05/10/23 1128  BP: 123/80 (!) 98/50  Pulse: 81 66  Resp: (!) 9 17  Temp:  (!) 36.3 C  SpO2: 97% 97%    Last Pain:  Vitals:   05/10/23 1128  TempSrc: Oral  PainSc: 0-No pain                 Hila Bolding C Chanah Tidmore

## 2023-05-10 NOTE — Anesthesia Procedure Notes (Signed)
Date/Time: 05/10/2023 11:18 AM  Performed by: Julian Reil, CRNAPre-anesthesia Checklist: Patient identified, Emergency Drugs available, Suction available and Patient being monitored Patient Re-evaluated:Patient Re-evaluated prior to induction Oxygen Delivery Method: Nasal cannula Induction Type: IV induction Placement Confirmation: positive ETCO2

## 2023-05-10 NOTE — H&P (Signed)
Amanda Carlson is an 77 y.o. female.   Chief Complaint: esophageal variceal screening. HPI: Amanda Carlson is a 77 y.o. female with past medical history of carpal tunnel, cirrhosis,  DM, GERD, hypothyroidism, asthma, IBS, sleep apnea, tongue cancer , coming for esophageal variceal screening.  The patient denies having any nausea, vomiting, fever, chills, hematochezia, melena, hematemesis, abdominal distention, abdominal pain, diarrhea, jaundice, pruritus or weight loss.   Past Medical History:  Diagnosis Date   ALLERGIC RHINITIS, SEASONAL 01/22/2011   Qualifier: Diagnosis of  By: Abner Greenspan MD, Stacey     Arthritis    Breast cancer screening 01/18/2014   Calcified Right side? Follows at [facility]    Bronchitis    hx of   Cervical cancer screening 04/11/2014   CTS (carpal tunnel syndrome) 06/07/2012   Diabetes mellitus type 2 in obese 06/29/2015   Diabetes mellitus without complication (HCC)    Difficulty swallowing    Liquids and foods at times   Family history of adverse reaction to anesthesia    Pts mother has a difficult time waking up after anesthesia   GERD 01/22/2011   Qualifier: Diagnosis of  By: Abner Greenspan MD, Stacey     Heart murmur    HYPOTHYROIDISM 01/22/2011   Qualifier: Diagnosis of  By: Abner Greenspan MD, Claria Dice with Dr Talmage Nap    Insomnia 06/07/2012   Intrinsic asthma, unspecified 01/22/2011   Qualifier: Diagnosis of  By: Abner Greenspan MD, Stacey     Irritable bowel syndrome 01/22/2011   Qualifier: Diagnosis of  By: Abner Greenspan MD, Stacey     Knee pain, bilateral 10/16/2011   Knee pain, right 10/16/2011   Lesion of breast 01/18/2014   Calcified Right side? 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  SCC of tongue.     THYROID CYST 01/22/2011   Qualifier: Diagnosis of  By: Abner Greenspan MD, Stacey     Tinea corporis 06/07/2012   Tinea corporis 02/22/2016   Tongue cancer (HCC)    Tongue lesion 01/18/2014    Past Surgical History:  Procedure Laterality Date   cervical fusion, discectomy, metal plate and 2 screws in place     CHOLECYSTECTOMY     EXCISION OF TONGUE LESION Left 07/11/2015   Procedure: EXCISION OF TONGUE LESION;  Surgeon: Suzanna Obey, MD;  Location: [facility] OR;  Service: ENT;  Laterality: Left;   KNEE ARTHROSCOPY     right   RADICAL NECK DISSECTION Left 07/11/2015   Procedure: RADICAL NECK DISSECTION;  Surgeon: Suzanna Obey, MD;  Location: [facility] OR;  Service: ENT;  Laterality: Left;  Left neck dissection and left tongue resection   THYROIDECTOMY, PARTIAL     TUBAL LIGATION      Family History  Problem Relation Age of Onset   Atrial fibrillation Mother    Other Mother        Back pain, CHF   Arthritis Mother    Osteoporosis Mother    Heart disease Father    Hypertension Father  Diabetes Father    Sleep apnea Father    Atrial fibrillation Father        paroxysmal   Cancer Father        skin cancers on face   Diabetes Brother    Diabetes Maternal Aunt        X 2 aunts   Cancer Maternal Grandmother    Diabetes Maternal Grandfather    Stroke Paternal Grandmother    Heart disease Paternal Grandfather    Stroke Paternal Grandfather    Social History:  reports that she has never smoked. She has never used smokeless tobacco. She reports that she does not drink alcohol and does not use drugs.  Allergies:  Allergies  Allergen Reactions   Codeine Itching   Tetanus Toxoid Swelling    Please do not give tetanus shot   Adhesive [Tape] Rash    Please use paper tape    Medications Prior to Admission  Medication Sig Dispense Refill    glucose blood (IGLUCOSE TEST STRIPS) test strip Check blood sugar once daily 100 each 12   Levothyroxine Sodium 125 MCG CAPS Take 125 mcg by mouth daily before breakfast.     Magnesium Salicylate (DOANS PILLS PO) Take by mouth. As needed     metFORMIN (GLUCOPHAGE-XR) 500 MG 24 hr tablet Take 500 mg by mouth. One at lunch, one a supper and 2 at bedtime     ONETOUCH DELICA LANCETS 33G MISC Use as directed once daily.  E11.9 100 each 1   OVER THE COUNTER MEDICATION Vit D3 1,000 iu daily Vit C 1,000 mg daily Vit E 180mg  2 a day Vit B12 1,000 mcg daily Biotin 10,000 mcg     OVER THE COUNTER MEDICATION Allergy relief one a day     Polyethyl Glycol-Propyl Glycol (SYSTANE OP) Apply to eye. As needed     simvastatin (ZOCOR) 40 MG tablet Take 40 mg by mouth at bedtime.     sodium chloride (OCEAN) 0.65 % SOLN nasal spray Place 1 spray into both nostrils as needed for congestion.     Vibegron (GEMTESA) 75 MG TABS Take by mouth.     ALPRAZolam (XANAX) 0.25 MG tablet TAKE 1 TABLET BY MOUTH EVERY DAY AS NEEDED FOR ANXIETY OR SLEEP 30 tablet 3   empagliflozin (JARDIANCE) 25 MG TABS tablet Take 25 mg by mouth daily.     naproxen (NAPROSYN) 500 MG tablet Take 500 mg by mouth. As needed     Vitamin D, Ergocalciferol, (DRISDOL) 50000 units CAPS capsule Take 1 capsule (50,000 Units total) by mouth every 7 (seven) days. Mondays 4 capsule 4   vitamin E 1000 UNIT capsule Take 1,000 Units by mouth daily.      Results for orders placed or performed during the hospital encounter of 05/10/23 (from the past 48 hour(s))  Glucose, capillary     Status: Abnormal   Collection Time: 05/10/23  9:37 AM  Result Value Ref Range   Glucose-Capillary 147 (H) 70 - 99 mg/dL    Comment: Glucose reference range applies only to samples taken after fasting for at least 8 hours.   No results found.  Review of Systems  All other systems reviewed and are negative.   Blood pressure 123/80, pulse 81, temperature 98.3 F (36.8 C),  resp. rate (!) 9, height 5\' 7"  (1.702 m), weight 76.7 kg, SpO2 97 %. Physical Exam  GENERAL: The patient is AO x3, in no acute distress. HEENT: Head is normocephalic and atraumatic. EOMI are  intact. Mouth is well hydrated and without lesions. NECK: Supple. No masses LUNGS: Clear to auscultation. No presence of rhonchi/wheezing/rales. Adequate chest expansion HEART: RRR, normal s1 and s2. ABDOMEN: Soft, nontender, no guarding, no peritoneal signs, and nondistended. BS +. No masses. EXTREMITIES: Without any cyanosis, clubbing, rash, lesions or edema. NEUROLOGIC: AOx3, no focal motor deficit. SKIN: no jaundice, no rashes  Assessment/Plan Amanda Carlson is a 77 y.o. female with past medical history of carpal tunnel, cirrhosis,  DM, GERD, hypothyroidism, asthma, IBS, sleep apnea, tongue cancer , coming for esophageal variceal screening.  Will proceed with EGD.  Dolores Frame, MD 05/10/2023, 10:16 AM

## 2023-05-10 NOTE — Anesthesia Preprocedure Evaluation (Addendum)
Anesthesia Evaluation  Patient identified by MRN, date of birth, ID band Patient awake    Reviewed: Allergy & Precautions, H&P , NPO status , Patient's Chart, lab work & pertinent test results  History of Anesthesia Complications (+) Family history of anesthesia reaction  Airway Mallampati: III  TM Distance: >3 FB Neck ROM: Full    Dental  (+) Dental Advisory Given, Caps,    Pulmonary shortness of breath and with exertion, asthma , sleep apnea and Continuous Positive Airway Pressure Ventilation    Pulmonary exam normal breath sounds clear to auscultation       Cardiovascular hypertension, Pt. on medications + Valvular Problems/Murmurs  Rhythm:Regular Rate:Normal + Systolic murmurs [id number] 10:51:57 [facility]-OPS ROUTINE RECORD [birth year] (77 yr) Female Caucasian Vent. rate 82 BPM PR interval 170 ms QRS duration 80 ms QT/QTcB 374/436 ms P-R-T axes 41 18 31 Normal sinus rhythm Low voltage QRS Borderline ECG When compared with ECG of 20-Jul-2017 10:50, No significant change was found Confirmed by Jodelle Red [phone]) on 05/08/2023 9:58:29 PM   Neuro/Psych  Neuromuscular disease  negative psych ROS   GI/Hepatic Neg liver ROS,GERD  Medicated,,  Endo/Other  diabetes, Well Controlled, Type 2, Oral Hypoglycemic AgentsHypothyroidism    Renal/GU negative Renal ROS  negative genitourinary   Musculoskeletal  (+) Arthritis , Osteoarthritis,    Abdominal   Peds negative pediatric ROS (+)  Hematology negative hematology ROS (+)   Anesthesia Other Findings   Reproductive/Obstetrics negative OB ROS                             Anesthesia Physical Anesthesia Plan  ASA: 3  Anesthesia Plan: General   Post-op Pain Management: Minimal or no pain anticipated   Induction: Intravenous  PONV Risk Score and Plan: Propofol infusion  Airway Management Planned: Nasal  Cannula and Natural Airway  Additional Equipment:   Intra-op Plan:   Post-operative Plan:   Informed Consent: I have reviewed the patients History and Physical, chart, labs and discussed the procedure including the risks, benefits and alternatives for the proposed anesthesia with the patient or authorized representative who has indicated his/her understanding and acceptance.     Dental advisory given  Plan Discussed with: CRNA and Surgeon  Anesthesia Plan Comments:         Anesthesia Quick Evaluation

## 2023-05-10 NOTE — Transfer of Care (Signed)
Immediate Anesthesia Transfer of Care Note  Patient: DEISHA MOSBY  Procedure(s) Performed: ESOPHAGOGASTRODUODENOSCOPY (EGD) WITH PROPOFOL BIOPSY  Patient Location: Short Stay  Anesthesia Type:General  Level of Consciousness: awake  Airway & Oxygen Therapy: Patient Spontanous Breathing  Post-op Assessment: Report given to RN and Post -op Vital signs reviewed and stable  Post vital signs: Reviewed and stable  Last Vitals:  Vitals Value Taken Time  BP    Temp    Pulse    Resp    SpO2      Last Pain:  Vitals:   05/10/23 1112  PainSc: 0-No pain         Complications: No notable events documented.

## 2023-05-10 NOTE — Op Note (Signed)
[facility] Patient Name: Amanda Carlson Procedure Date: 05/10/2023 11:06 AM MRN: [id number] Date of Birth: Dec 05, 1946 Attending MD: Katrinka Blazing , , [id number] CSN: [id number] Age: 77 Admit Type: Outpatient Procedure:                Upper GI endoscopy Indications:              Cirrhosis rule out esophageal varices Providers:                Katrinka Blazing, Buel Ream. Thomasena Edis RN, RN, Ina Homes, Technician Referring MD:              Medicines:                Monitored Anesthesia Care Complications:            No immediate complications. Estimated Blood Loss:     Estimated blood loss: none. Procedure:                Pre-Anesthesia Assessment:                           - Prior to the procedure, a History and Physical                            was performed, and patient medications, allergies                            and sensitivities were reviewed. The patient's                            tolerance of previous anesthesia was reviewed.                           - The risks and benefits of the procedure and the                            sedation options and risks were discussed with the                            patient. All questions were answered and informed                            consent was obtained.                           - ASA Grade Assessment: III - A patient with severe                            systemic disease.                           After obtaining informed consent, the endoscope was                            passed under direct vision. Throughout  the                            procedure, the patient's blood pressure, pulse, and                            oxygen saturations were monitored continuously. The                            [id number]-[id number] ([id number]) scope was introduced through the                            mouth, and advanced to the second part of duodenum.                            The upper GI endoscopy was  accomplished without                            difficulty. The patient tolerated the procedure                            well. Scope In: 11:16:39 AM Scope Out: 11:22:51 AM Total Procedure Duration: 0 hours 6 minutes 12 seconds  Findings:      Grade I and II varices were found in the lower third of the esophagus.      A 3 cm paraesophageal hernia was found.      A few localized diminutive erosions with stigmata of recent bleeding       were found in the gastric body and in the gastric antrum. Biopsies were       taken with a cold forceps for Helicobacter pylori testing.      The examined duodenum was normal. Impression:               - Grade I and II esophageal varices.                           - 3 cm paraesophageal hernia.                           - Erosive gastropathy with stigmata of recent                            bleeding. Biopsied.                           - Normal examined duodenum. Moderate Sedation:      Per Anesthesia Care Recommendation:           - Discharge patient to home (ambulatory).                           - Resume previous diet.                           - Await pathology results.                           -  No high dose aspirin, ibuprofen, naproxen, or                            other non-steroidal anti-inflammatory drugs.                           - Start carvedilol 3.125 mg BID. Procedure Code(s):        --- Professional ---                           [phone], Esophagogastroduodenoscopy, flexible,                            transoral; with biopsy, single or multiple Diagnosis Code(s):        --- Professional ---                           K74.60, Unspecified cirrhosis of liver                           I85.10, Secondary esophageal varices without                            bleeding                           K44.9, Diaphragmatic hernia without obstruction or                            gangrene                           K92.2, Gastrointestinal hemorrhage,  unspecified CPT copyright 2022 [employer] Association. All rights reserved. The codes documented in this report are preliminary and upon coder review may  be revised to meet current compliance requirements. Katrinka Blazing, MD Katrinka Blazing,  05/10/2023 11:31:35 AM This report has been signed electronically. Number of Addenda: 0

## 2023-05-12 LAB — SURGICAL PATHOLOGY

## 2023-05-13 ENCOUNTER — Encounter (HOSPITAL_COMMUNITY): Payer: Self-pay | Admitting: Gastroenterology

## 2023-05-13 ENCOUNTER — Other Ambulatory Visit (INDEPENDENT_AMBULATORY_CARE_PROVIDER_SITE_OTHER): Payer: Self-pay | Admitting: Gastroenterology

## 2023-05-13 DIAGNOSIS — K2970 Gastritis, unspecified, without bleeding: Secondary | ICD-10-CM

## 2023-05-13 DIAGNOSIS — J45909 Unspecified asthma, uncomplicated: Secondary | ICD-10-CM | POA: Diagnosis not present

## 2023-05-13 DIAGNOSIS — G4733 Obstructive sleep apnea (adult) (pediatric): Secondary | ICD-10-CM | POA: Diagnosis not present

## 2023-05-13 DIAGNOSIS — Z7409 Other reduced mobility: Secondary | ICD-10-CM | POA: Diagnosis not present

## 2023-05-13 DIAGNOSIS — E1169 Type 2 diabetes mellitus with other specified complication: Secondary | ICD-10-CM | POA: Diagnosis not present

## 2023-05-13 DIAGNOSIS — I10 Essential (primary) hypertension: Secondary | ICD-10-CM | POA: Diagnosis not present

## 2023-05-13 DIAGNOSIS — Z85810 Personal history of malignant neoplasm of tongue: Secondary | ICD-10-CM | POA: Diagnosis not present

## 2023-05-13 DIAGNOSIS — J302 Other seasonal allergic rhinitis: Secondary | ICD-10-CM | POA: Diagnosis not present

## 2023-05-13 DIAGNOSIS — E039 Hypothyroidism, unspecified: Secondary | ICD-10-CM | POA: Diagnosis not present

## 2023-05-13 DIAGNOSIS — F411 Generalized anxiety disorder: Secondary | ICD-10-CM | POA: Diagnosis not present

## 2023-05-13 DIAGNOSIS — K7460 Unspecified cirrhosis of liver: Secondary | ICD-10-CM | POA: Diagnosis not present

## 2023-05-13 DIAGNOSIS — E782 Mixed hyperlipidemia: Secondary | ICD-10-CM | POA: Diagnosis not present

## 2023-05-13 DIAGNOSIS — E559 Vitamin D deficiency, unspecified: Secondary | ICD-10-CM | POA: Diagnosis not present

## 2023-05-13 MED ORDER — OMEPRAZOLE 20 MG PO CPDR
20.0000 mg | DELAYED_RELEASE_CAPSULE | Freq: Every day | ORAL | 3 refills | Status: DC
Start: 2023-05-13 — End: 2023-05-17

## 2023-05-17 ENCOUNTER — Other Ambulatory Visit (INDEPENDENT_AMBULATORY_CARE_PROVIDER_SITE_OTHER): Payer: Self-pay

## 2023-05-17 ENCOUNTER — Telehealth (INDEPENDENT_AMBULATORY_CARE_PROVIDER_SITE_OTHER): Payer: Self-pay | Admitting: *Deleted

## 2023-05-17 DIAGNOSIS — K2970 Gastritis, unspecified, without bleeding: Secondary | ICD-10-CM

## 2023-05-17 MED ORDER — OMEPRAZOLE 20 MG PO CPDR
20.0000 mg | DELAYED_RELEASE_CAPSULE | Freq: Every day | ORAL | 3 refills | Status: DC
Start: 2023-05-17 — End: 2024-06-07

## 2023-05-17 NOTE — Telephone Encounter (Signed)
Patient left message stated Dr Levon Hedger called her with biopsy results and told her he was going to call in Prilosec but stated pharmacy didn't have it

## 2023-05-17 NOTE — Telephone Encounter (Signed)
Medication was sent to the [facility] specialty pharmacy, per patient request she wants this sent local [facility] [address] [place]. I have [facility] local [facility] as per patient request.

## 2023-05-25 ENCOUNTER — Ambulatory Visit (HOSPITAL_COMMUNITY)
Admission: RE | Admit: 2023-05-25 | Discharge: 2023-05-25 | Disposition: A | Discharge status: Home / Self Care | Payer: Medicare Other | Source: Ambulatory Visit | Attending: Gastroenterology | Admitting: Gastroenterology

## 2023-05-25 DIAGNOSIS — K7469 Other cirrhosis of liver: Secondary | ICD-10-CM

## 2023-05-25 DIAGNOSIS — R945 Abnormal results of liver function studies: Secondary | ICD-10-CM | POA: Diagnosis not present

## 2023-05-25 DIAGNOSIS — R7989 Other specified abnormal findings of blood chemistry: Secondary | ICD-10-CM | POA: Diagnosis not present

## 2023-05-25 DIAGNOSIS — Z9049 Acquired absence of other specified parts of digestive tract: Secondary | ICD-10-CM | POA: Diagnosis not present

## 2023-05-25 DIAGNOSIS — K7460 Unspecified cirrhosis of liver: Secondary | ICD-10-CM | POA: Diagnosis not present

## 2023-07-04 ENCOUNTER — Telehealth (INDEPENDENT_AMBULATORY_CARE_PROVIDER_SITE_OTHER): Payer: Self-pay | Admitting: *Deleted

## 2023-07-04 ENCOUNTER — Other Ambulatory Visit (INDEPENDENT_AMBULATORY_CARE_PROVIDER_SITE_OTHER): Payer: Self-pay | Admitting: *Deleted

## 2023-07-04 NOTE — Telephone Encounter (Signed)
Spoke to patient today, I explained that there was a theoretical interaction between PPIs and levothyroxine, as the change in the pH may decrease the absorption of levothyroxine.  However, if indicated she should take the PPI.  She denied having any symptoms and had very minimal erosions in her last EGD.  She can hold off on taking the PPI for now.

## 2023-07-04 NOTE — Telephone Encounter (Signed)
Pt called concerned because she has been taking omeprazole for a month or more and just read that omeprazole interacts with levothyroxine. She wonders if she should be changed to something else.   [phone]

## 2023-08-17 DIAGNOSIS — E039 Hypothyroidism, unspecified: Secondary | ICD-10-CM | POA: Diagnosis not present

## 2023-08-17 DIAGNOSIS — E1169 Type 2 diabetes mellitus with other specified complication: Secondary | ICD-10-CM | POA: Diagnosis not present

## 2023-08-17 DIAGNOSIS — R350 Frequency of micturition: Secondary | ICD-10-CM | POA: Diagnosis not present

## 2023-08-17 DIAGNOSIS — E559 Vitamin D deficiency, unspecified: Secondary | ICD-10-CM | POA: Diagnosis not present

## 2023-08-23 DIAGNOSIS — G4733 Obstructive sleep apnea (adult) (pediatric): Secondary | ICD-10-CM | POA: Diagnosis not present

## 2023-08-23 DIAGNOSIS — E782 Mixed hyperlipidemia: Secondary | ICD-10-CM | POA: Diagnosis not present

## 2023-08-23 DIAGNOSIS — E118 Type 2 diabetes mellitus with unspecified complications: Secondary | ICD-10-CM | POA: Diagnosis not present

## 2023-08-23 DIAGNOSIS — J302 Other seasonal allergic rhinitis: Secondary | ICD-10-CM | POA: Diagnosis not present

## 2023-08-23 DIAGNOSIS — I10 Essential (primary) hypertension: Secondary | ICD-10-CM | POA: Diagnosis not present

## 2023-08-23 DIAGNOSIS — F411 Generalized anxiety disorder: Secondary | ICD-10-CM | POA: Diagnosis not present

## 2023-08-23 DIAGNOSIS — J45909 Unspecified asthma, uncomplicated: Secondary | ICD-10-CM | POA: Diagnosis not present

## 2023-08-23 DIAGNOSIS — E1169 Type 2 diabetes mellitus with other specified complication: Secondary | ICD-10-CM | POA: Diagnosis not present

## 2023-08-23 DIAGNOSIS — Z7409 Other reduced mobility: Secondary | ICD-10-CM | POA: Diagnosis not present

## 2023-08-23 DIAGNOSIS — E039 Hypothyroidism, unspecified: Secondary | ICD-10-CM | POA: Diagnosis not present

## 2023-08-23 DIAGNOSIS — E559 Vitamin D deficiency, unspecified: Secondary | ICD-10-CM | POA: Diagnosis not present

## 2023-08-23 DIAGNOSIS — K7460 Unspecified cirrhosis of liver: Secondary | ICD-10-CM | POA: Diagnosis not present

## 2023-09-19 ENCOUNTER — Ambulatory Visit (INDEPENDENT_AMBULATORY_CARE_PROVIDER_SITE_OTHER): Payer: Medicare Other | Admitting: Gastroenterology

## 2023-10-06 ENCOUNTER — Other Ambulatory Visit (INDEPENDENT_AMBULATORY_CARE_PROVIDER_SITE_OTHER): Payer: Self-pay | Admitting: Gastroenterology

## 2023-10-07 NOTE — Telephone Encounter (Signed)
EGD on 05/10/23 Last OV 03/17/23

## 2023-10-25 ENCOUNTER — Encounter (INDEPENDENT_AMBULATORY_CARE_PROVIDER_SITE_OTHER): Payer: Self-pay | Admitting: Gastroenterology

## 2023-10-25 ENCOUNTER — Ambulatory Visit (INDEPENDENT_AMBULATORY_CARE_PROVIDER_SITE_OTHER): Payer: Medicare Other | Admitting: Gastroenterology

## 2023-10-25 VITALS — BP 100/67 | HR 78 | Temp 97.4°F | Ht 67.0 in | Wt 162.5 lb

## 2023-10-25 DIAGNOSIS — K7469 Other cirrhosis of liver: Secondary | ICD-10-CM

## 2023-10-25 DIAGNOSIS — R1030 Lower abdominal pain, unspecified: Secondary | ICD-10-CM

## 2023-10-25 DIAGNOSIS — K580 Irritable bowel syndrome with diarrhea: Secondary | ICD-10-CM

## 2023-10-25 DIAGNOSIS — R197 Diarrhea, unspecified: Secondary | ICD-10-CM | POA: Diagnosis not present

## 2023-10-25 DIAGNOSIS — K7460 Unspecified cirrhosis of liver: Secondary | ICD-10-CM

## 2023-10-25 NOTE — Patient Instructions (Addendum)
-   Reduce salt intake to <2 g per day - Can take Tylenol max of 2 g per day (650 mg q8h) for pain - Avoid NSAIDs for pain - Avoid eating raw oysters/shellfish - Ensure every night before going to sleep -Please continue omeprazole 20 mg daily -Please continue carvedilol 3.125 mg twice daily -I am going to check some stool studies and thyroid function to rule out other causes of her diarrhea. -You will be due for ultrasound of the liver in December which will call to schedule. -We will refer you back to [facility] rheumatology for elevation of one of your autoimmune factors as we discussed at your visit.  Follow-up 3 months

## 2023-10-25 NOTE — Progress Notes (Signed)
Referring Provider: Benita Stabile, MD Primary Care Physician:  Benita Stabile, MD Primary GI Physician: Dr. Levon Hedger   Chief Complaint  Patient presents with   Diarrhea    Follow up on diarrhea. Has diarrhea 1 - 3 times per day. Happens after eating. Going on for several months.    HPI:   Amanda Carlson is a 77 y.o. female with past medical history of carpal tunnel, DM, GERD, hypothyroidism, asthma, IBS, sleep apnea, tongue cancer   Patient presenting today for follow up of diarrhea and cirrhosis  Last seen march 2024, at that time presenting as a new patient for cirrhosis.  She also reported history of IBS with diarrhea which was well-controlled at that time.  Patient recommended to have liver serologies to rule out underlying causes of cirrhosis, AFP, INR, hepatic function panel and CBC.  Recommended repeat right upper quadrant ultrasound for HCC screening in June 2024.  Liver serologies WNL other than ANA 1:80/1:320, referred to rheumatology. 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 Has been having "explosive diarrhea" almost daily since then. Mostly having an episode of diarrhea after first two meals of the day. States she stopped the omeprazole for a few weeks to see but diarrhea persisted. She denies any other changes to meds besides the omeprazole and carvedilol as above. She takes amoxicillin for dental work, last dose was a few weeks ago. Stools can we loose to watery. No blood in stools or black stools. Appetite is good. She is trying to cut back some and lose some weight. She denies any history of constipation. She has some lower abdominal discomfort at times, noting the sensation feels more like she needs to go to the restroom. No nausea or  vomiting. Has upcoming labs with PCP to have TSH rechecked in December.   Denies episodes of confusion, no swelling to the abdomen.   She states she never saw rheumatology, per chart review, appears they tried to contact her x3 but were unable to get in touch with her.   Previous MELD 3.0--7  Cirrhosis related questions: Episodes of confusion/disorientation: no Taking diuretics? none Beta blockers? Coreg 3.125mg  BID  Prior history of variceal banding? No  Prior episodes of SBP? No  Last liver imaging: 05/2023 . Prior cholecystectomy. 2. Increased hepatic parenchymal echogenicity suggestive of steatosis. Mild nodular hepatic contour suggestive of cirrhosis. No focal lesion. Alcohol use: none    Negative cologuard 2023 Last Colonoscopy:12/2004  Last Endoscopy: 04/2023  - Grade I and II esophageal varices.                           - 3 cm paraesophageal hernia.                           - Erosive gastropathy with stigmata of recent                            bleeding. Biopsied.                           - Normal examined duodenum. Mild reactive gastropathy.  -  Negative for H. pylori on HE stain  - No intestinal metaplasia, dysplasia, or malignancy.  Recommendations:    Past Medical History:  Diagnosis Date   ALLERGIC RHINITIS, SEASONAL 01/22/2011   Qualifier: Diagnosis of  By: Abner Greenspan MD, Stacey     Arthritis    Breast cancer screening 01/18/2014   Calcified Right side? Follows at [facility]    Bronchitis    hx of   Cervical cancer screening 04/11/2014   CTS (carpal tunnel syndrome) 06/07/2012   Diabetes mellitus type 2 in obese 06/29/2015   Diabetes mellitus without complication (HCC)    Difficulty swallowing    Liquids and foods at times   Family history of adverse reaction to anesthesia    Pts mother has a difficult time waking up after anesthesia   GERD 01/22/2011   Qualifier: Diagnosis of  By: Abner Greenspan MD, Stacey     Heart murmur    HYPOTHYROIDISM 01/22/2011   Qualifier:  Diagnosis of  By: Abner Greenspan MD, Claria Dice with Dr Talmage Nap    Insomnia 06/07/2012   Intrinsic asthma, unspecified 01/22/2011   Qualifier: Diagnosis of  By: Abner Greenspan MD, Stacey     Irritable bowel syndrome 01/22/2011   Qualifier: Diagnosis of  By: Abner Greenspan MD, Stacey     Knee pain, bilateral 10/16/2011   Knee pain, right 10/16/2011   Lesion of breast 01/18/2014   Calcified Right side? 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  SCC of tongue.     THYROID CYST 01/22/2011   Qualifier: Diagnosis of  By: Abner Greenspan MD, Stacey     Tinea corporis 06/07/2012   Tinea corporis 02/22/2016   Tongue cancer (HCC)    Tongue lesion 01/18/2014    Past Surgical History:  Procedure Laterality Date   BIOPSY  05/10/2023   Procedure: BIOPSY;  Surgeon: Marguerita Merles, Reuel Boom, MD;  Location: [facility];  Service: Gastroenterology;;   cervical fusion, discectomy, metal plate and 2 screws in place     CHOLECYSTECTOMY     ESOPHAGOGASTRODUODENOSCOPY (EGD) WITH PROPOFOL N/A 05/10/2023   Procedure: ESOPHAGOGASTRODUODENOSCOPY (EGD) WITH PROPOFOL;  Surgeon: Dolores Frame, MD;  Location: [facility];  Service: Gastroenterology;  Laterality: N/A;  9:00am;asa 3, moved from 5/3 to 5/21 per Tanya   EXCISION OF TONGUE LESION Left 07/11/2015   Procedure: EXCISION OF TONGUE LESION;  Surgeon: Suzanna Obey, MD;  Location: [facility] OR;  Service: ENT;  Laterality: Left;   KNEE ARTHROSCOPY     right   RADICAL NECK DISSECTION Left 07/11/2015   Procedure: RADICAL NECK DISSECTION;  Surgeon: Suzanna Obey, MD;   Location: [facility] OR;  Service: ENT;  Laterality: Left;  Left neck dissection and left tongue resection   THYROIDECTOMY, PARTIAL     TUBAL LIGATION      Current Outpatient Medications  Medication Sig Dispense Refill   acetaminophen (TYLENOL) 325 MG tablet Take 650 mg by mouth every 6 (six) hours as needed.     ALPRAZolam (XANAX) 0.25 MG tablet TAKE 1  TABLET BY MOUTH EVERY DAY AS NEEDED FOR ANXIETY OR SLEEP 30 tablet 3   carvedilol (COREG) 3.125 MG tablet TAKE 1 TABLET BY MOUTH TWICE A DAY WITH A MEAL 180 tablet 1   empagliflozin (JARDIANCE) 25 MG TABS tablet Take 25 mg by mouth daily.     glucose blood (IGLUCOSE TEST STRIPS) test strip Check blood sugar once daily 100 each 12   Levothyroxine Sodium 125 MCG CAPS Take 125 mcg by mouth daily before breakfast.     metFORMIN (GLUCOPHAGE-XR) 500 MG 24 hr tablet Take 500 mg by mouth. One at lunch, one a supper and 2 at bedtime     omeprazole (PRILOSEC) 20 MG capsule Take 1 capsule (20 mg total) by mouth daily. 90 capsule 3   ONETOUCH DELICA LANCETS 33G MISC Use as directed once daily.  E11.9 100 each 1   OVER THE COUNTER MEDICATION Vit C 1,000 mg daily  Vit B12 1,000 mcg occasionaly  Biotin 10,000 mcg     OVER THE COUNTER MEDICATION Allergy relief one a day     Polyethyl Glycol-Propyl Glycol (SYSTANE OP) Apply to eye. As needed     simvastatin (ZOCOR) 40 MG tablet Take 40 mg by mouth at bedtime.     sodium chloride (OCEAN) 0.65 % SOLN nasal spray Place 1 spray into both nostrils as needed for congestion.     Vibegron (GEMTESA) 75 MG TABS Take by mouth daily.     Vitamin D, Ergocalciferol, (DRISDOL) 50000 units CAPS capsule Take 1 capsule (50,000 Units total) by mouth every 7 (seven) days. Mondays 4 capsule 4   No current facility-administered medications for this visit.    Allergies as of 10/25/2023 - Review Complete 10/25/2023  Allergen Reaction Noted   Codeine Itching 01/22/2011   Tetanus toxoid Swelling    Adhesive [tape] Rash 09/18/2013     Family History  Problem Relation Age of Onset   Atrial fibrillation Mother    Other Mother        Back pain, CHF   Arthritis Mother    Osteoporosis Mother    Heart disease Father    Hypertension Father    Diabetes Father    Sleep apnea Father    Atrial fibrillation Father        paroxysmal   Cancer Father        skin cancers on face   Diabetes Brother    Diabetes Maternal Aunt        X 2 aunts   Cancer Maternal Grandmother    Diabetes Maternal Grandfather    Stroke Paternal Grandmother    Heart disease Paternal Grandfather    Stroke Paternal Grandfather     Social History   Socioeconomic History   Marital status: Divorced    Spouse name: Not on file   Number of children: Not on file   Years of education: Not on file   Highest education level: Not on file  Occupational History   Not on file  Tobacco Use   Smoking status: Never   Smokeless tobacco: Never  Substance and Sexual Activity   Alcohol use: No    Alcohol/week: 0.0 standard drinks of alcohol    Comment: No intake of alcohol in greater than 10 yrs   Drug use: No   Sexual activity: Not on file  Other Topics Concern   Not on file  Social History Narrative   Not on file   Social Determinants of Health   Financial Resource Strain: Not on  file  Food Insecurity: Not on file  Transportation Needs: Not on file  Physical Activity: Not on file  Stress: Not on file  Social Connections: Not on file    Review of systems General: negative for malaise, night sweats, fever, chills, weight loss Neck: Negative for lumps, goiter, pain and significant neck swelling Resp: Negative for cough, wheezing, dyspnea at rest CV: Negative for chest pain, leg swelling, palpitations, orthopnea GI: denies melena, hematochezia, nausea, vomiting, constipation, dysphagia, odyonophagia, early satiety or unintentional weight loss. +diarrhea  MSK: Negative for joint pain or swelling, back pain, and muscle pain. Derm: Negative  for itching or rash Psych: Denies depression, anxiety, memory loss, confusion. No homicidal or suicidal ideation.  Heme: Negative for prolonged bleeding, bruising easily, and swollen nodes. Endocrine: Negative for cold or heat intolerance, polyuria, polydipsia and goiter. Neuro: negative for tremor, gait imbalance, syncope and seizures. The remainder of the review of systems is noncontributory.  Physical Exam: BP 100/67 (BP Location: Left Arm, Patient Position: Sitting, Cuff Size: Normal)   Pulse 78   Temp (!) 97.4 F (36.3 C) (Oral)   Ht 5\' 7"  (1.702 m)   Wt 162 lb 8 oz (73.7 kg)   BMI 25.45 kg/m  General:   Alert and oriented. No distress noted. Pleasant and cooperative.  Head:  Normocephalic and atraumatic. Eyes:  Conjuctiva clear without scleral icterus. Mouth:  Oral mucosa pink and moist. Good dentition. No lesions. Heart: Normal rate and rhythm, s1 and s2 heart sounds present.  Lungs: Clear lung sounds in all lobes. Respirations equal and unlabored. Abdomen:  +BS, soft, non-tender and non-distended. No rebound or guarding. No HSM or masses noted. Derm: No palmar erythema or jaundice Msk:  Symmetrical without gross deformities. Normal posture. Extremities:  Without edema. Neurologic:  Alert and  oriented x4 Psych:  Alert and cooperative. Normal mood and affect.  Invalid input(s): "6 MONTHS"   ASSESSMENT: Amanda Carlson is a 77 y.o. 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  She appears relatively well compensated.  EGD in August with grade 1 and grade 2 EV's, she was started on carvedilol 3.125 mg twice daily.  Her heart rate is not quite at goal today with a rate of 78, however BP 100/67 therefore no room to increase beta-blocker at this time.  She denies any lightheadedness or dizziness and seems to be tolerating beta-blocker well.  She is up-to-date on [facility] screening and will be due for repeat of this in  December.  She is due for MELD labs, CBC, AFP.  Notably she had an elevated ANA with workup of cirrhosis was initially being done, [facility] but appears they attempted to reach her and were unable to.  I discussed the importance of referral to rheumatology given elevated ANA, we will send another referral for this today.  Diarrhea: History of IBS with diarrhea that this has been well-controlled over the past few years with diet and lifestyle modifications.  She notes sudden onset of diarrhea just prior to EGD.  Denies any medication changes prior to onset, no recent antibiotics.  No rectal bleeding or melena.  She does have some lower abdominal pain at times.  Last colonoscopy was in 2006 as she had Cologuard in 2023 that was negative.  Will check TSH, GI pathogen panel, C. difficile and celiac panel.  May need to consider updating colonoscopy if diarrhea persist and workup is unremarkable.  PLAN:  -  HCC screening via [facility] [place] December  - MELD labs, CBC, AFP  - TSH, GI pathogen panel, C diff, celiac - Continue carvedilol 3.125mg  BID  -Continue omprazole 20mg  daily - Reduce salt intake to <2 g per day - Can take Tylenol max of 2 g per day (650 mg q8h) for pain - Avoid NSAIDs for pain - Avoid eating raw oysters/shellfish - Ensure every night before going to sleep  All questions were answered, patient verbalized understanding and is in agreement with plan as outlined above.   Follow Up: 3 months   Brogan Martis L. Jeanmarie Hubert, MSN, APRN, AGNP-C Adult-Gerontology Nurse Practitioner [facility]  I have reviewed the note and agree with the APP's assessment as described in this progress note  Katrinka Blazing, MD Gastroenterology and Hepatology [facility]astroenter[facility]

## 2023-10-26 LAB — CBC
HCT: 42.1 % (ref 35.0–45.0)
Hemoglobin: 13.3 g/dL (ref 11.7–15.5)
MCH: 29.1 pg (ref 27.0–33.0)
MCHC: 31.6 g/dL — ABNORMAL LOW (ref 32.0–36.0)
MCV: 92.1 fL (ref 80.0–100.0)
MPV: 11 fL (ref 7.5–12.5)
Platelets: 146 10*3/uL (ref 140–400)
RBC: 4.57 10*6/uL (ref 3.80–5.10)
RDW: 14 % (ref 11.0–15.0)
WBC: 6 10*3/uL (ref 3.8–10.8)

## 2023-10-26 LAB — COMPREHENSIVE METABOLIC PANEL
AG Ratio: 1.7 (calc) (ref 1.0–2.5)
ALT: 46 U/L — ABNORMAL HIGH (ref 6–29)
AST: 34 U/L (ref 10–35)
Albumin: 4.5 g/dL (ref 3.6–5.1)
Alkaline phosphatase (APISO): 81 U/L (ref 37–153)
BUN/Creatinine Ratio: 30 (calc) — ABNORMAL HIGH (ref 6–22)
BUN: 17 mg/dL (ref 7–25)
CO2: 27 mmol/L (ref 20–32)
Calcium: 10.3 mg/dL (ref 8.6–10.4)
Chloride: 103 mmol/L (ref 98–110)
Creat: 0.56 mg/dL — ABNORMAL LOW (ref 0.60–1.00)
Globulin: 2.6 g/dL (ref 1.9–3.7)
Glucose, Bld: 150 mg/dL — ABNORMAL HIGH (ref 65–99)
Potassium: 4.3 mmol/L (ref 3.5–5.3)
Sodium: 142 mmol/L (ref 135–146)
Total Bilirubin: 0.4 mg/dL (ref 0.2–1.2)
Total Protein: 7.1 g/dL (ref 6.1–8.1)

## 2023-10-26 LAB — TSH: TSH: 0.57 m[IU]/L (ref 0.40–4.50)

## 2023-10-26 LAB — PROTIME-INR
INR: 1
Prothrombin Time: 11 s (ref 9.0–11.5)

## 2023-10-26 LAB — AFP TUMOR MARKER: AFP-Tumor Marker: 1.8 ng/mL

## 2023-10-27 LAB — CELIAC DISEASE PANEL
(tTG) Ab, IgA: <1 U/mL
(tTG) Ab, IgG: 2.6 U/mL
Gliadin IgA: 1 U/mL
Gliadin IgG: <1 U/mL
Immunoglobulin A: 488 mg/dL — ABNORMAL HIGH (ref 70–320)

## 2023-11-01 ENCOUNTER — Other Ambulatory Visit (HOSPITAL_COMMUNITY): Payer: Self-pay | Admitting: Internal Medicine

## 2023-11-01 ENCOUNTER — Telehealth (INDEPENDENT_AMBULATORY_CARE_PROVIDER_SITE_OTHER): Payer: Self-pay | Admitting: *Deleted

## 2023-11-01 DIAGNOSIS — K7469 Other cirrhosis of liver: Secondary | ICD-10-CM | POA: Diagnosis not present

## 2023-11-01 DIAGNOSIS — R1030 Lower abdominal pain, unspecified: Secondary | ICD-10-CM | POA: Diagnosis not present

## 2023-11-01 DIAGNOSIS — R197 Diarrhea, unspecified: Secondary | ICD-10-CM | POA: Diagnosis not present

## 2023-11-01 DIAGNOSIS — Z1231 Encounter for screening mammogram for malignant neoplasm of breast: Secondary | ICD-10-CM

## 2023-11-01 DIAGNOSIS — A09 Infectious gastroenteritis and colitis, unspecified: Secondary | ICD-10-CM | POA: Diagnosis not present

## 2023-11-01 NOTE — Telephone Encounter (Signed)
Spoke to scheduler at [facility] regarding status of referral - the new patient coordinator called patient on 10/26/23 to offer apt - patient declined apt at this time and said she would call back at a later date

## 2023-11-03 ENCOUNTER — Other Ambulatory Visit (INDEPENDENT_AMBULATORY_CARE_PROVIDER_SITE_OTHER): Payer: Self-pay | Admitting: *Deleted

## 2023-11-03 DIAGNOSIS — R197 Diarrhea, unspecified: Secondary | ICD-10-CM

## 2023-11-03 LAB — C. DIFFICILE GDH AND TOXIN A/B
GDH ANTIGEN: NOT DETECTED
MICRO NUMBER:: 15722471
SPECIMEN QUALITY:: ADEQUATE
TOXIN A AND B: NOT DETECTED

## 2023-11-03 LAB — GASTROINTESTINAL PATHOGEN PNL
CampyloBacter Group: NOT DETECTED
Norovirus GI/GII: NOT DETECTED
Rotavirus A: NOT DETECTED
Salmonella species: NOT DETECTED
Shiga Toxin 1: NOT DETECTED
Shiga Toxin 2: NOT DETECTED
Shigella Species: NOT DETECTED
Vibrio Group: NOT DETECTED
Yersinia enterocolitica: NOT DETECTED

## 2023-11-07 DIAGNOSIS — R197 Diarrhea, unspecified: Secondary | ICD-10-CM | POA: Diagnosis not present

## 2023-11-15 ENCOUNTER — Other Ambulatory Visit (INDEPENDENT_AMBULATORY_CARE_PROVIDER_SITE_OTHER): Payer: Self-pay | Admitting: Gastroenterology

## 2023-11-15 LAB — FECAL FAT, QUALITATIVE: FECAL FAT, QUALITATIVE: NORMAL

## 2023-11-15 LAB — PANCREATIC ELASTASE, FECAL: Pancreatic Elastase-1, Stool: 178 ug/g — ABNORMAL LOW (ref >200)

## 2023-11-15 MED ORDER — PANCRELIPASE (LIP-PROT-AMYL) 36000-114000 UNITS PO CPEP: ORAL_CAPSULE | ORAL | 11 refills | Status: DC

## 2023-11-23 ENCOUNTER — Ambulatory Visit (HOSPITAL_COMMUNITY)
Admission: RE | Admit: 2023-11-23 | Discharge: 2023-11-23 | Disposition: A | Discharge status: Home / Self Care | Payer: Medicare Other | Source: Ambulatory Visit | Attending: Internal Medicine | Admitting: Internal Medicine

## 2023-11-23 ENCOUNTER — Ambulatory Visit (HOSPITAL_COMMUNITY)
Admission: RE | Admit: 2023-11-23 | Discharge: 2023-11-23 | Disposition: A | Discharge status: Home / Self Care | Payer: Medicare Other | Source: Ambulatory Visit | Attending: Gastroenterology | Admitting: Gastroenterology

## 2023-11-23 ENCOUNTER — Encounter (HOSPITAL_COMMUNITY): Payer: Self-pay

## 2023-11-23 DIAGNOSIS — Z1231 Encounter for screening mammogram for malignant neoplasm of breast: Secondary | ICD-10-CM | POA: Diagnosis not present

## 2023-11-23 DIAGNOSIS — Z9049 Acquired absence of other specified parts of digestive tract: Secondary | ICD-10-CM | POA: Diagnosis not present

## 2023-11-23 DIAGNOSIS — R932 Abnormal findings on diagnostic imaging of liver and biliary tract: Secondary | ICD-10-CM | POA: Diagnosis not present

## 2023-11-23 DIAGNOSIS — K7469 Other cirrhosis of liver: Secondary | ICD-10-CM | POA: Diagnosis not present

## 2023-11-29 DIAGNOSIS — E119 Type 2 diabetes mellitus without complications: Secondary | ICD-10-CM | POA: Diagnosis not present

## 2023-12-19 DIAGNOSIS — E1165 Type 2 diabetes mellitus with hyperglycemia: Secondary | ICD-10-CM | POA: Diagnosis not present

## 2023-12-19 DIAGNOSIS — E1169 Type 2 diabetes mellitus with other specified complication: Secondary | ICD-10-CM | POA: Diagnosis not present

## 2023-12-19 DIAGNOSIS — E039 Hypothyroidism, unspecified: Secondary | ICD-10-CM | POA: Diagnosis not present

## 2023-12-19 DIAGNOSIS — Z23 Encounter for immunization: Secondary | ICD-10-CM | POA: Diagnosis not present

## 2023-12-19 DIAGNOSIS — E559 Vitamin D deficiency, unspecified: Secondary | ICD-10-CM | POA: Diagnosis not present

## 2024-01-04 ENCOUNTER — Other Ambulatory Visit (INDEPENDENT_AMBULATORY_CARE_PROVIDER_SITE_OTHER): Payer: Self-pay | Admitting: Gastroenterology

## 2024-01-04 DIAGNOSIS — R1030 Lower abdominal pain, unspecified: Secondary | ICD-10-CM

## 2024-01-24 ENCOUNTER — Encounter (INDEPENDENT_AMBULATORY_CARE_PROVIDER_SITE_OTHER): Payer: Self-pay | Admitting: Gastroenterology

## 2024-01-24 ENCOUNTER — Ambulatory Visit (INDEPENDENT_AMBULATORY_CARE_PROVIDER_SITE_OTHER): Payer: Medicare PPO | Admitting: Gastroenterology

## 2024-01-24 VITALS — BP 105/70 | HR 65 | Temp 97.2°F | Ht 67.0 in | Wt 162.2 lb

## 2024-01-24 DIAGNOSIS — R197 Diarrhea, unspecified: Secondary | ICD-10-CM

## 2024-01-24 DIAGNOSIS — K8681 Exocrine pancreatic insufficiency: Secondary | ICD-10-CM | POA: Insufficient documentation

## 2024-01-24 DIAGNOSIS — K7469 Other cirrhosis of liver: Secondary | ICD-10-CM

## 2024-01-24 NOTE — Progress Notes (Addendum)
 Referring Provider: Shona Norleen PEDLAR, MD Primary Care Physician:  Shona Norleen PEDLAR, MD Primary GI Physician: Dr. Eartha   Chief Complaint  Patient presents with   Follow-up    Pt presents for follow up. Pt states still having diarrhea but has improved. Is having some regular bowel movements. Last couple of weeks have not been bad. Pt is wondering if diarrhea is coming from colon or stomach.   HPI:   Amanda Carlson is a 78 y.o. female with past medical history of carpal tunnel, DM, GERD, hypothyroidism, asthma, IBS, sleep apnea, tongue cancer, cirrhosis secondary to [facility]   Patient presenting today for follow up of diarrhea/suspected EPI and cirrhosis   Last seen November 2024, at that time patient reported history of IBS is mostly well-controlled into her before her EGD.  Having explosive diarrhea almost daily since then.  Stopped omeprazole  for few weeks to see if diarrhea got better but it persisted.  Stools can be loose to watery.  Noting some lower abdominal discomfort at times.  Patient recommended to have upper quadrant ultrasound in December, MELD labs, CBC, AFP, TSH, GI pathogen panel, C. difficile, celiac, continue carvedilol  3.125 mg twice daily, continue omeprazole  20 mg daily  Pancreatic elastase 178 in November, fecal fat normal, GI pathogen panel and C. difficile testing negative, GI pathogen panel and C. difficile testing negative, celiac disease panel with IgA 488, otherwise normal  Patient advised to start on Creon  thereafter  Present: Patient reports she started with watery stools last may. For the past 2-3 months has had more soft but solid stools. Having approximately 1-2 BMs per day, sometimes more. She wonders if some of the sugar free foods she is eating are worsening her diarrhea. She is also doing some diabetic glucernas and feels that sometimes these cause some diarrhea. She is eating yogurt with probiotics. Still with some diarrhea but feels this is much better.  She is taking creon  with her meals and with snacks. She notes some ongoing pressure in her lower abdomen that usually improves with defecation. She denies any abdominal pain, rectal bleeding or melena.   She is trying to watch her diet and eat better, has lost about 7 pounds doing that   She reports lower BPs recently, running around 100/60 but no lightheadedness or dizziness. No syncope.  Denies episodes of confusion, forgetfulness, no jaundice, pruritus or swelling to abdomen or legs.    Previous MELD 3.0-7   Cirrhosis related questions: Episodes of confusion/disorientation: no Taking diuretics? none Beta blockers? Coreg  3.125mg  BID  Prior history of variceal banding? No  Prior episodes of SBP? No  Last liver imaging: 11/2023 increased hepatic parenchymal echogenicity suggestive of steatosis, no acute process Alcohol use: none    Negative cologuard 2023 Last Colonoscopy:12/2004  Last Endoscopy: 04/2023  - Grade I and II esophageal varices.                           - 3 cm paraesophageal hernia.                           - Erosive gastropathy with stigmata of recent                            bleeding. Biopsied.                           -  Normal examined duodenum. Mild reactive gastropathy.  - Negative for H. pylori on HE stain  - No intestinal metaplasia, dysplasia, or malignancy.   Past Medical History:  Diagnosis Date   ALLERGIC RHINITIS, SEASONAL 01/22/2011   Qualifier: Diagnosis of  By: Domenica MD, Stacey     Arthritis    Breast cancer screening 01/18/2014   Calcified Right side? Follows at [facility]    Bronchitis    hx of   Cervical cancer screening 04/11/2014   CTS (carpal tunnel syndrome) 06/07/2012   Diabetes mellitus type 2 in obese 06/29/2015   Diabetes mellitus without complication (HCC)    Difficulty swallowing    Liquids and foods at times   Family history of adverse reaction to anesthesia    Pts mother has a difficult time waking up after anesthesia   GERD  01/22/2011   Qualifier: Diagnosis of  By: Domenica MD, Stacey     Heart murmur    HYPOTHYROIDISM 01/22/2011   Qualifier: Diagnosis of  By: Domenica MD, Harlene Abide with Dr Tommas    Insomnia 06/07/2012   Intrinsic asthma, unspecified 01/22/2011   Qualifier: Diagnosis of  By: Domenica MD, Stacey     Irritable bowel syndrome 01/22/2011   Qualifier: Diagnosis of  By: Domenica MD, Stacey     Knee pain, bilateral 10/16/2011   Knee pain, right 10/16/2011   Lesion of breast 01/18/2014   Calcified Right side? 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  SCC of tongue.     THYROID  CYST 01/22/2011   Qualifier: Diagnosis of  By: Domenica MD, Stacey     Tinea corporis 06/07/2012   Tinea corporis 02/22/2016   Tongue cancer (HCC)    Tongue lesion 01/18/2014    Past Surgical History:  Procedure Laterality Date   BIOPSY  05/10/2023   Procedure: BIOPSY;  Surgeon: Eartha Flavors, Toribio, MD;  Location: [facility];  Service: Gastroenterology;;   cervical fusion, discectomy, metal plate and 2 screws in place     CHOLECYSTECTOMY     ESOPHAGOGASTRODUODENOSCOPY (EGD) WITH PROPOFOL  N/A 05/10/2023   Procedure: ESOPHAGOGASTRODUODENOSCOPY (EGD) WITH PROPOFOL ;  Surgeon: Eartha Flavors Toribio, MD;  Location: [facility];  Service: Gastroenterology;  Laterality: N/A;  9:00am;asa 3, moved from 5/3 to 5/21 per Tanya   EXCISION OF TONGUE LESION Left 07/11/2015   Procedure: EXCISION OF TONGUE LESION;  Surgeon: Norleen Notice, MD;  Location: [facility] OR;  Service: ENT;  Laterality: Left;   KNEE ARTHROSCOPY      right   RADICAL NECK DISSECTION Left 07/11/2015   Procedure: RADICAL NECK DISSECTION;  Surgeon: Norleen Notice, MD;  Location: [facility] OR;  Service: ENT;  Laterality: Left;  Left neck dissection and left tongue resection   THYROIDECTOMY, PARTIAL     TUBAL LIGATION      Current Outpatient Medications  Medication Sig Dispense Refill   acetaminophen  (TYLENOL ) 325 MG tablet Take 650 mg by mouth every 6 (six) hours as needed.     ALPRAZolam  (XANAX )  0.25 MG tablet TAKE 1 TABLET BY MOUTH EVERY DAY AS NEEDED FOR ANXIETY OR SLEEP 30 tablet 3   carvedilol  (COREG ) 3.125 MG tablet TAKE 1 TABLET BY MOUTH TWICE A DAY WITH A MEAL 180 tablet 1   empagliflozin (JARDIANCE) 25 MG TABS tablet Take 25 mg by mouth daily.     glucose blood (IGLUCOSE TEST STRIPS) test strip Check blood sugar once daily 100 each 12   levothyroxine  (SYNTHROID ) 112 MCG tablet Take 112 mcg by mouth daily.     lipase/protease/amylase (CREON ) 36000 UNITS CPEP capsule Take 2 capsules (72,000 Units total) by mouth 3 (three) times daily with meals. May also take 1 capsule (36,000 Units total) as needed (with snacks - up to 4 snacks daily). 300 capsule 11   metFORMIN  (GLUCOPHAGE -XR) 500 MG 24 hr tablet Take 500 mg by mouth. One at lunch, one a supper and 2 at bedtime     omeprazole  (PRILOSEC) 20 MG capsule Take 1 capsule (20 mg total) by mouth daily. 90 capsule 3   ONETOUCH DELICA LANCETS 33G MISC Use as directed once daily.  E11.9 100 each 1   OVER THE COUNTER MEDICATION Vit C 1,000 mg daily  Vit B12 1,000 mcg occasionaly  Biotin 10,000 mcg     OVER THE COUNTER MEDICATION Allergy relief one a day     Polyethyl Glycol-Propyl Glycol (SYSTANE OP) Apply to eye. As needed     simvastatin  (ZOCOR ) 40 MG tablet Take 40 mg by mouth at bedtime.     sodium chloride  (OCEAN) 0.65 % SOLN nasal spray Place 1 spray into both nostrils as needed for congestion.     Vibegron (GEMTESA) 75 MG TABS Take by mouth daily.     Vitamin D , Ergocalciferol , (DRISDOL ) 50000  units CAPS capsule Take 1 capsule (50,000 Units total) by mouth every 7 (seven) days. Mondays 4 capsule 4   No current facility-administered medications for this visit.    Allergies as of 01/24/2024 - Review Complete 01/24/2024  Allergen Reaction Noted   Codeine Itching 01/22/2011   Tetanus toxoid Swelling    Adhesive [tape] Rash 09/18/2013    Family History  Problem Relation Age of Onset   Atrial fibrillation Mother    Other Mother        Back pain, CHF   Arthritis Mother    Osteoporosis Mother    Heart disease Father    Hypertension Father    Diabetes Father    Sleep apnea Father    Atrial fibrillation Father        paroxysmal   Cancer Father        skin cancers on face   Diabetes Brother    Diabetes Maternal Aunt        X 2 aunts   Cancer Maternal Grandmother    Diabetes Maternal Grandfather    Stroke Paternal Grandmother    Heart disease Paternal Grandfather    Stroke Paternal Grandfather     Social History   Socioeconomic History   Marital status: Divorced    Spouse name: Not on file   Number of children: Not on file   Years of education: Not on file   Highest education level: Not on file  Occupational History   Not on file  Tobacco Use   Smoking status: Never   Smokeless tobacco: Never  Substance and Sexual Activity   Alcohol use: No    Alcohol/week: 0.0 standard drinks of alcohol    Comment: No intake of alcohol in greater  than 10 yrs   Drug use: No   Sexual activity: Not on file  Other Topics Concern   Not on file  Social History Narrative   Not on file   Social Drivers of Health   Financial Resource Strain: Not on file  Food Insecurity: Not on file  Transportation Needs: Not on file  Physical Activity: Not on file  Stress: Not on file  Social Connections: Not on file    Review of systems General: negative for malaise, night sweats, fever, chills, weight loss Neck: Negative for lumps, goiter, pain and significant neck swelling Resp:  Negative for cough, wheezing, dyspnea at rest CV: Negative for chest pain, leg swelling, palpitations, orthopnea GI: denies melena, hematochezia, nausea, vomiting, constipation, dysphagia, odyonophagia, early satiety or unintentional weight loss. +diarrhea  MSK: Negative for joint pain or swelling, back pain, and muscle pain. Derm: Negative for itching or rash Psych: Denies depression, anxiety, memory loss, confusion. No homicidal or suicidal ideation.  Heme: Negative for prolonged bleeding, bruising easily, and swollen nodes. Endocrine: Negative for cold or heat intolerance, polyuria, polydipsia and goiter. Neuro: negative for tremor, gait imbalance, syncope and seizures. The remainder of the review of systems is noncontributory.  Physical Exam: BP 105/70   Pulse 65   Temp (!) 97.2 F (36.2 C)   Ht 5' 7 (1.702 m)   Wt 162 lb 3.2 oz (73.6 kg)   BMI 25.40 kg/m  General:   Alert and oriented. No distress noted. Pleasant and cooperative.  Head:  Normocephalic and atraumatic. Eyes:  Conjuctiva clear without scleral icterus. Mouth:  Oral mucosa pink and moist. Good dentition. No lesions. Heart: Normal rate and rhythm, s1 and s2 heart sounds present.  Lungs: Clear lung sounds in all lobes. Respirations equal and unlabored. Abdomen:  +BS, soft, non-tender and non-distended. No rebound or guarding. No HSM or masses noted. Derm: No palmar erythema or jaundice Msk:  Symmetrical without gross deformities. Normal posture. Extremities:  Without edema. Neurologic:  Alert and  oriented x4 Psych:  Alert and cooperative. Normal mood and affect.  Invalid input(s): 6 MONTHS   ASSESSMENT: Amanda Carlson is a 78 y.o. female presenting today for follow up of diarrhea suspected secondary to EPI and cirrhosis   Patient with history of IBS though with more diarrhea since May 2024.  She denies any rectal bleeding, melena, abdominal pain or unintentional weight loss.  She has had improvement with  Creon  which was initiated after her Pancreatic elastase resulted at 178.  She is now having 1-2 stools per day, usually with softer more solid stools, occasionally with diarrhea though she does note she is doing a lot of low sugar free foods with artificial sweeteners which we discussed can cause GI upset/diarrhea.  At this time we will continue with current Creon  dose of 2 capsules with meal and 1 with snack and she should be mindful of dietary intake of artificial sweeteners.  May consider increasing Creon  at next visit if she continues to have looser stools.  In regards to her cirrhosis she is up-to-date on HCC screening and MELD labs with last MELD 3.0 in November of 7.  She has remained well compensated, grade 1 EV's on last EGD in May 2024, she is maintained on Coreg  3.125 mg twice daily.  Will continue with current regimen, she is due for MELD labs again in May and right upper quadrant ultrasound in June.  PLAN:  -Right upper quadrant ultrasound due in June -MELD labs, CBC, AFP  due in May -Continue Creon  72k, consider increasing dose at f/u -Continue Coreg  3.125 mg twice daily -be mindful of artificial sweeteners - Reduce salt intake to <2 g per day - Can take Tylenol  max of 2 g per day (650 mg q8h) for pain - Avoid NSAIDs for pain - Avoid eating raw oysters/shellfish - Ensure every night before going to sleep   All questions were answered, patient verbalized understanding and is in agreement with plan as outlined above.   Follow Up: 3 months   Jesiel Garate L. Mariette, MSN, APRN, AGNP-C Adult-Gerontology Nurse Practitioner [facility]  I have reviewed the note and agree with the APP's assessment as described in this progress note  Toribio Fortune, MD Gastroenterology and Hepatology [facility] Gastroenterology

## 2024-01-24 NOTE — Patient Instructions (Addendum)
-  Continue Creon  2 capsules with meals and 1 capsule with snacks, if diarrhea persists, we will consider increasing dose at next visit  -Continue Coreg  3.125 mg twice daily -be mindful of artificial sweeteners - Reduce salt intake to <2 g per day - Can take Tylenol  max of 2 g per day (650 mg q8h) for pain - Avoid NSAIDs for pain - Avoid eating raw oysters/shellfish - Ensure protein shake every night before going to sleep  Follow up 3 months

## 2024-01-25 ENCOUNTER — Telehealth (INDEPENDENT_AMBULATORY_CARE_PROVIDER_SITE_OTHER): Payer: Self-pay | Admitting: *Deleted

## 2024-01-25 NOTE — Telephone Encounter (Signed)
 Patient called and requested us  to call Dr. Del Favia to get copy of cologuard that he ordered. I called and got their fax number [phone] and faxed over request.

## 2024-02-08 ENCOUNTER — Other Ambulatory Visit (INDEPENDENT_AMBULATORY_CARE_PROVIDER_SITE_OTHER): Payer: Self-pay | Admitting: Gastroenterology

## 2024-04-27 ENCOUNTER — Encounter (INDEPENDENT_AMBULATORY_CARE_PROVIDER_SITE_OTHER): Payer: Self-pay | Admitting: *Deleted

## 2024-05-01 ENCOUNTER — Ambulatory Visit (INDEPENDENT_AMBULATORY_CARE_PROVIDER_SITE_OTHER): Payer: Medicare PPO | Admitting: Gastroenterology

## 2024-06-05 ENCOUNTER — Telehealth: Payer: Self-pay | Admitting: *Deleted

## 2024-06-05 ENCOUNTER — Encounter (INDEPENDENT_AMBULATORY_CARE_PROVIDER_SITE_OTHER): Payer: Self-pay | Admitting: Gastroenterology

## 2024-06-05 ENCOUNTER — Ambulatory Visit (INDEPENDENT_AMBULATORY_CARE_PROVIDER_SITE_OTHER): Admitting: Gastroenterology

## 2024-06-05 VITALS — BP 124/72 | HR 79 | Temp 97.5°F | Ht 67.0 in | Wt 165.5 lb

## 2024-06-05 DIAGNOSIS — K7460 Unspecified cirrhosis of liver: Secondary | ICD-10-CM

## 2024-06-05 DIAGNOSIS — K8681 Exocrine pancreatic insufficiency: Secondary | ICD-10-CM | POA: Diagnosis not present

## 2024-06-05 DIAGNOSIS — K7469 Other cirrhosis of liver: Secondary | ICD-10-CM | POA: Diagnosis not present

## 2024-06-05 DIAGNOSIS — K7581 Nonalcoholic steatohepatitis (NASH): Secondary | ICD-10-CM | POA: Diagnosis not present

## 2024-06-05 MED ORDER — PANCRELIPASE (LIP-PROT-AMYL) 36000-114000 UNITS PO CPEP: ORAL_CAPSULE | ORAL | 3 refills | Status: AC

## 2024-06-05 MED ORDER — CARVEDILOL 6.25 MG PO TABS: 6.2500 mg | ORAL_TABLET | Freq: Two times a day (BID) | ORAL | 3 refills | Status: AC

## 2024-06-05 NOTE — Progress Notes (Addendum)
 Referring Provider: Omie Bickers, MD Primary Care Physician:  Omie Bickers, MD Primary GI Physician: Dr. Sammi Crick   Chief Complaint  Patient presents with   Follow-up    Patient here today for a follow up on diarrhea. She says this is somewhat better,but still having it. She is taking Metamucil with fiber, and Imodium prn. She does have issues with fecal incontinence.   HPI:   Amanda Carlson is a 78 y.o. female with past medical history of carpal tunnel, DM, GERD, hypothyroidism, asthma, IBS, sleep apnea, tongue cancer, cirrhosis secondary to [facility]   Patient presenting today for:  Follow up of diarrhea/suspected secondary to EPI and cirrhosis  Last seen February 2025, at that time still with watery stools, taking creon  with meals and snacks, has some ongoing pressure in lower abdomen.   Recommended RUQ US  in June, MELD labs, CBC, AFP, INR in may, continue creon  72k, consider increase in dose at FU, continue coreg  3.125mg  BID  Present:  Diarrhea some better on creon  though continues to have some. She takes creon  sometimes a few minutes before eating, sometimes during meals and sometimes right after. Has usually a few episodes of diarrhea per day with urgency and some incontinence. At times she has normal stools. She is using metamucil fiber and imodium PRN especially when she goes out. She has tried stopping the metamucil but didn't notice much difference. She endorses some gas. She denies rectal bleeding, melena, weight loss, changes in appetite  No swelling to legs or abdomen, denies any episodes of confusion. Denies jaundice or pruritus.   She does check BP at home, generally BP runs around low 100s to 110/ 60-80, HR generally runs 60s-70s   Previous MELD 3.0 was 7 in nov 2024   Cirrhosis related questions: Episodes of confusion/disorientation: no Taking diuretics? none Beta blockers? Coreg  3.125mg  BID  Prior history of variceal banding? No  Prior episodes of SBP? No  Last  liver imaging: 11/2023 increased hepatic parenchymal echogenicity suggestive of steatosis, no acute process Alcohol use: none    Previous diarrhea workup: Pancreatic elastase 178 in November 2024  fecal fat normal, celiac disease panel with IgA 488,  Negative cologuard 2023 Last Colonoscopy:12/2004  Last Endoscopy: 04/2023  - Grade I and II esophageal varices.                           - 3 cm paraesophageal hernia.                           - Erosive gastropathy with stigmata of recent                            bleeding. Biopsied.                           - Normal examined duodenum. Mild reactive gastropathy.  - Negative for H. pylori on HE stain  - No intestinal metaplasia, dysplasia, or malignancy.    Past Medical History:  Diagnosis Date   ALLERGIC RHINITIS, SEASONAL 01/22/2011   Qualifier: Diagnosis of  By: Rodrick Clapper MD, Stacey     Arthritis    Breast cancer screening 01/18/2014   Calcified Right side? Follows at [facility]    Bronchitis    hx of   Cervical cancer screening 04/11/2014   CTS (carpal tunnel  syndrome) 06/07/2012   Diabetes mellitus type 2 in obese 06/29/2015   Diabetes mellitus without complication (HCC)    Difficulty swallowing    Liquids and foods at times   Family history of adverse reaction to anesthesia    Pts mother has a difficult time waking up after anesthesia   GERD 01/22/2011   Qualifier: Diagnosis of  By: Rodrick Clapper MD, Stacey     Heart murmur    HYPOTHYROIDISM 01/22/2011   Qualifier: Diagnosis of  By: Rodrick Clapper MD, Maudine Sos with Dr Ronelle Coffee    Insomnia 06/07/2012   Intrinsic asthma, unspecified 01/22/2011   Qualifier: Diagnosis of  By: Rodrick Clapper MD, Stacey     Irritable bowel syndrome 01/22/2011   Qualifier: Diagnosis of  By: Rodrick Clapper MD, Stacey     Knee pain, bilateral 10/16/2011   Knee pain, right 10/16/2011   Lesion of breast 01/18/2014   Calcified Right side? 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  SCC of tongue.     THYROID  CYST 01/22/2011   Qualifier: Diagnosis of  By: Rodrick Clapper MD, Stacey     Tinea corporis 06/07/2012   Tinea corporis 02/22/2016   Tongue cancer (HCC)    Tongue lesion 01/18/2014    Past Surgical History:  Procedure Laterality Date   BIOPSY  05/10/2023   Procedure: BIOPSY;  Surgeon: Umberto Ganong, Bearl Limes, MD;  Location: [facility];  Service: Gastroenterology;;   cervical fusion, discectomy, metal plate and 2 screws in place     CHOLECYSTECTOMY     ESOPHAGOGASTRODUODENOSCOPY (EGD) WITH PROPOFOL  N/A 05/10/2023   Procedure: ESOPHAGOGASTRODUODENOSCOPY (EGD) WITH PROPOFOL ;  Surgeon: Urban Garden, MD;  Location: [facility];  Service: Gastroenterology;  Laterality: N/A;  9:00am;asa 3, moved from 5/3 to 5/21 per Tanya   EXCISION OF TONGUE LESION Left 07/11/2015   Procedure: EXCISION OF TONGUE LESION;  Surgeon: Vernadine Golas, MD;  Location: [facility] OR;  Service: ENT;  Laterality: Left;   KNEE ARTHROSCOPY     right   RADICAL NECK DISSECTION Left 07/11/2015   Procedure: RADICAL NECK DISSECTION;  Surgeon: Vernadine Golas, MD;  [facility];  Service: ENT;  Laterality: Left;  Left neck dissection and left tongue resection   THYROIDECTOMY, PARTIAL     TUBAL LIGATION      Current Outpatient Medications  Medication Sig Dispense Refill   acetaminophen  (TYLENOL ) 325 MG tablet Take 650 mg by mouth every 6 (six) hours as needed.     ALPRAZolam  (XANAX ) 0.25 MG tablet TAKE 1 TABLET BY MOUTH EVERY DAY AS NEEDED FOR ANXIETY OR SLEEP (Patient taking differently: TAKE 1 TABLET BY MOUTH EVERY DAY AS NEEDED FOR  ANXIETY OR SLEEP as needed.) 30 tablet 3   carvedilol  (COREG ) 3.125 MG tablet TAKE 1 TABLET BY MOUTH TWICE A DAY WITH FOOD 180 tablet 1   empagliflozin (JARDIANCE) 25 MG TABS tablet Take 25 mg by mouth daily.     glucose blood (IGLUCOSE TEST STRIPS) test strip Check blood sugar once daily  100 each 12   levothyroxine  (SYNTHROID ) 112 MCG tablet Take 112 mcg by mouth daily.     lipase/protease/amylase (CREON ) 36000 UNITS CPEP capsule Take 2 capsules (72,000 Units total) by mouth 3 (three) times daily with meals. May also take 1 capsule (36,000 Units total) as needed (with snacks - up to 4 snacks daily). 300 capsule 11   metFORMIN  (GLUCOPHAGE -XR) 500 MG 24 hr tablet Take 500 mg by mouth. One at lunch, one a supper and 2 at bedtime     omeprazole  (PRILOSEC) 20 MG capsule Take 1 capsule (20 mg total) by mouth daily. 90 capsule 3   ONETOUCH DELICA LANCETS 33G MISC Use as directed once daily.  E11.9 100 each 1   OVER THE COUNTER MEDICATION Vit C 1,000 mg daily  Vit B12 1,000 mcg occasionaly  Biotin 10,000 mcg     OVER THE COUNTER MEDICATION Allergy relief one a day     Polyethyl Glycol-Propyl Glycol (SYSTANE OP) Apply to eye. As needed     simvastatin  (ZOCOR ) 40 MG tablet Take 40 mg by mouth at bedtime.     sodium chloride  (OCEAN) 0.65 % SOLN nasal spray Place 1 spray into both nostrils as needed for congestion.     Vibegron (GEMTESA) 75 MG TABS Take by mouth daily.     Vitamin D , Ergocalciferol , (DRISDOL ) 50000 units CAPS capsule Take 1 capsule (50,000 Units total) by mouth every 7 (seven) days. Mondays 4 capsule 4   No current facility-administered medications for this visit.    Allergies as of 06/05/2024 - Review Complete 06/05/2024  Allergen Reaction Noted   Codeine Itching 01/22/2011   Tetanus toxoid Swelling    Adhesive [tape] Rash 09/18/2013    Social History   Socioeconomic History   Marital status: Divorced    Spouse name: Not on file   Number of children: Not on file   Years of  education: Not on file   Highest education level: Not on file  Occupational History   Not on file  Tobacco Use   Smoking status: Never   Smokeless tobacco: Never  Vaping Use   Vaping status: Never Used  Substance and Sexual Activity   Alcohol use: No    Alcohol/week: 0.0 standard drinks of alcohol    Comment: No intake of alcohol in greater than 10 yrs   Drug use: No   Sexual activity: Not on file  Other Topics Concern   Not on file  Social History Narrative   Not on file   Social Drivers of Health   Financial Resource Strain: Not on file  Food Insecurity: Not on file  Transportation Needs: Not on file  Physical Activity: Not on file  Stress: Not on file  Social Connections: Not on file    Review of systems General: negative for malaise, night sweats, fever, chills, weight loss Neck: Negative for lumps, goiter, pain and significant neck swelling Resp: Negative for cough, wheezing, dyspnea at rest CV: Negative for chest pain, leg swelling, palpitations, orthopnea GI: denies melena, hematochezia, nausea, vomiting, constipation, dysphagia, odyonophagia, early satiety or unintentional weight loss. +diarrhea  MSK: Negative for joint pain or swelling, back pain, and muscle pain. Derm: Negative for itching or rash Psych: Denies depression, anxiety, memory loss, confusion. No homicidal or suicidal ideation.  Heme: Negative for prolonged bleeding, bruising easily, and swollen nodes. Endocrine: Negative for cold or heat intolerance, polyuria, polydipsia and goiter. Neuro: negative for tremor, gait imbalance, syncope and seizures. The remainder of the review of  systems is noncontributory.  Physical Exam: BP 124/72 (BP Location: Left Arm, Patient Position: Sitting, Cuff Size: Normal)   Pulse 79   Temp (!) 97.5 F (36.4 C) (Temporal)   Ht 5' 7 (1.702 m)   Wt 165 lb 8 oz (75.1 kg)   BMI 25.92 kg/m  General:   Alert and oriented. No distress noted. Pleasant and cooperative.   Head:  Normocephalic and atraumatic. Eyes:  Conjuctiva clear without scleral icterus. Mouth:  Oral mucosa pink and moist. Good dentition. No lesions. Heart: Normal rate and rhythm, s1 and s2 heart sounds present.  Lungs: Clear lung sounds in all lobes. Respirations equal and unlabored. Abdomen:  +BS, soft, non-tender and non-distended. No rebound or guarding. No HSM or masses noted. Derm: No palmar erythema or jaundice Msk:  Symmetrical without gross deformities. Normal posture. Extremities:  Without edema. Neurologic:  Alert and  oriented x4 Psych:  Alert and cooperative. Normal mood and affect.  Invalid input(s): 6 MONTHS   ASSESSMENT: JAYDYN MENON is a 78 y.o. 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 She is due for [facility] screening and MELD labs/AFP, last MELD 3.0 in November was low at 7. No signs of HE.   EPI: pancreatic elastase low at 178 in November 2024, she has had some improvement with creon  72k units with meals and 36k units with snacks, her dosage time varies. We discussed importance of taking creon  with her meals. Will increase dose to 108k units with meals and 72k units with snacks as we have room to go up.    PLAN:  -increase creon  to 3 with meals and 2 with snacks (108k/72k) -MELD labs, AFP, CBC -continue coreg , increase to 6.25mg  BID  -RUQ US  for HCC screening  - Reduce salt intake to <2 g per day - Can take Tylenol  max of 2 g per day (650 mg q8h) for pain - Avoid NSAIDs for pain - Avoid eating raw oysters/shellfish - Ensure every night before going to sleep  All questions were answered, patient verbalized understanding and is in agreement with plan as outlined above.   Follow Up: 6  months   Tarren Sabree L. Adrien Alberta, MSN, APRN, AGNP-C Adult-Gerontology Nurse Practitioner [facility]  I have reviewed the note and agree with the APP's assessment as described in this progress note  Samantha Cress, MD Gastroenterology and Hepatology [facility] Gastroenterology

## 2024-06-05 NOTE — Patient Instructions (Signed)
-  We will update labs and US  in regards to your liver -please increase creon  to 3 capsules with a meal and 2 with a snack, make sure to take this as you are eating your meal -Please increase coreg  to 6.25mg  twice daily, you can double up on your 3.125mg  pills for now, taking 2 in the morning and 2 at night - Reduce salt intake to <2 g per day - Can take Tylenol  max of 2 g per day (650 mg q8h) for pain - Avoid NSAIDs for pain - Avoid eating raw oysters/shellfish - Ensure every night before going to sleep  Follow up 6 months

## 2024-06-05 NOTE — Telephone Encounter (Signed)
 Called pt and gave US  appt details also provided # for central scheduling if she needs to reschedule

## 2024-06-06 LAB — CBC
HCT: 41.8 % (ref 35.0–45.0)
Hemoglobin: 13.2 g/dL (ref 11.7–15.5)
MCH: 29.1 pg (ref 27.0–33.0)
MCHC: 31.6 g/dL — ABNORMAL LOW (ref 32.0–36.0)
MCV: 92.1 fL (ref 80.0–100.0)
MPV: 11.4 fL (ref 7.5–12.5)
Platelets: 157 10*3/uL (ref 140–400)
RBC: 4.54 10*6/uL (ref 3.80–5.10)
RDW: 14.4 % (ref 11.0–15.0)
WBC: 7.1 10*3/uL (ref 3.8–10.8)

## 2024-06-06 LAB — COMPREHENSIVE METABOLIC PANEL WITH GFR
AG Ratio: 1.7 (calc) (ref 1.0–2.5)
ALT: 47 U/L — ABNORMAL HIGH (ref 6–29)
AST: 33 U/L (ref 10–35)
Albumin: 4.6 g/dL (ref 3.6–5.1)
Alkaline phosphatase (APISO): 97 U/L (ref 37–153)
BUN/Creatinine Ratio: 34 (calc) — ABNORMAL HIGH (ref 6–22)
BUN: 20 mg/dL (ref 7–25)
CO2: 25 mmol/L (ref 20–32)
Calcium: 9.9 mg/dL (ref 8.6–10.4)
Chloride: 103 mmol/L (ref 98–110)
Creat: 0.58 mg/dL — ABNORMAL LOW (ref 0.60–1.00)
Globulin: 2.7 g/dL (ref 1.9–3.7)
Glucose, Bld: 162 mg/dL — ABNORMAL HIGH (ref 65–99)
Potassium: 4.3 mmol/L (ref 3.5–5.3)
Sodium: 141 mmol/L (ref 135–146)
Total Bilirubin: 0.4 mg/dL (ref 0.2–1.2)
Total Protein: 7.3 g/dL (ref 6.1–8.1)
eGFR: 92 mL/min/{1.73_m2} (ref >=60)

## 2024-06-06 LAB — AFP TUMOR MARKER: AFP-Tumor Marker: 2 ng/mL

## 2024-06-06 LAB — PROTIME-INR
INR: 1
Prothrombin Time: 10.5 s (ref 9.0–11.5)

## 2024-06-07 ENCOUNTER — Ambulatory Visit (INDEPENDENT_AMBULATORY_CARE_PROVIDER_SITE_OTHER): Payer: Self-pay | Admitting: Gastroenterology

## 2024-06-07 ENCOUNTER — Other Ambulatory Visit (INDEPENDENT_AMBULATORY_CARE_PROVIDER_SITE_OTHER): Payer: Self-pay | Admitting: Gastroenterology

## 2024-06-07 DIAGNOSIS — K2970 Gastritis, unspecified, without bleeding: Secondary | ICD-10-CM

## 2024-06-13 ENCOUNTER — Ambulatory Visit (HOSPITAL_COMMUNITY)

## 2024-06-14 DIAGNOSIS — E559 Vitamin D deficiency, unspecified: Secondary | ICD-10-CM | POA: Diagnosis not present

## 2024-06-14 DIAGNOSIS — E1169 Type 2 diabetes mellitus with other specified complication: Secondary | ICD-10-CM | POA: Diagnosis not present

## 2024-06-15 DIAGNOSIS — E1169 Type 2 diabetes mellitus with other specified complication: Secondary | ICD-10-CM | POA: Diagnosis not present

## 2024-06-20 DIAGNOSIS — F411 Generalized anxiety disorder: Secondary | ICD-10-CM | POA: Diagnosis not present

## 2024-06-20 DIAGNOSIS — N3281 Overactive bladder: Secondary | ICD-10-CM | POA: Diagnosis not present

## 2024-06-20 DIAGNOSIS — K7460 Unspecified cirrhosis of liver: Secondary | ICD-10-CM | POA: Diagnosis not present

## 2024-06-20 DIAGNOSIS — E1169 Type 2 diabetes mellitus with other specified complication: Secondary | ICD-10-CM | POA: Diagnosis not present

## 2024-06-20 DIAGNOSIS — I10 Essential (primary) hypertension: Secondary | ICD-10-CM | POA: Diagnosis not present

## 2024-06-20 DIAGNOSIS — E039 Hypothyroidism, unspecified: Secondary | ICD-10-CM | POA: Diagnosis not present

## 2024-06-20 DIAGNOSIS — K219 Gastro-esophageal reflux disease without esophagitis: Secondary | ICD-10-CM | POA: Diagnosis not present

## 2024-06-20 DIAGNOSIS — E782 Mixed hyperlipidemia: Secondary | ICD-10-CM | POA: Diagnosis not present

## 2024-06-20 DIAGNOSIS — Z85810 Personal history of malignant neoplasm of tongue: Secondary | ICD-10-CM | POA: Diagnosis not present

## 2024-07-09 ENCOUNTER — Ambulatory Visit (HOSPITAL_COMMUNITY)
Admission: RE | Admit: 2024-07-09 | Discharge: 2024-07-09 | Disposition: A | Discharge status: Home / Self Care | Source: Ambulatory Visit | Attending: Gastroenterology | Admitting: Gastroenterology

## 2024-07-09 DIAGNOSIS — K7469 Other cirrhosis of liver: Secondary | ICD-10-CM | POA: Insufficient documentation

## 2024-07-09 DIAGNOSIS — Z9049 Acquired absence of other specified parts of digestive tract: Secondary | ICD-10-CM | POA: Diagnosis not present

## 2024-07-09 DIAGNOSIS — K7689 Other specified diseases of liver: Secondary | ICD-10-CM | POA: Diagnosis not present

## 2024-07-09 DIAGNOSIS — K760 Fatty (change of) liver, not elsewhere classified: Secondary | ICD-10-CM | POA: Diagnosis not present

## 2024-07-09 DIAGNOSIS — K7460 Unspecified cirrhosis of liver: Secondary | ICD-10-CM | POA: Diagnosis not present

## 2024-07-23 NOTE — Progress Notes (Signed)
6 mth US noted in recall 

## 2024-10-02 DIAGNOSIS — G4733 Obstructive sleep apnea (adult) (pediatric): Secondary | ICD-10-CM | POA: Diagnosis not present

## 2024-10-08 ENCOUNTER — Telehealth (INDEPENDENT_AMBULATORY_CARE_PROVIDER_SITE_OTHER): Payer: Self-pay | Admitting: Gastroenterology

## 2024-10-08 DIAGNOSIS — K7469 Other cirrhosis of liver: Secondary | ICD-10-CM

## 2024-10-08 NOTE — Telephone Encounter (Signed)
 Pt is aware of her 11/22/2024 OV and is asking about scheduling her U/S that will be due in December. Can we go ahead and get that scheduled for her?  [phone]

## 2024-10-10 NOTE — Telephone Encounter (Signed)
[place] scheduled and letter mailed.

## 2024-10-16 DIAGNOSIS — E559 Vitamin D deficiency, unspecified: Secondary | ICD-10-CM | POA: Diagnosis not present

## 2024-10-16 DIAGNOSIS — E039 Hypothyroidism, unspecified: Secondary | ICD-10-CM | POA: Diagnosis not present

## 2024-10-16 DIAGNOSIS — E1169 Type 2 diabetes mellitus with other specified complication: Secondary | ICD-10-CM | POA: Diagnosis not present

## 2024-10-17 ENCOUNTER — Other Ambulatory Visit (HOSPITAL_COMMUNITY): Payer: Self-pay | Admitting: Internal Medicine

## 2024-10-17 DIAGNOSIS — E1169 Type 2 diabetes mellitus with other specified complication: Secondary | ICD-10-CM | POA: Diagnosis not present

## 2024-10-17 DIAGNOSIS — E039 Hypothyroidism, unspecified: Secondary | ICD-10-CM | POA: Diagnosis not present

## 2024-10-17 DIAGNOSIS — E559 Vitamin D deficiency, unspecified: Secondary | ICD-10-CM | POA: Diagnosis not present

## 2024-10-17 DIAGNOSIS — Z1231 Encounter for screening mammogram for malignant neoplasm of breast: Secondary | ICD-10-CM

## 2024-10-24 ENCOUNTER — Encounter (INDEPENDENT_AMBULATORY_CARE_PROVIDER_SITE_OTHER): Payer: Self-pay | Admitting: Gastroenterology

## 2024-11-05 DIAGNOSIS — E1165 Type 2 diabetes mellitus with hyperglycemia: Secondary | ICD-10-CM | POA: Diagnosis not present

## 2024-11-05 DIAGNOSIS — E039 Hypothyroidism, unspecified: Secondary | ICD-10-CM | POA: Diagnosis not present

## 2024-11-05 DIAGNOSIS — Z0001 Encounter for general adult medical examination with abnormal findings: Secondary | ICD-10-CM | POA: Diagnosis not present

## 2024-11-05 DIAGNOSIS — E782 Mixed hyperlipidemia: Secondary | ICD-10-CM | POA: Diagnosis not present

## 2024-11-05 DIAGNOSIS — E119 Type 2 diabetes mellitus without complications: Secondary | ICD-10-CM | POA: Diagnosis not present

## 2024-11-05 DIAGNOSIS — I10 Essential (primary) hypertension: Secondary | ICD-10-CM | POA: Diagnosis not present

## 2024-11-05 DIAGNOSIS — Z23 Encounter for immunization: Secondary | ICD-10-CM | POA: Diagnosis not present

## 2024-11-05 DIAGNOSIS — E663 Overweight: Secondary | ICD-10-CM | POA: Diagnosis not present

## 2024-11-05 DIAGNOSIS — Z6828 Body mass index (BMI) 28.0-28.9, adult: Secondary | ICD-10-CM | POA: Diagnosis not present

## 2024-11-22 ENCOUNTER — Encounter (INDEPENDENT_AMBULATORY_CARE_PROVIDER_SITE_OTHER): Payer: Self-pay | Admitting: Gastroenterology

## 2024-11-22 ENCOUNTER — Ambulatory Visit (INDEPENDENT_AMBULATORY_CARE_PROVIDER_SITE_OTHER): Admitting: Gastroenterology

## 2024-11-22 VITALS — BP 104/69 | HR 80 | Temp 97.1°F | Ht 63.5 in | Wt 162.6 lb

## 2024-11-22 DIAGNOSIS — I8500 Esophageal varices without bleeding: Secondary | ICD-10-CM | POA: Diagnosis not present

## 2024-11-22 DIAGNOSIS — K7469 Other cirrhosis of liver: Secondary | ICD-10-CM

## 2024-11-22 DIAGNOSIS — K7581 Nonalcoholic steatohepatitis (NASH): Secondary | ICD-10-CM

## 2024-11-22 DIAGNOSIS — K7460 Unspecified cirrhosis of liver: Secondary | ICD-10-CM | POA: Diagnosis not present

## 2024-11-22 DIAGNOSIS — K8681 Exocrine pancreatic insufficiency: Secondary | ICD-10-CM

## 2024-11-22 NOTE — Progress Notes (Addendum)
 Referring Provider: Shona Norleen PEDLAR, MD Primary Care Physician:  Shona Norleen PEDLAR, MD Primary GI Physician: Dr. Eartha   Chief Complaint  Patient presents with   Follow-up    Pt arrives for follow up. Still having diarrhea. Pt has US  appt on 12/05/24. Goes to bathroom about 1-2 times per day   HPI:   Amanda Carlson is a 78 y.o. female with past medical history of carpal tunnel, DM, GERD, hypothyroidism, asthma, IBS, sleep apnea, tongue cancer, cirrhosis secondary to [facility]   Patient presenting today for:  Follow up of cirrhosis and EPI   Last seen June, at that time doing some better on creon , taking this prior to eating. Using metamucil and imodium PRN. Endorses some gas.   Recommended to increase creon  to 108k/72k units with meals/snacks, MELD labs, AFP, CBC, continue coreg  increase to 6.25mg  BID, RUQ US  for [facility] screening  Last labs in June with plt count 157k sodium 141 albumin 4.6 creat 0.58 AST 33 ALT 47 T bili 0.4 alk phos 97 INR 1  AFP 2   Has upcoming US  for HCC screening on 12/17  Present: She is taking 3 creon  with each meal and 2 for snacks. Notably taking creon  usually prior to eating her meal. Still having some diarrhea with incontinence but other times having more formed but soft stools. 1-2 stools per day generally. She does feel that symptoms are improved with creon .  She is doing metamucil 6 gummies per day. Taking imodium as needed. She knows more greasy foods tend to elicit her diarrhea but other times she may eat something non greasy that may trigger it. She denies any abdominal pain. Weight is stable. Appetite is good. No nausea or vomiting. No rectal bleeding or melena. Patient inquired again about the cause of her diarrhea and if she needs colonoscopy.   She denies any swelling to her abdomen or her legs. No confusion, altered mental status.   Previous MELD 3.0 June 2025 7  Cirrhosis related questions: Episodes of confusion/disorientation: no Taking diuretics?  none Beta blockers? Coreg  6.25mg  BID  Prior history of variceal banding? No  Prior episodes of SBP? No  Last liver imaging: 06/2024 Nodular fatty liver. Previous cholecystectomy.  No ductal dilatation Alcohol use: none    Previous diarrhea workup: Pancreatic elastase 178 in November 2024  fecal fat normal, celiac disease panel with IgA 488,  Negative cologuard 2023 Last Colonoscopy:12/2004  Last Endoscopy: 04/2023  - Grade I and II esophageal varices.                           - 3 cm paraesophageal hernia.                           - Erosive gastropathy with stigmata of recent                            bleeding. Biopsied.                           - Normal examined duodenum. Mild reactive gastropathy.  - Negative for H. pylori on HE stain  - No intestinal metaplasia, dysplasia, or malignancy.     Past Medical History:  Diagnosis Date   ALLERGIC RHINITIS, SEASONAL 01/22/2011   Qualifier: Diagnosis of  By: Domenica MD, Harlene  Arthritis    Breast cancer screening 01/18/2014   Calcified Right side? Follows at [facility]    Bronchitis    hx of   Cervical cancer screening 04/11/2014   CTS (carpal tunnel syndrome) 06/07/2012   Diabetes mellitus type 2 in obese 06/29/2015   Diabetes mellitus without complication (HCC)    Difficulty swallowing    Liquids and foods at times   Family history of adverse reaction to anesthesia    Pts mother has a difficult time waking up after anesthesia   GERD 01/22/2011   Qualifier: Diagnosis of  By: Domenica MD, Stacey     Heart murmur    HYPOTHYROIDISM 01/22/2011   Qualifier: Diagnosis of  By: Domenica MD, Harlene Abide with Dr Tommas    Insomnia 06/07/2012   Intrinsic asthma, unspecified 01/22/2011   Qualifier: Diagnosis of  By: Domenica MD, Stacey     Irritable bowel syndrome 01/22/2011   Qualifier: Diagnosis of  By: Domenica MD, Stacey     Knee pain, bilateral 10/16/2011   Knee pain, right 10/16/2011   Lesion of breast 01/18/2014   Calcified Right side?  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  SCC of tongue.     THYROID  CYST 01/22/2011   Qualifier: Diagnosis of  By: Domenica MD, Stacey     Tinea corporis 06/07/2012   Tinea corporis 02/22/2016   Tongue cancer (HCC)    Tongue lesion 01/18/2014    Past Surgical History:  Procedure Laterality Date   BIOPSY  05/10/2023   Procedure: BIOPSY;  Surgeon: Eartha Flavors, Toribio, MD;  Location: [facility];  Service: Gastroenterology;;   cervical fusion, discectomy, metal plate and 2 screws in place     CHOLECYSTECTOMY     ESOPHAGOGASTRODUODENOSCOPY (EGD) WITH PROPOFOL  N/A 05/10/2023   Procedure: ESOPHAGOGASTRODUODENOSCOPY (EGD) WITH PROPOFOL ;  Surgeon: Eartha Flavors Toribio, MD;  Location: [facility];  Service: Gastroenterology;  Laterality: N/A;  9:00am;asa 3, moved from 5/3 to 5/21 per Tanya   EXCISION OF TONGUE LESION Left 07/11/2015   Procedure: EXCISION OF TONGUE LESION;  Surgeon: Norleen Notice, MD;  Location: [facility] OR;  Service: ENT;  Laterality: Left;   KNEE ARTHROSCOPY     right   RADICAL NECK DISSECTION Left 07/11/2015   Procedure: RADICAL NECK DISSECTION;  Surgeon: Norleen Notice, MD;  Location[facility] OR;  Service: ENT;  Laterality: Left;  Left neck dissection and left tongue resection   THYROIDECTOMY, PARTIAL     TUBAL LIGATION      Current Outpatient Medications  Medication Sig Dispense Refill   acetaminophen  (TYLENOL ) 325 MG tablet Take 650 mg by mouth every 6 (six) hours as needed.     ALPRAZolam  (XANAX ) 0.25 MG tablet TAKE 1 TABLET BY MOUTH EVERY DAY AS NEEDED FOR  ANXIETY OR SLEEP (Patient taking differently: TAKE 1 TABLET BY MOUTH EVERY DAY AS NEEDED FOR ANXIETY OR SLEEP as needed.) 30 tablet 3   carvedilol  (COREG ) 6.25 MG tablet Take 1 tablet (6.25 mg total) by  mouth 2 (two) times daily with a meal. 180 tablet 3   empagliflozin (JARDIANCE) 25 MG TABS tablet Take 25 mg by mouth daily.     glucose blood (IGLUCOSE TEST STRIPS) test strip Check blood sugar once daily 100 each 12   levothyroxine  (SYNTHROID ) 112 MCG tablet Take 112 mcg by mouth daily.     lipase/protease/amylase (CREON ) 36000 UNITS CPEP capsule Take 3 capsules (108,000 Units total) by mouth 3 (three) times daily with meals. May also take 2 capsules (72,000 Units total) as needed (with snacks - up to 4 snacks daily). 1170 capsule 3   metFORMIN  (GLUCOPHAGE -XR) 500 MG 24 hr tablet Take 500 mg by mouth. One at lunch, one a supper and 2 at bedtime     omeprazole  (PRILOSEC) 20 MG capsule TAKE 1 CAPSULE BY MOUTH EVERY DAY 90 capsule 3   ONETOUCH DELICA LANCETS 33G MISC Use as directed once daily.  E11.9 100 each 1   OVER THE COUNTER MEDICATION Allergy relief one a day     Polyethyl Glycol-Propyl Glycol (SYSTANE OP) Apply to eye. As needed     simvastatin  (ZOCOR ) 40 MG tablet Take 40 mg by mouth at bedtime.     sodium chloride  (OCEAN) 0.65 % SOLN nasal spray Place 1 spray into both nostrils as needed for congestion.     Vibegron (GEMTESA) 75 MG TABS Take by mouth daily.     Vitamin D , Ergocalciferol , (DRISDOL ) 50000 units CAPS capsule Take 1 capsule (50,000 Units total) by mouth every 7 (seven) days. Mondays 4 capsule 4   No current facility-administered medications for this visit.    Allergies as of 11/22/2024 - Review Complete 11/22/2024  Allergen Reaction Noted   Codeine Itching 01/22/2011   Tetanus toxoid Swelling    Adhesive [tape] Rash 09/18/2013    Social History   Socioeconomic History   Marital status: Divorced    Spouse name: Not on file   Number of children: Not on file   Years  of education: Not on file   Highest education level: Not on file  Occupational History   Not on file  Tobacco Use   Smoking status: Never   Smokeless tobacco: Never  Vaping Use   Vaping status: Never Used  Substance and Sexual Activity   Alcohol use: No    Alcohol/week: 0.0 standard drinks of alcohol    Comment: No intake of alcohol in greater than 10 yrs   Drug use: No   Sexual activity: Not on file  Other Topics Concern   Not on file  Social History Narrative   Not on file   Social Drivers of Health   Financial Resource Strain: Not on file  Food Insecurity: Not on file  Transportation Needs: Not on file  Physical Activity: Not on file  Stress: Not on file  Social Connections: Not on file    Review of systems General: negative for malaise, night sweats, fever, chills, weight los Neck: Negative for lumps, goiter, pain and significant neck swelling Resp: Negative for cough, wheezing, dyspnea at rest CV: Negative for chest pain, leg swelling, palpitations, orthopnea GI: denies melena, hematochezia, nausea, vomiting, constipation, dysphagia, odyonophagia, early satiety or unintentional weight loss. +diarrhea  MSK: Negative for joint pain or swelling, back pain, and muscle pain. Derm: Negative for itching or rash Psych: Denies depression, anxiety, memory loss, confusion. No homicidal or suicidal ideation.  Heme: Negative for prolonged bleeding, bruising easily, and swollen nodes. Endocrine: Negative for cold or heat intolerance, polyuria, polydipsia and  goiter. Neuro: negative for tremor, gait imbalance, syncope and seizures. The remainder of the review of systems is noncontributory.  Physical Exam: BP 104/69   Pulse 80   Temp (!) 97.1 F (36.2 C)   Ht 5' 3.5 (1.613 m)   Wt 162 lb 9.6 oz (73.8 kg)   BMI 28.35 kg/m  General:   Alert and oriented. No distress noted. Pleasant and cooperative.  Head:  Normocephalic and atraumatic. Eyes:  Conjuctiva clear without  scleral icterus. Mouth:  Oral mucosa pink and moist. Good dentition. No lesions. Heart: Normal rate and rhythm, s1 and s2 heart sounds present.  Lungs: Clear lung sounds in all lobes. Respirations equal and unlabored. Abdomen:  +BS, soft, non-tender and non-distended. No rebound or guarding. No HSM or masses noted. Derm: No palmar erythema or jaundice Msk:  Symmetrical without gross deformities. Normal posture. Extremities:  Without edema. Neurologic:  Alert and  oriented x4 Psych:  Alert and cooperative. Normal mood and affect.  Invalid input(s): 6 MONTHS   ASSESSMENT: Amanda Carlson is a 78 y.o. female presenting today for follow up of Cirrhosis and EPI  Cirrhosis: secondary to [facility],  remains well compensated, last EGD may 2025 with grade I and II EVs, maintained on coreg  BID which was increased  to 6.25mg  BID at last visit as HR was not at goal, HR today still above goal of 60 or less but BP 104/69, will continue with current dose of NSBB for now. She is due for [facility] screening which is scheduled for 12/17 and MELD labs/AFP, last MELD 3.0 in November was low at 7. No signs of HE. Denies any ascites/LE edema.   EPI:  pancreatic elastase low at 178 in November 2024, she has had some improvement with creon  which was increased to 108k units with meals and 72k units with snacks. She is having more formed stools but still some episodes of watery stools with incontinence. Taking metamucil daily which has also helped some, using imodium PRN. She is still taking creon  prior to eating. We discussed again the importance of taking creon  with her meals. For now, will hold off on dosage increase and I encouraged her to take creon  as she begins eating, if she continues to have diarrhea with correct timing, will increase to 144k units with meals and 108 units with snacks given we still have room to increase based on weight. She inquired today if she should be worried about something such as a cancer causing  her diarrhea. We did discuss that last colonoscopy was in 2006/negative cologuard in 2023. She has no rectal bleeding, melena, weight loss and had abnormal pancreatic elastase with response to creon , at this time I feel her symptoms are related to EPI, however, colon cancer cannot 100% be ruled out without evaluation via colonoscopy. Patient verbalized understanding of this.     PLAN:  -Reduce salt intake to <2 g per day - Can take Tylenol  max of 2 g per day (650 mg q8h) for pain - Avoid NSAIDs for pain - Avoid eating raw oysters/shellfish -protein shake each night before bed  -continue creon   at 108k units/72k units, take as you start to eat, will continue with current dose and then go up in dose if timing of creon  does not provide more improvement  -continue coreg  6.25mg  BID -keep schedule US  on 12/17 for HCC screening -MELD labs, AFP, CBC   All questions were answered, patient verbalized understanding and is in agreement with plan as outlined above.  Follow Up: 6 months   Ranessa Kosta L. Mariette, MSN, APRN, AGNP-C Adult-Gerontology Nurse Practitioner [facility]  I have reviewed the note and agree with the APP's assessment as described in this progress note  Toribio Fortune, MD Gastroenterology and Hepatology [facility]

## 2024-11-22 NOTE — Patient Instructions (Addendum)
-  Reduce salt intake to <2 g per day - Can take Tylenol  max of 2 g per day (650 mg q8h) for pain - Avoid NSAIDs for pain - Avoid eating raw oysters/shellfish -protein shake each night before bed  -continue creon , 3 with meals and 2 with snacks, make sure to take this as you start to eat, timing can make a big difference, let me know how you are doing in 2-3 weeks, if still having diarrhea, we can increase the creon  dose -you can use imodium as needed -continue metamucil  -continue coreg  6.25mg  twice daily  -keep schedule US  on 12/17  -will update liver labs    Follow up 6 months

## 2024-12-05 ENCOUNTER — Ambulatory Visit (HOSPITAL_COMMUNITY): Admission: RE | Admit: 2024-12-05 | Discharge: 2024-12-05 | Attending: Gastroenterology

## 2024-12-05 ENCOUNTER — Ambulatory Visit (INDEPENDENT_AMBULATORY_CARE_PROVIDER_SITE_OTHER): Payer: Self-pay | Admitting: Gastroenterology

## 2024-12-05 ENCOUNTER — Other Ambulatory Visit (INDEPENDENT_AMBULATORY_CARE_PROVIDER_SITE_OTHER): Payer: Self-pay | Admitting: Gastroenterology

## 2024-12-05 ENCOUNTER — Inpatient Hospital Stay (HOSPITAL_COMMUNITY): Admission: RE | Admit: 2024-12-05 | Discharge: 2024-12-05 | Attending: Internal Medicine | Admitting: Internal Medicine

## 2024-12-05 DIAGNOSIS — K7469 Other cirrhosis of liver: Secondary | ICD-10-CM | POA: Insufficient documentation

## 2024-12-05 DIAGNOSIS — Z1231 Encounter for screening mammogram for malignant neoplasm of breast: Secondary | ICD-10-CM

## 2024-12-05 LAB — CBC
HCT: 41.6 % (ref 35.9–46.0)
Hemoglobin: 13.4 g/dL (ref 11.7–15.5)
MCH: 28.9 pg (ref 27.0–33.0)
MCHC: 32.2 g/dL (ref 31.6–35.4)
MCV: 89.8 fL (ref 81.4–101.7)
MPV: 11.6 fL (ref 7.5–12.5)
Platelets: 182 Thousand/uL (ref 140–400)
RBC: 4.63 Million/uL (ref 3.80–5.10)
RDW: 14.5 % (ref 11.0–15.0)
WBC: 6.4 Thousand/uL (ref 3.8–10.8)

## 2024-12-05 LAB — PROTIME-INR
INR: 1.1
Prothrombin Time: 11.4 s (ref 9.0–11.5)

## 2024-12-05 LAB — AFP TUMOR MARKER: AFP-Tumor Marker: 2.2 ng/mL

## 2024-12-07 NOTE — Progress Notes (Signed)
Labs to patient.  

## 2024-12-11 ENCOUNTER — Ambulatory Visit (INDEPENDENT_AMBULATORY_CARE_PROVIDER_SITE_OTHER): Payer: Self-pay | Admitting: Gastroenterology

## 2024-12-12 NOTE — Progress Notes (Signed)
 6 mth US  noted in recall

## 2024-12-19 LAB — COMPREHENSIVE METABOLIC PANEL WITH GFR
AG Ratio: 1.9 (calc) (ref 1.0–2.5)
ALT: 32 U/L — ABNORMAL HIGH (ref 6–29)
AST: 29 U/L (ref 10–35)
Albumin: 4.7 g/dL (ref 3.6–5.1)
Alkaline phosphatase (APISO): 73 U/L (ref 37–153)
BUN: 21 mg/dL (ref 7–25)
CO2: 28 mmol/L (ref 20–32)
Calcium: 9.8 mg/dL (ref 8.6–10.4)
Chloride: 102 mmol/L (ref 98–110)
Creat: 0.6 mg/dL (ref 0.60–1.00)
Globulin: 2.5 g/dL (ref 1.9–3.7)
Glucose, Bld: 154 mg/dL — ABNORMAL HIGH (ref 65–99)
Potassium: 4.2 mmol/L (ref 3.5–5.3)
Sodium: 141 mmol/L (ref 135–146)
Total Bilirubin: 0.5 mg/dL (ref 0.2–1.2)
Total Protein: 7.2 g/dL (ref 6.1–8.1)
eGFR: 91 mL/min/1.73m2

## 2024-12-25 ENCOUNTER — Ambulatory Visit (INDEPENDENT_AMBULATORY_CARE_PROVIDER_SITE_OTHER): Payer: Self-pay | Admitting: Gastroenterology

## 2024-12-25 NOTE — Progress Notes (Signed)
 Labs mailed to patient.
